# Patient Record
Sex: Female | Born: 1935 | Race: White | Hispanic: No
Health system: Southern US, Community
[De-identification: ages and names within clinical notes are randomized; demographics above are authoritative.]

## PROBLEM LIST (undated history)

## (undated) DIAGNOSIS — G458 Other transient cerebral ischemic attacks and related syndromes: Secondary | ICD-10-CM

## (undated) DIAGNOSIS — M1990 Unspecified osteoarthritis, unspecified site: Secondary | ICD-10-CM

## (undated) DIAGNOSIS — K5790 Diverticulosis of intestine, part unspecified, without perforation or abscess without bleeding: Secondary | ICD-10-CM

## (undated) DIAGNOSIS — I639 Cerebral infarction, unspecified: Secondary | ICD-10-CM

## (undated) DIAGNOSIS — M810 Age-related osteoporosis without current pathological fracture: Secondary | ICD-10-CM

## (undated) DIAGNOSIS — E669 Obesity, unspecified: Secondary | ICD-10-CM

## (undated) DIAGNOSIS — R600 Localized edema: Secondary | ICD-10-CM

## (undated) DIAGNOSIS — M48061 Spinal stenosis, lumbar region without neurogenic claudication: Secondary | ICD-10-CM

## (undated) DIAGNOSIS — C801 Malignant (primary) neoplasm, unspecified: Secondary | ICD-10-CM

## (undated) HISTORY — PX: COLONOSCOPY W/ POLYPECTOMY: SHX1380

## (undated) HISTORY — DX: Unspecified osteoarthritis, unspecified site: M19.90

## (undated) HISTORY — DX: Diverticulosis of intestine, part unspecified, without perforation or abscess without bleeding: K57.90

## (undated) HISTORY — DX: Malignant (primary) neoplasm, unspecified: C80.1

## (undated) HISTORY — DX: Other transient cerebral ischemic attacks and related syndromes: G45.8

## (undated) HISTORY — DX: Obesity, unspecified: E66.9

## (undated) HISTORY — DX: Age-related osteoporosis without current pathological fracture: M81.0

## (undated) HISTORY — DX: Localized edema: R60.0

## (undated) HISTORY — DX: Cerebral infarction, unspecified: I63.9

## (undated) HISTORY — DX: Spinal stenosis, lumbar region without neurogenic claudication: M48.061

---

## 1973-05-26 HISTORY — PX: BREAST BIOPSY: SHX20

## 2001-05-26 DIAGNOSIS — G458 Other transient cerebral ischemic attacks and related syndromes: Secondary | ICD-10-CM

## 2001-05-26 HISTORY — DX: Other transient cerebral ischemic attacks and related syndromes: G45.8

## 2005-09-23 ENCOUNTER — Ambulatory Visit: Payer: Self-pay | Admitting: Family Medicine

## 2007-12-07 ENCOUNTER — Ambulatory Visit: Payer: Self-pay | Admitting: Unknown Physician Specialty

## 2009-05-26 HISTORY — PX: JOINT REPLACEMENT: SHX530

## 2009-09-04 ENCOUNTER — Ambulatory Visit: Payer: Self-pay | Admitting: Family Medicine

## 2010-01-03 ENCOUNTER — Ambulatory Visit: Payer: Self-pay | Admitting: General Practice

## 2010-01-16 ENCOUNTER — Encounter: Admission: RE | Admit: 2010-01-16 | Discharge: 2010-01-16 | Payer: Self-pay | Admitting: Orthopedic Surgery

## 2010-02-11 ENCOUNTER — Ambulatory Visit: Payer: Self-pay | Admitting: General Practice

## 2010-02-22 ENCOUNTER — Ambulatory Visit: Payer: Self-pay | Admitting: Cardiovascular Disease

## 2010-02-25 ENCOUNTER — Inpatient Hospital Stay: Payer: Self-pay | Admitting: General Practice

## 2010-03-01 ENCOUNTER — Encounter: Payer: Self-pay | Admitting: Internal Medicine

## 2010-04-16 ENCOUNTER — Encounter: Admission: RE | Admit: 2010-04-16 | Discharge: 2010-04-16 | Payer: Self-pay | Admitting: Orthopedic Surgery

## 2010-06-26 ENCOUNTER — Other Ambulatory Visit: Payer: Self-pay | Admitting: Orthopedic Surgery

## 2010-06-26 DIAGNOSIS — M4800 Spinal stenosis, site unspecified: Secondary | ICD-10-CM

## 2010-07-09 ENCOUNTER — Ambulatory Visit
Admission: RE | Admit: 2010-07-09 | Discharge: 2010-07-09 | Disposition: A | Discharge status: Home / Self Care | Payer: Medicare Other | Source: Ambulatory Visit | Attending: Orthopedic Surgery | Admitting: Orthopedic Surgery

## 2010-07-09 ENCOUNTER — Other Ambulatory Visit: Payer: Self-pay

## 2010-07-09 DIAGNOSIS — M4800 Spinal stenosis, site unspecified: Secondary | ICD-10-CM

## 2010-10-01 ENCOUNTER — Ambulatory Visit: Payer: Self-pay | Admitting: Family Medicine

## 2011-02-19 ENCOUNTER — Other Ambulatory Visit: Payer: Self-pay | Admitting: Orthopedic Surgery

## 2011-02-19 DIAGNOSIS — M4800 Spinal stenosis, site unspecified: Secondary | ICD-10-CM

## 2011-02-25 ENCOUNTER — Ambulatory Visit
Admission: RE | Admit: 2011-02-25 | Discharge: 2011-02-25 | Disposition: A | Discharge status: Home / Self Care | Payer: Medicare Other | Source: Ambulatory Visit | Attending: Orthopedic Surgery | Admitting: Orthopedic Surgery

## 2011-02-25 DIAGNOSIS — M4800 Spinal stenosis, site unspecified: Secondary | ICD-10-CM

## 2011-02-25 MED ORDER — METHYLPREDNISOLONE ACETATE 40 MG/ML INJ SUSP (RADIOLOG
120.0000 mg | Freq: Once | INTRAMUSCULAR | Status: AC
  Administered 2011-02-25: 120 mg via EPIDURAL

## 2011-02-25 MED ORDER — IOHEXOL 180 MG/ML  SOLN
1.0000 mL | Freq: Once | INTRAMUSCULAR | Status: AC | PRN
  Administered 2011-02-25: 1 mL via EPIDURAL

## 2011-09-10 DIAGNOSIS — Z0000 Encounter for general adult medical examination without abnormal findings: Secondary | ICD-10-CM | POA: Diagnosis not present

## 2011-09-10 DIAGNOSIS — I10 Essential (primary) hypertension: Secondary | ICD-10-CM | POA: Diagnosis not present

## 2011-09-15 DIAGNOSIS — H2510 Age-related nuclear cataract, unspecified eye: Secondary | ICD-10-CM | POA: Diagnosis not present

## 2011-09-16 DIAGNOSIS — Z0000 Encounter for general adult medical examination without abnormal findings: Secondary | ICD-10-CM | POA: Diagnosis not present

## 2011-09-16 DIAGNOSIS — G458 Other transient cerebral ischemic attacks and related syndromes: Secondary | ICD-10-CM | POA: Diagnosis not present

## 2011-09-16 DIAGNOSIS — M48061 Spinal stenosis, lumbar region without neurogenic claudication: Secondary | ICD-10-CM | POA: Diagnosis not present

## 2011-10-02 ENCOUNTER — Ambulatory Visit: Payer: Self-pay | Admitting: Family Medicine

## 2011-10-02 DIAGNOSIS — Z1231 Encounter for screening mammogram for malignant neoplasm of breast: Secondary | ICD-10-CM | POA: Diagnosis not present

## 2011-10-14 ENCOUNTER — Ambulatory Visit: Payer: Self-pay | Admitting: Family Medicine

## 2011-10-14 DIAGNOSIS — N63 Unspecified lump in unspecified breast: Secondary | ICD-10-CM | POA: Diagnosis not present

## 2011-10-14 DIAGNOSIS — N6489 Other specified disorders of breast: Secondary | ICD-10-CM | POA: Diagnosis not present

## 2011-10-14 DIAGNOSIS — R928 Other abnormal and inconclusive findings on diagnostic imaging of breast: Secondary | ICD-10-CM | POA: Diagnosis not present

## 2011-10-30 DIAGNOSIS — N6019 Diffuse cystic mastopathy of unspecified breast: Secondary | ICD-10-CM | POA: Diagnosis not present

## 2011-10-30 DIAGNOSIS — N63 Unspecified lump in unspecified breast: Secondary | ICD-10-CM | POA: Diagnosis not present

## 2012-03-16 DIAGNOSIS — I10 Essential (primary) hypertension: Secondary | ICD-10-CM | POA: Diagnosis not present

## 2012-03-16 DIAGNOSIS — Z23 Encounter for immunization: Secondary | ICD-10-CM | POA: Diagnosis not present

## 2012-03-25 DIAGNOSIS — D1801 Hemangioma of skin and subcutaneous tissue: Secondary | ICD-10-CM | POA: Diagnosis not present

## 2012-03-25 DIAGNOSIS — L570 Actinic keratosis: Secondary | ICD-10-CM | POA: Diagnosis not present

## 2012-03-25 DIAGNOSIS — D234 Other benign neoplasm of skin of scalp and neck: Secondary | ICD-10-CM | POA: Diagnosis not present

## 2012-04-27 ENCOUNTER — Ambulatory Visit: Payer: Self-pay | Admitting: General Surgery

## 2012-04-27 DIAGNOSIS — N6019 Diffuse cystic mastopathy of unspecified breast: Secondary | ICD-10-CM | POA: Diagnosis not present

## 2012-04-27 DIAGNOSIS — N6489 Other specified disorders of breast: Secondary | ICD-10-CM | POA: Diagnosis not present

## 2012-05-05 ENCOUNTER — Ambulatory Visit: Payer: Self-pay | Admitting: Family Medicine

## 2012-05-05 DIAGNOSIS — M5137 Other intervertebral disc degeneration, lumbosacral region: Secondary | ICD-10-CM | POA: Diagnosis not present

## 2012-05-05 DIAGNOSIS — M47817 Spondylosis without myelopathy or radiculopathy, lumbosacral region: Secondary | ICD-10-CM | POA: Diagnosis not present

## 2012-05-05 DIAGNOSIS — M48061 Spinal stenosis, lumbar region without neurogenic claudication: Secondary | ICD-10-CM | POA: Diagnosis not present

## 2012-05-05 DIAGNOSIS — I10 Essential (primary) hypertension: Secondary | ICD-10-CM | POA: Diagnosis not present

## 2012-05-07 ENCOUNTER — Ambulatory Visit: Payer: Self-pay | Admitting: Family Medicine

## 2012-05-07 DIAGNOSIS — M5137 Other intervertebral disc degeneration, lumbosacral region: Secondary | ICD-10-CM | POA: Diagnosis not present

## 2012-05-07 DIAGNOSIS — M5126 Other intervertebral disc displacement, lumbar region: Secondary | ICD-10-CM | POA: Diagnosis not present

## 2012-05-11 ENCOUNTER — Other Ambulatory Visit: Payer: Self-pay | Admitting: Family Medicine

## 2012-05-11 DIAGNOSIS — M79604 Pain in right leg: Secondary | ICD-10-CM

## 2012-05-20 ENCOUNTER — Ambulatory Visit
Admission: RE | Admit: 2012-05-20 | Discharge: 2012-05-20 | Disposition: A | Discharge status: Home / Self Care | Payer: Medicare Other | Source: Ambulatory Visit | Attending: Family Medicine | Admitting: Family Medicine

## 2012-05-20 VITALS — BP 152/93 | HR 85

## 2012-05-20 DIAGNOSIS — M47817 Spondylosis without myelopathy or radiculopathy, lumbosacral region: Secondary | ICD-10-CM | POA: Diagnosis not present

## 2012-05-20 DIAGNOSIS — M79604 Pain in right leg: Secondary | ICD-10-CM

## 2012-05-20 DIAGNOSIS — M48061 Spinal stenosis, lumbar region without neurogenic claudication: Secondary | ICD-10-CM | POA: Diagnosis not present

## 2012-05-20 DIAGNOSIS — M5126 Other intervertebral disc displacement, lumbar region: Secondary | ICD-10-CM

## 2012-05-20 MED ORDER — IOHEXOL 180 MG/ML  SOLN
1.0000 mL | Freq: Once | INTRAMUSCULAR | Status: AC | PRN
  Administered 2012-05-20: 1 mL via EPIDURAL

## 2012-05-20 MED ORDER — METHYLPREDNISOLONE ACETATE 40 MG/ML INJ SUSP (RADIOLOG
120.0000 mg | Freq: Once | INTRAMUSCULAR | Status: AC
  Administered 2012-05-20: 120 mg via EPIDURAL

## 2012-06-09 DIAGNOSIS — M5137 Other intervertebral disc degeneration, lumbosacral region: Secondary | ICD-10-CM | POA: Diagnosis not present

## 2012-06-09 DIAGNOSIS — N6019 Diffuse cystic mastopathy of unspecified breast: Secondary | ICD-10-CM | POA: Diagnosis not present

## 2012-06-10 ENCOUNTER — Other Ambulatory Visit: Payer: Self-pay | Admitting: Family Medicine

## 2012-06-10 DIAGNOSIS — M549 Dorsalgia, unspecified: Secondary | ICD-10-CM

## 2012-06-23 ENCOUNTER — Inpatient Hospital Stay: Admission: RE | Admit: 2012-06-23 | Payer: Medicare Other | Source: Ambulatory Visit

## 2012-07-12 DIAGNOSIS — D485 Neoplasm of uncertain behavior of skin: Secondary | ICD-10-CM | POA: Diagnosis not present

## 2012-07-12 DIAGNOSIS — C4441 Basal cell carcinoma of skin of scalp and neck: Secondary | ICD-10-CM | POA: Diagnosis not present

## 2012-09-21 DIAGNOSIS — Z0000 Encounter for general adult medical examination without abnormal findings: Secondary | ICD-10-CM | POA: Diagnosis not present

## 2012-09-21 DIAGNOSIS — I10 Essential (primary) hypertension: Secondary | ICD-10-CM | POA: Diagnosis not present

## 2012-10-19 DIAGNOSIS — R234 Changes in skin texture: Secondary | ICD-10-CM | POA: Diagnosis not present

## 2012-11-30 DIAGNOSIS — R21 Rash and other nonspecific skin eruption: Secondary | ICD-10-CM | POA: Diagnosis not present

## 2012-11-30 DIAGNOSIS — R42 Dizziness and giddiness: Secondary | ICD-10-CM | POA: Diagnosis not present

## 2012-12-21 DIAGNOSIS — R262 Difficulty in walking, not elsewhere classified: Secondary | ICD-10-CM | POA: Diagnosis not present

## 2012-12-21 DIAGNOSIS — M48061 Spinal stenosis, lumbar region without neurogenic claudication: Secondary | ICD-10-CM | POA: Diagnosis not present

## 2012-12-24 DIAGNOSIS — M48061 Spinal stenosis, lumbar region without neurogenic claudication: Secondary | ICD-10-CM | POA: Diagnosis not present

## 2012-12-24 DIAGNOSIS — R262 Difficulty in walking, not elsewhere classified: Secondary | ICD-10-CM | POA: Diagnosis not present

## 2013-01-24 DIAGNOSIS — M48061 Spinal stenosis, lumbar region without neurogenic claudication: Secondary | ICD-10-CM | POA: Diagnosis not present

## 2013-01-24 DIAGNOSIS — R262 Difficulty in walking, not elsewhere classified: Secondary | ICD-10-CM | POA: Diagnosis not present

## 2013-01-25 DIAGNOSIS — M48061 Spinal stenosis, lumbar region without neurogenic claudication: Secondary | ICD-10-CM | POA: Diagnosis not present

## 2013-01-25 DIAGNOSIS — R262 Difficulty in walking, not elsewhere classified: Secondary | ICD-10-CM | POA: Diagnosis not present

## 2013-01-27 DIAGNOSIS — Z23 Encounter for immunization: Secondary | ICD-10-CM | POA: Diagnosis not present

## 2013-01-27 DIAGNOSIS — R234 Changes in skin texture: Secondary | ICD-10-CM | POA: Diagnosis not present

## 2013-01-27 DIAGNOSIS — I10 Essential (primary) hypertension: Secondary | ICD-10-CM | POA: Diagnosis not present

## 2013-01-31 DIAGNOSIS — R262 Difficulty in walking, not elsewhere classified: Secondary | ICD-10-CM | POA: Diagnosis not present

## 2013-01-31 DIAGNOSIS — M48061 Spinal stenosis, lumbar region without neurogenic claudication: Secondary | ICD-10-CM | POA: Diagnosis not present

## 2013-02-02 DIAGNOSIS — C4441 Basal cell carcinoma of skin of scalp and neck: Secondary | ICD-10-CM | POA: Diagnosis not present

## 2013-02-02 DIAGNOSIS — D485 Neoplasm of uncertain behavior of skin: Secondary | ICD-10-CM | POA: Diagnosis not present

## 2013-02-04 DIAGNOSIS — R262 Difficulty in walking, not elsewhere classified: Secondary | ICD-10-CM | POA: Diagnosis not present

## 2013-02-04 DIAGNOSIS — M48061 Spinal stenosis, lumbar region without neurogenic claudication: Secondary | ICD-10-CM | POA: Diagnosis not present

## 2013-02-08 DIAGNOSIS — R262 Difficulty in walking, not elsewhere classified: Secondary | ICD-10-CM | POA: Diagnosis not present

## 2013-02-08 DIAGNOSIS — M48061 Spinal stenosis, lumbar region without neurogenic claudication: Secondary | ICD-10-CM | POA: Diagnosis not present

## 2013-02-11 DIAGNOSIS — R262 Difficulty in walking, not elsewhere classified: Secondary | ICD-10-CM | POA: Diagnosis not present

## 2013-02-11 DIAGNOSIS — M48061 Spinal stenosis, lumbar region without neurogenic claudication: Secondary | ICD-10-CM | POA: Diagnosis not present

## 2013-02-15 DIAGNOSIS — R262 Difficulty in walking, not elsewhere classified: Secondary | ICD-10-CM | POA: Diagnosis not present

## 2013-02-15 DIAGNOSIS — M48061 Spinal stenosis, lumbar region without neurogenic claudication: Secondary | ICD-10-CM | POA: Diagnosis not present

## 2013-02-18 DIAGNOSIS — R262 Difficulty in walking, not elsewhere classified: Secondary | ICD-10-CM | POA: Diagnosis not present

## 2013-02-18 DIAGNOSIS — M48061 Spinal stenosis, lumbar region without neurogenic claudication: Secondary | ICD-10-CM | POA: Diagnosis not present

## 2013-02-22 DIAGNOSIS — M48061 Spinal stenosis, lumbar region without neurogenic claudication: Secondary | ICD-10-CM | POA: Diagnosis not present

## 2013-02-22 DIAGNOSIS — R262 Difficulty in walking, not elsewhere classified: Secondary | ICD-10-CM | POA: Diagnosis not present

## 2013-02-23 DIAGNOSIS — M48061 Spinal stenosis, lumbar region without neurogenic claudication: Secondary | ICD-10-CM | POA: Diagnosis not present

## 2013-02-23 DIAGNOSIS — C4441 Basal cell carcinoma of skin of scalp and neck: Secondary | ICD-10-CM | POA: Diagnosis not present

## 2013-02-23 DIAGNOSIS — R262 Difficulty in walking, not elsewhere classified: Secondary | ICD-10-CM | POA: Diagnosis not present

## 2013-03-26 DIAGNOSIS — R262 Difficulty in walking, not elsewhere classified: Secondary | ICD-10-CM | POA: Diagnosis not present

## 2013-03-26 DIAGNOSIS — M48061 Spinal stenosis, lumbar region without neurogenic claudication: Secondary | ICD-10-CM | POA: Diagnosis not present

## 2013-04-25 DIAGNOSIS — M48061 Spinal stenosis, lumbar region without neurogenic claudication: Secondary | ICD-10-CM | POA: Diagnosis not present

## 2013-04-25 DIAGNOSIS — R262 Difficulty in walking, not elsewhere classified: Secondary | ICD-10-CM | POA: Diagnosis not present

## 2013-05-02 DIAGNOSIS — I10 Essential (primary) hypertension: Secondary | ICD-10-CM | POA: Diagnosis not present

## 2013-05-02 DIAGNOSIS — R609 Edema, unspecified: Secondary | ICD-10-CM | POA: Diagnosis not present

## 2013-05-02 DIAGNOSIS — R234 Changes in skin texture: Secondary | ICD-10-CM | POA: Diagnosis not present

## 2013-05-26 DIAGNOSIS — M48061 Spinal stenosis, lumbar region without neurogenic claudication: Secondary | ICD-10-CM | POA: Diagnosis not present

## 2013-05-26 DIAGNOSIS — R262 Difficulty in walking, not elsewhere classified: Secondary | ICD-10-CM | POA: Diagnosis not present

## 2013-06-26 DIAGNOSIS — R262 Difficulty in walking, not elsewhere classified: Secondary | ICD-10-CM | POA: Diagnosis not present

## 2013-06-26 DIAGNOSIS — M48061 Spinal stenosis, lumbar region without neurogenic claudication: Secondary | ICD-10-CM | POA: Diagnosis not present

## 2013-07-18 DIAGNOSIS — H2510 Age-related nuclear cataract, unspecified eye: Secondary | ICD-10-CM | POA: Diagnosis not present

## 2013-07-24 DIAGNOSIS — M48061 Spinal stenosis, lumbar region without neurogenic claudication: Secondary | ICD-10-CM | POA: Diagnosis not present

## 2013-07-24 DIAGNOSIS — R262 Difficulty in walking, not elsewhere classified: Secondary | ICD-10-CM | POA: Diagnosis not present

## 2013-07-27 DIAGNOSIS — M48061 Spinal stenosis, lumbar region without neurogenic claudication: Secondary | ICD-10-CM | POA: Diagnosis not present

## 2013-07-27 DIAGNOSIS — R262 Difficulty in walking, not elsewhere classified: Secondary | ICD-10-CM | POA: Diagnosis not present

## 2013-07-29 DIAGNOSIS — M48061 Spinal stenosis, lumbar region without neurogenic claudication: Secondary | ICD-10-CM | POA: Diagnosis not present

## 2013-07-29 DIAGNOSIS — R262 Difficulty in walking, not elsewhere classified: Secondary | ICD-10-CM | POA: Diagnosis not present

## 2013-08-03 DIAGNOSIS — M48061 Spinal stenosis, lumbar region without neurogenic claudication: Secondary | ICD-10-CM | POA: Diagnosis not present

## 2013-08-03 DIAGNOSIS — R262 Difficulty in walking, not elsewhere classified: Secondary | ICD-10-CM | POA: Diagnosis not present

## 2013-08-05 DIAGNOSIS — M48061 Spinal stenosis, lumbar region without neurogenic claudication: Secondary | ICD-10-CM | POA: Diagnosis not present

## 2013-08-05 DIAGNOSIS — R262 Difficulty in walking, not elsewhere classified: Secondary | ICD-10-CM | POA: Diagnosis not present

## 2013-08-09 DIAGNOSIS — M48061 Spinal stenosis, lumbar region without neurogenic claudication: Secondary | ICD-10-CM | POA: Diagnosis not present

## 2013-08-09 DIAGNOSIS — R262 Difficulty in walking, not elsewhere classified: Secondary | ICD-10-CM | POA: Diagnosis not present

## 2013-08-12 DIAGNOSIS — M48061 Spinal stenosis, lumbar region without neurogenic claudication: Secondary | ICD-10-CM | POA: Diagnosis not present

## 2013-08-12 DIAGNOSIS — R262 Difficulty in walking, not elsewhere classified: Secondary | ICD-10-CM | POA: Diagnosis not present

## 2013-09-05 DIAGNOSIS — H25019 Cortical age-related cataract, unspecified eye: Secondary | ICD-10-CM | POA: Diagnosis not present

## 2013-09-05 DIAGNOSIS — H2510 Age-related nuclear cataract, unspecified eye: Secondary | ICD-10-CM | POA: Diagnosis not present

## 2013-09-05 DIAGNOSIS — H43819 Vitreous degeneration, unspecified eye: Secondary | ICD-10-CM | POA: Diagnosis not present

## 2013-09-05 DIAGNOSIS — H524 Presbyopia: Secondary | ICD-10-CM | POA: Diagnosis not present

## 2013-09-26 DIAGNOSIS — Z0000 Encounter for general adult medical examination without abnormal findings: Secondary | ICD-10-CM | POA: Diagnosis not present

## 2013-09-26 DIAGNOSIS — I10 Essential (primary) hypertension: Secondary | ICD-10-CM | POA: Diagnosis not present

## 2014-04-10 DIAGNOSIS — G458 Other transient cerebral ischemic attacks and related syndromes: Secondary | ICD-10-CM | POA: Diagnosis not present

## 2014-04-10 DIAGNOSIS — Z23 Encounter for immunization: Secondary | ICD-10-CM | POA: Diagnosis not present

## 2014-04-10 DIAGNOSIS — I10 Essential (primary) hypertension: Secondary | ICD-10-CM | POA: Diagnosis not present

## 2014-04-10 DIAGNOSIS — M810 Age-related osteoporosis without current pathological fracture: Secondary | ICD-10-CM | POA: Diagnosis not present

## 2014-04-10 DIAGNOSIS — M1990 Unspecified osteoarthritis, unspecified site: Secondary | ICD-10-CM | POA: Diagnosis not present

## 2014-05-26 HISTORY — PX: EYE SURGERY: SHX253

## 2014-05-29 DIAGNOSIS — M25511 Pain in right shoulder: Secondary | ICD-10-CM | POA: Diagnosis not present

## 2014-06-08 DIAGNOSIS — M7541 Impingement syndrome of right shoulder: Secondary | ICD-10-CM | POA: Diagnosis not present

## 2014-07-20 DIAGNOSIS — H5703 Miosis: Secondary | ICD-10-CM | POA: Diagnosis not present

## 2014-07-20 DIAGNOSIS — H2513 Age-related nuclear cataract, bilateral: Secondary | ICD-10-CM | POA: Diagnosis not present

## 2014-07-20 DIAGNOSIS — H25013 Cortical age-related cataract, bilateral: Secondary | ICD-10-CM | POA: Diagnosis not present

## 2014-07-20 DIAGNOSIS — H524 Presbyopia: Secondary | ICD-10-CM | POA: Diagnosis not present

## 2014-08-15 DIAGNOSIS — H2512 Age-related nuclear cataract, left eye: Secondary | ICD-10-CM | POA: Diagnosis not present

## 2014-08-21 DIAGNOSIS — H2512 Age-related nuclear cataract, left eye: Secondary | ICD-10-CM | POA: Diagnosis not present

## 2014-09-13 DIAGNOSIS — H25011 Cortical age-related cataract, right eye: Secondary | ICD-10-CM | POA: Diagnosis not present

## 2014-09-13 DIAGNOSIS — H2511 Age-related nuclear cataract, right eye: Secondary | ICD-10-CM | POA: Diagnosis not present

## 2014-09-15 DIAGNOSIS — R531 Weakness: Secondary | ICD-10-CM | POA: Diagnosis not present

## 2014-09-15 DIAGNOSIS — I890 Lymphedema, not elsewhere classified: Secondary | ICD-10-CM | POA: Diagnosis not present

## 2014-09-15 DIAGNOSIS — R262 Difficulty in walking, not elsewhere classified: Secondary | ICD-10-CM | POA: Diagnosis not present

## 2014-09-22 DIAGNOSIS — R262 Difficulty in walking, not elsewhere classified: Secondary | ICD-10-CM | POA: Diagnosis not present

## 2014-09-22 DIAGNOSIS — R531 Weakness: Secondary | ICD-10-CM | POA: Diagnosis not present

## 2014-09-22 DIAGNOSIS — I890 Lymphedema, not elsewhere classified: Secondary | ICD-10-CM | POA: Diagnosis not present

## 2014-09-25 DIAGNOSIS — R531 Weakness: Secondary | ICD-10-CM | POA: Diagnosis not present

## 2014-09-25 DIAGNOSIS — R262 Difficulty in walking, not elsewhere classified: Secondary | ICD-10-CM | POA: Diagnosis not present

## 2014-09-25 DIAGNOSIS — I890 Lymphedema, not elsewhere classified: Secondary | ICD-10-CM | POA: Diagnosis not present

## 2014-09-28 DIAGNOSIS — M1990 Unspecified osteoarthritis, unspecified site: Secondary | ICD-10-CM | POA: Diagnosis not present

## 2014-09-28 DIAGNOSIS — I890 Lymphedema, not elsewhere classified: Secondary | ICD-10-CM | POA: Diagnosis not present

## 2014-09-28 DIAGNOSIS — Z0000 Encounter for general adult medical examination without abnormal findings: Secondary | ICD-10-CM | POA: Diagnosis not present

## 2014-09-28 DIAGNOSIS — G458 Other transient cerebral ischemic attacks and related syndromes: Secondary | ICD-10-CM | POA: Diagnosis not present

## 2014-09-28 DIAGNOSIS — R262 Difficulty in walking, not elsewhere classified: Secondary | ICD-10-CM | POA: Diagnosis not present

## 2014-09-28 DIAGNOSIS — I10 Essential (primary) hypertension: Secondary | ICD-10-CM | POA: Diagnosis not present

## 2014-09-28 DIAGNOSIS — R531 Weakness: Secondary | ICD-10-CM | POA: Diagnosis not present

## 2014-09-28 DIAGNOSIS — E662 Morbid (severe) obesity with alveolar hypoventilation: Secondary | ICD-10-CM | POA: Diagnosis not present

## 2014-10-04 DIAGNOSIS — I890 Lymphedema, not elsewhere classified: Secondary | ICD-10-CM | POA: Diagnosis not present

## 2014-10-04 DIAGNOSIS — R262 Difficulty in walking, not elsewhere classified: Secondary | ICD-10-CM | POA: Diagnosis not present

## 2014-10-04 DIAGNOSIS — R531 Weakness: Secondary | ICD-10-CM | POA: Diagnosis not present

## 2014-10-09 DIAGNOSIS — R262 Difficulty in walking, not elsewhere classified: Secondary | ICD-10-CM | POA: Diagnosis not present

## 2014-10-09 DIAGNOSIS — M7552 Bursitis of left shoulder: Secondary | ICD-10-CM | POA: Diagnosis not present

## 2014-10-09 DIAGNOSIS — R531 Weakness: Secondary | ICD-10-CM | POA: Diagnosis not present

## 2014-10-09 DIAGNOSIS — I890 Lymphedema, not elsewhere classified: Secondary | ICD-10-CM | POA: Diagnosis not present

## 2014-10-12 DIAGNOSIS — R262 Difficulty in walking, not elsewhere classified: Secondary | ICD-10-CM | POA: Diagnosis not present

## 2014-10-12 DIAGNOSIS — I890 Lymphedema, not elsewhere classified: Secondary | ICD-10-CM | POA: Diagnosis not present

## 2014-10-12 DIAGNOSIS — R531 Weakness: Secondary | ICD-10-CM | POA: Diagnosis not present

## 2014-10-16 DIAGNOSIS — R531 Weakness: Secondary | ICD-10-CM | POA: Diagnosis not present

## 2014-10-16 DIAGNOSIS — I890 Lymphedema, not elsewhere classified: Secondary | ICD-10-CM | POA: Diagnosis not present

## 2014-10-16 DIAGNOSIS — R262 Difficulty in walking, not elsewhere classified: Secondary | ICD-10-CM | POA: Diagnosis not present

## 2014-10-17 DIAGNOSIS — I890 Lymphedema, not elsewhere classified: Secondary | ICD-10-CM | POA: Diagnosis not present

## 2014-10-17 DIAGNOSIS — R531 Weakness: Secondary | ICD-10-CM | POA: Diagnosis not present

## 2014-10-17 DIAGNOSIS — R262 Difficulty in walking, not elsewhere classified: Secondary | ICD-10-CM | POA: Diagnosis not present

## 2014-10-19 DIAGNOSIS — R262 Difficulty in walking, not elsewhere classified: Secondary | ICD-10-CM | POA: Diagnosis not present

## 2014-10-19 DIAGNOSIS — R531 Weakness: Secondary | ICD-10-CM | POA: Diagnosis not present

## 2014-10-19 DIAGNOSIS — I890 Lymphedema, not elsewhere classified: Secondary | ICD-10-CM | POA: Diagnosis not present

## 2014-10-24 DIAGNOSIS — R531 Weakness: Secondary | ICD-10-CM | POA: Diagnosis not present

## 2014-10-24 DIAGNOSIS — I890 Lymphedema, not elsewhere classified: Secondary | ICD-10-CM | POA: Diagnosis not present

## 2014-10-24 DIAGNOSIS — R262 Difficulty in walking, not elsewhere classified: Secondary | ICD-10-CM | POA: Diagnosis not present

## 2014-10-26 DIAGNOSIS — R531 Weakness: Secondary | ICD-10-CM | POA: Diagnosis not present

## 2014-10-26 DIAGNOSIS — R262 Difficulty in walking, not elsewhere classified: Secondary | ICD-10-CM | POA: Diagnosis not present

## 2014-10-26 DIAGNOSIS — I890 Lymphedema, not elsewhere classified: Secondary | ICD-10-CM | POA: Diagnosis not present

## 2014-10-31 DIAGNOSIS — H2511 Age-related nuclear cataract, right eye: Secondary | ICD-10-CM | POA: Diagnosis not present

## 2014-11-08 ENCOUNTER — Telehealth: Payer: Self-pay | Admitting: Family Medicine

## 2014-11-08 MED ORDER — RAMIPRIL 2.5 MG PO CAPS: 2.5000 mg | ORAL_CAPSULE | Freq: Every day | ORAL | Status: DC

## 2014-11-08 NOTE — Telephone Encounter (Signed)
Patient called and said she has called twice and no call back has been made. She said she will like to talk to you Izora Gala regarding an issue about BP problem. Please call pt back [phone], thanks.

## 2014-11-10 ENCOUNTER — Telehealth: Payer: Self-pay | Admitting: Family Medicine

## 2014-11-10 ENCOUNTER — Telehealth: Payer: Self-pay

## 2014-11-10 DIAGNOSIS — W57XXXA Bitten or stung by nonvenomous insect and other nonvenomous arthropods, initial encounter: Secondary | ICD-10-CM | POA: Diagnosis not present

## 2014-11-10 DIAGNOSIS — R531 Weakness: Secondary | ICD-10-CM | POA: Diagnosis not present

## 2014-11-10 DIAGNOSIS — S30861A Insect bite (nonvenomous) of abdominal wall, initial encounter: Secondary | ICD-10-CM | POA: Diagnosis not present

## 2014-11-10 MED ORDER — RAMIPRIL 2.5 MG PO CAPS: 2.5000 mg | ORAL_CAPSULE | Freq: Every day | ORAL | Status: DC

## 2014-11-10 NOTE — Telephone Encounter (Signed)
Call was returned to patient, no answer, left message that I checked her chart, her Altace (Ramipril) was filled by MAC on 11/08/14 and sent to [facility].  Advised to contact that pharmacy to see when to expect delivery.

## 2014-11-10 NOTE — Telephone Encounter (Signed)
Rx sent to [facility]

## 2014-11-10 NOTE — Telephone Encounter (Signed)
Pt called and states she needs a refill on BP meds. Pharm is [facility], ask pharmacy to deliver to her home.

## 2014-11-10 NOTE — Telephone Encounter (Signed)
Patient went to another office today where they considered her BP to be too low and told the patient to NOT start taking the Ramapril again.  She is concerned that her BP goes up and down leaving her feeling very tired.  [place]  Please advise if she needs to be worked in or just a phone call will do.

## 2014-11-10 NOTE — Telephone Encounter (Signed)
Patients daughter understands Rx was sent to mail order, patient is out today and they are requesting a fill in Rx for her Ramipril be sent to [facility] today.

## 2014-11-13 NOTE — Telephone Encounter (Signed)
MAC spoke with daughter [name] personally, no need for call.

## 2014-11-13 NOTE — Telephone Encounter (Signed)
Call pt 

## 2014-11-14 DIAGNOSIS — R531 Weakness: Secondary | ICD-10-CM | POA: Diagnosis not present

## 2014-11-14 DIAGNOSIS — I890 Lymphedema, not elsewhere classified: Secondary | ICD-10-CM | POA: Diagnosis not present

## 2014-11-14 DIAGNOSIS — R262 Difficulty in walking, not elsewhere classified: Secondary | ICD-10-CM | POA: Diagnosis not present

## 2014-11-22 ENCOUNTER — Telehealth: Payer: Self-pay

## 2014-11-22 DIAGNOSIS — Z7189 Other specified counseling: Secondary | ICD-10-CM

## 2014-11-22 NOTE — Telephone Encounter (Signed)
Would like to discuss in home Occupational Therapy

## 2014-11-22 NOTE — Telephone Encounter (Signed)
Try again

## 2014-11-23 NOTE — Telephone Encounter (Signed)
Will get occupational therapy to review about maintaining home care

## 2014-11-24 ENCOUNTER — Telehealth: Payer: Self-pay | Admitting: Family Medicine

## 2014-11-24 NOTE — Telephone Encounter (Signed)
Paperwork filled out and signed by MAC for [employer]

## 2014-11-24 NOTE — Telephone Encounter (Signed)
MS HOLT CALLED TO INFORM us OF 2 PLACES THAT OFFERED IN HOME OCCUPATIONAL THERAPY FOR Jennifer Murray. THE 2 PLACES ARE [facility]([phone]) AND THE OTHER PLACE IS [facility]([phone])

## 2014-12-11 ENCOUNTER — Telehealth: Payer: Self-pay

## 2014-12-11 ENCOUNTER — Telehealth: Payer: Self-pay | Admitting: Family Medicine

## 2014-12-11 DIAGNOSIS — M4806 Spinal stenosis, lumbar region: Secondary | ICD-10-CM | POA: Diagnosis not present

## 2014-12-11 DIAGNOSIS — M159 Polyosteoarthritis, unspecified: Secondary | ICD-10-CM | POA: Diagnosis not present

## 2014-12-11 DIAGNOSIS — M6281 Muscle weakness (generalized): Secondary | ICD-10-CM | POA: Diagnosis not present

## 2014-12-11 DIAGNOSIS — Z8673 Personal history of transient ischemic attack (TIA), and cerebral infarction without residual deficits: Secondary | ICD-10-CM | POA: Diagnosis not present

## 2014-12-11 DIAGNOSIS — M810 Age-related osteoporosis without current pathological fracture: Secondary | ICD-10-CM | POA: Diagnosis not present

## 2014-12-11 DIAGNOSIS — I10 Essential (primary) hypertension: Secondary | ICD-10-CM | POA: Diagnosis not present

## 2014-12-11 NOTE — Telephone Encounter (Signed)
Phone call with patient has some questions about taken Ramipril 2.5 mg discussed to take it every day.

## 2014-12-13 ENCOUNTER — Telehealth: Payer: Self-pay

## 2014-12-13 NOTE — Telephone Encounter (Signed)
Called to give verbal order to PT-[facility] @ [phone]. [place] for PT 2x wk also ok for OT- skilled nursing and medical social work.

## 2014-12-14 DIAGNOSIS — M4806 Spinal stenosis, lumbar region: Secondary | ICD-10-CM | POA: Diagnosis not present

## 2014-12-14 DIAGNOSIS — M810 Age-related osteoporosis without current pathological fracture: Secondary | ICD-10-CM | POA: Diagnosis not present

## 2014-12-14 DIAGNOSIS — M6281 Muscle weakness (generalized): Secondary | ICD-10-CM | POA: Diagnosis not present

## 2014-12-14 DIAGNOSIS — M159 Polyosteoarthritis, unspecified: Secondary | ICD-10-CM | POA: Diagnosis not present

## 2014-12-14 DIAGNOSIS — Z8673 Personal history of transient ischemic attack (TIA), and cerebral infarction without residual deficits: Secondary | ICD-10-CM | POA: Diagnosis not present

## 2014-12-14 DIAGNOSIS — I10 Essential (primary) hypertension: Secondary | ICD-10-CM | POA: Diagnosis not present

## 2014-12-14 NOTE — Telephone Encounter (Signed)
Mac spoke with patient

## 2014-12-16 DIAGNOSIS — M159 Polyosteoarthritis, unspecified: Secondary | ICD-10-CM | POA: Diagnosis not present

## 2014-12-16 DIAGNOSIS — I10 Essential (primary) hypertension: Secondary | ICD-10-CM | POA: Diagnosis not present

## 2014-12-16 DIAGNOSIS — M810 Age-related osteoporosis without current pathological fracture: Secondary | ICD-10-CM | POA: Diagnosis not present

## 2014-12-16 DIAGNOSIS — Z8673 Personal history of transient ischemic attack (TIA), and cerebral infarction without residual deficits: Secondary | ICD-10-CM | POA: Diagnosis not present

## 2014-12-16 DIAGNOSIS — M4806 Spinal stenosis, lumbar region: Secondary | ICD-10-CM | POA: Diagnosis not present

## 2014-12-16 DIAGNOSIS — M6281 Muscle weakness (generalized): Secondary | ICD-10-CM | POA: Diagnosis not present

## 2014-12-19 ENCOUNTER — Telehealth: Payer: Self-pay

## 2014-12-19 DIAGNOSIS — M810 Age-related osteoporosis without current pathological fracture: Secondary | ICD-10-CM | POA: Diagnosis not present

## 2014-12-19 DIAGNOSIS — M6281 Muscle weakness (generalized): Secondary | ICD-10-CM | POA: Diagnosis not present

## 2014-12-19 DIAGNOSIS — M159 Polyosteoarthritis, unspecified: Secondary | ICD-10-CM | POA: Diagnosis not present

## 2014-12-19 DIAGNOSIS — Z8673 Personal history of transient ischemic attack (TIA), and cerebral infarction without residual deficits: Secondary | ICD-10-CM | POA: Diagnosis not present

## 2014-12-19 DIAGNOSIS — M4806 Spinal stenosis, lumbar region: Secondary | ICD-10-CM | POA: Diagnosis not present

## 2014-12-19 DIAGNOSIS — I10 Essential (primary) hypertension: Secondary | ICD-10-CM | POA: Diagnosis not present

## 2014-12-19 NOTE — Telephone Encounter (Signed)
Verbal called in to [facility]

## 2014-12-19 NOTE — Telephone Encounter (Signed)
Millie, Medical Social Worker with [facility] would like a verbal ok for one follow up visit for community resources For patient,  Call back [phone]

## 2014-12-19 NOTE — Telephone Encounter (Signed)
OK to do that. Thanks!

## 2014-12-20 ENCOUNTER — Telehealth: Payer: Self-pay

## 2014-12-20 DIAGNOSIS — I10 Essential (primary) hypertension: Secondary | ICD-10-CM | POA: Diagnosis not present

## 2014-12-20 DIAGNOSIS — M6281 Muscle weakness (generalized): Secondary | ICD-10-CM | POA: Diagnosis not present

## 2014-12-20 DIAGNOSIS — M810 Age-related osteoporosis without current pathological fracture: Secondary | ICD-10-CM | POA: Diagnosis not present

## 2014-12-20 DIAGNOSIS — M159 Polyosteoarthritis, unspecified: Secondary | ICD-10-CM | POA: Diagnosis not present

## 2014-12-20 DIAGNOSIS — M4806 Spinal stenosis, lumbar region: Secondary | ICD-10-CM | POA: Diagnosis not present

## 2014-12-20 DIAGNOSIS — Z8673 Personal history of transient ischemic attack (TIA), and cerebral infarction without residual deficits: Secondary | ICD-10-CM | POA: Diagnosis not present

## 2014-12-20 NOTE — Telephone Encounter (Signed)
Verbal ok for OT 2w x3 given by MJ

## 2014-12-21 DIAGNOSIS — Z8673 Personal history of transient ischemic attack (TIA), and cerebral infarction without residual deficits: Secondary | ICD-10-CM | POA: Diagnosis not present

## 2014-12-21 DIAGNOSIS — M4806 Spinal stenosis, lumbar region: Secondary | ICD-10-CM | POA: Diagnosis not present

## 2014-12-21 DIAGNOSIS — M810 Age-related osteoporosis without current pathological fracture: Secondary | ICD-10-CM | POA: Diagnosis not present

## 2014-12-21 DIAGNOSIS — M159 Polyosteoarthritis, unspecified: Secondary | ICD-10-CM | POA: Diagnosis not present

## 2014-12-21 DIAGNOSIS — M6281 Muscle weakness (generalized): Secondary | ICD-10-CM | POA: Diagnosis not present

## 2014-12-21 DIAGNOSIS — I10 Essential (primary) hypertension: Secondary | ICD-10-CM | POA: Diagnosis not present

## 2014-12-22 DIAGNOSIS — M159 Polyosteoarthritis, unspecified: Secondary | ICD-10-CM | POA: Diagnosis not present

## 2014-12-22 DIAGNOSIS — M810 Age-related osteoporosis without current pathological fracture: Secondary | ICD-10-CM | POA: Diagnosis not present

## 2014-12-22 DIAGNOSIS — M6281 Muscle weakness (generalized): Secondary | ICD-10-CM | POA: Diagnosis not present

## 2014-12-22 DIAGNOSIS — I10 Essential (primary) hypertension: Secondary | ICD-10-CM | POA: Diagnosis not present

## 2014-12-22 DIAGNOSIS — M4806 Spinal stenosis, lumbar region: Secondary | ICD-10-CM | POA: Diagnosis not present

## 2014-12-22 DIAGNOSIS — Z8673 Personal history of transient ischemic attack (TIA), and cerebral infarction without residual deficits: Secondary | ICD-10-CM | POA: Diagnosis not present

## 2014-12-25 DIAGNOSIS — R0602 Shortness of breath: Secondary | ICD-10-CM | POA: Diagnosis not present

## 2014-12-25 DIAGNOSIS — I10 Essential (primary) hypertension: Secondary | ICD-10-CM | POA: Diagnosis not present

## 2014-12-25 DIAGNOSIS — I6523 Occlusion and stenosis of bilateral carotid arteries: Secondary | ICD-10-CM | POA: Insufficient documentation

## 2014-12-25 DIAGNOSIS — R600 Localized edema: Secondary | ICD-10-CM | POA: Diagnosis not present

## 2014-12-26 DIAGNOSIS — M4806 Spinal stenosis, lumbar region: Secondary | ICD-10-CM | POA: Diagnosis not present

## 2014-12-26 DIAGNOSIS — M810 Age-related osteoporosis without current pathological fracture: Secondary | ICD-10-CM | POA: Diagnosis not present

## 2014-12-26 DIAGNOSIS — M159 Polyosteoarthritis, unspecified: Secondary | ICD-10-CM | POA: Diagnosis not present

## 2014-12-26 DIAGNOSIS — I10 Essential (primary) hypertension: Secondary | ICD-10-CM | POA: Diagnosis not present

## 2014-12-26 DIAGNOSIS — Z8673 Personal history of transient ischemic attack (TIA), and cerebral infarction without residual deficits: Secondary | ICD-10-CM | POA: Diagnosis not present

## 2014-12-26 DIAGNOSIS — M6281 Muscle weakness (generalized): Secondary | ICD-10-CM | POA: Diagnosis not present

## 2014-12-27 DIAGNOSIS — M19012 Primary osteoarthritis, left shoulder: Secondary | ICD-10-CM | POA: Diagnosis not present

## 2014-12-27 DIAGNOSIS — M25512 Pain in left shoulder: Secondary | ICD-10-CM | POA: Diagnosis not present

## 2014-12-28 DIAGNOSIS — Z8673 Personal history of transient ischemic attack (TIA), and cerebral infarction without residual deficits: Secondary | ICD-10-CM | POA: Diagnosis not present

## 2014-12-28 DIAGNOSIS — M4806 Spinal stenosis, lumbar region: Secondary | ICD-10-CM | POA: Diagnosis not present

## 2014-12-28 DIAGNOSIS — M810 Age-related osteoporosis without current pathological fracture: Secondary | ICD-10-CM | POA: Diagnosis not present

## 2014-12-28 DIAGNOSIS — M159 Polyosteoarthritis, unspecified: Secondary | ICD-10-CM | POA: Diagnosis not present

## 2014-12-28 DIAGNOSIS — M6281 Muscle weakness (generalized): Secondary | ICD-10-CM | POA: Diagnosis not present

## 2014-12-28 DIAGNOSIS — I10 Essential (primary) hypertension: Secondary | ICD-10-CM | POA: Diagnosis not present

## 2015-01-02 DIAGNOSIS — M6281 Muscle weakness (generalized): Secondary | ICD-10-CM | POA: Diagnosis not present

## 2015-01-02 DIAGNOSIS — M810 Age-related osteoporosis without current pathological fracture: Secondary | ICD-10-CM | POA: Diagnosis not present

## 2015-01-02 DIAGNOSIS — I10 Essential (primary) hypertension: Secondary | ICD-10-CM | POA: Diagnosis not present

## 2015-01-02 DIAGNOSIS — M4806 Spinal stenosis, lumbar region: Secondary | ICD-10-CM | POA: Diagnosis not present

## 2015-01-02 DIAGNOSIS — M159 Polyosteoarthritis, unspecified: Secondary | ICD-10-CM | POA: Diagnosis not present

## 2015-01-02 DIAGNOSIS — Z8673 Personal history of transient ischemic attack (TIA), and cerebral infarction without residual deficits: Secondary | ICD-10-CM | POA: Diagnosis not present

## 2015-01-09 NOTE — Telephone Encounter (Signed)
Verbal ok for continuation x3 visits called in

## 2015-01-09 NOTE — Telephone Encounter (Signed)
   Expand All Collapse All   Needs new verbal order for 3 visits. Please call [place]       This is okay

## 2015-01-09 NOTE — Telephone Encounter (Signed)
Needs new verbal order for 3 visits.  Please call [place]

## 2015-01-10 DIAGNOSIS — I10 Essential (primary) hypertension: Secondary | ICD-10-CM | POA: Diagnosis not present

## 2015-01-10 DIAGNOSIS — I872 Venous insufficiency (chronic) (peripheral): Secondary | ICD-10-CM | POA: Diagnosis not present

## 2015-01-10 DIAGNOSIS — R600 Localized edema: Secondary | ICD-10-CM | POA: Diagnosis not present

## 2015-01-10 DIAGNOSIS — I6523 Occlusion and stenosis of bilateral carotid arteries: Secondary | ICD-10-CM | POA: Diagnosis not present

## 2015-01-11 DIAGNOSIS — M810 Age-related osteoporosis without current pathological fracture: Secondary | ICD-10-CM | POA: Diagnosis not present

## 2015-01-11 DIAGNOSIS — M4806 Spinal stenosis, lumbar region: Secondary | ICD-10-CM | POA: Diagnosis not present

## 2015-01-11 DIAGNOSIS — M159 Polyosteoarthritis, unspecified: Secondary | ICD-10-CM | POA: Diagnosis not present

## 2015-01-11 DIAGNOSIS — M6281 Muscle weakness (generalized): Secondary | ICD-10-CM | POA: Diagnosis not present

## 2015-01-11 DIAGNOSIS — Z8673 Personal history of transient ischemic attack (TIA), and cerebral infarction without residual deficits: Secondary | ICD-10-CM | POA: Diagnosis not present

## 2015-01-11 DIAGNOSIS — I10 Essential (primary) hypertension: Secondary | ICD-10-CM | POA: Diagnosis not present

## 2015-01-12 DIAGNOSIS — M6281 Muscle weakness (generalized): Secondary | ICD-10-CM | POA: Diagnosis not present

## 2015-01-12 DIAGNOSIS — Z8673 Personal history of transient ischemic attack (TIA), and cerebral infarction without residual deficits: Secondary | ICD-10-CM | POA: Diagnosis not present

## 2015-01-12 DIAGNOSIS — M159 Polyosteoarthritis, unspecified: Secondary | ICD-10-CM | POA: Diagnosis not present

## 2015-01-12 DIAGNOSIS — M4806 Spinal stenosis, lumbar region: Secondary | ICD-10-CM | POA: Diagnosis not present

## 2015-01-12 DIAGNOSIS — I10 Essential (primary) hypertension: Secondary | ICD-10-CM | POA: Diagnosis not present

## 2015-01-12 DIAGNOSIS — M810 Age-related osteoporosis without current pathological fracture: Secondary | ICD-10-CM | POA: Diagnosis not present

## 2015-01-15 DIAGNOSIS — Z8673 Personal history of transient ischemic attack (TIA), and cerebral infarction without residual deficits: Secondary | ICD-10-CM | POA: Diagnosis not present

## 2015-01-15 DIAGNOSIS — M810 Age-related osteoporosis without current pathological fracture: Secondary | ICD-10-CM | POA: Diagnosis not present

## 2015-01-15 DIAGNOSIS — M4806 Spinal stenosis, lumbar region: Secondary | ICD-10-CM | POA: Diagnosis not present

## 2015-01-15 DIAGNOSIS — M6281 Muscle weakness (generalized): Secondary | ICD-10-CM | POA: Diagnosis not present

## 2015-01-15 DIAGNOSIS — M159 Polyosteoarthritis, unspecified: Secondary | ICD-10-CM | POA: Diagnosis not present

## 2015-01-15 DIAGNOSIS — I10 Essential (primary) hypertension: Secondary | ICD-10-CM | POA: Diagnosis not present

## 2015-01-16 DIAGNOSIS — Z8673 Personal history of transient ischemic attack (TIA), and cerebral infarction without residual deficits: Secondary | ICD-10-CM | POA: Diagnosis not present

## 2015-01-16 DIAGNOSIS — M4806 Spinal stenosis, lumbar region: Secondary | ICD-10-CM | POA: Diagnosis not present

## 2015-01-16 DIAGNOSIS — M159 Polyosteoarthritis, unspecified: Secondary | ICD-10-CM | POA: Diagnosis not present

## 2015-01-16 DIAGNOSIS — M810 Age-related osteoporosis without current pathological fracture: Secondary | ICD-10-CM | POA: Diagnosis not present

## 2015-01-16 DIAGNOSIS — M6281 Muscle weakness (generalized): Secondary | ICD-10-CM | POA: Diagnosis not present

## 2015-01-16 DIAGNOSIS — I10 Essential (primary) hypertension: Secondary | ICD-10-CM | POA: Diagnosis not present

## 2015-01-17 DIAGNOSIS — M6281 Muscle weakness (generalized): Secondary | ICD-10-CM | POA: Diagnosis not present

## 2015-01-17 DIAGNOSIS — I10 Essential (primary) hypertension: Secondary | ICD-10-CM | POA: Diagnosis not present

## 2015-01-17 DIAGNOSIS — M4806 Spinal stenosis, lumbar region: Secondary | ICD-10-CM | POA: Diagnosis not present

## 2015-01-17 DIAGNOSIS — M159 Polyosteoarthritis, unspecified: Secondary | ICD-10-CM | POA: Diagnosis not present

## 2015-01-17 DIAGNOSIS — Z8673 Personal history of transient ischemic attack (TIA), and cerebral infarction without residual deficits: Secondary | ICD-10-CM | POA: Diagnosis not present

## 2015-01-17 DIAGNOSIS — M810 Age-related osteoporosis without current pathological fracture: Secondary | ICD-10-CM | POA: Diagnosis not present

## 2015-01-18 DIAGNOSIS — M4806 Spinal stenosis, lumbar region: Secondary | ICD-10-CM | POA: Diagnosis not present

## 2015-01-18 DIAGNOSIS — Z8673 Personal history of transient ischemic attack (TIA), and cerebral infarction without residual deficits: Secondary | ICD-10-CM | POA: Diagnosis not present

## 2015-01-18 DIAGNOSIS — I10 Essential (primary) hypertension: Secondary | ICD-10-CM | POA: Diagnosis not present

## 2015-01-18 DIAGNOSIS — M6281 Muscle weakness (generalized): Secondary | ICD-10-CM | POA: Diagnosis not present

## 2015-01-18 DIAGNOSIS — M159 Polyosteoarthritis, unspecified: Secondary | ICD-10-CM | POA: Diagnosis not present

## 2015-01-18 DIAGNOSIS — M810 Age-related osteoporosis without current pathological fracture: Secondary | ICD-10-CM | POA: Diagnosis not present

## 2015-01-19 DIAGNOSIS — M6281 Muscle weakness (generalized): Secondary | ICD-10-CM | POA: Diagnosis not present

## 2015-01-19 DIAGNOSIS — Z8673 Personal history of transient ischemic attack (TIA), and cerebral infarction without residual deficits: Secondary | ICD-10-CM | POA: Diagnosis not present

## 2015-01-19 DIAGNOSIS — M4806 Spinal stenosis, lumbar region: Secondary | ICD-10-CM | POA: Diagnosis not present

## 2015-01-19 DIAGNOSIS — M159 Polyosteoarthritis, unspecified: Secondary | ICD-10-CM | POA: Diagnosis not present

## 2015-01-19 DIAGNOSIS — I10 Essential (primary) hypertension: Secondary | ICD-10-CM | POA: Diagnosis not present

## 2015-01-19 DIAGNOSIS — M810 Age-related osteoporosis without current pathological fracture: Secondary | ICD-10-CM | POA: Diagnosis not present

## 2015-01-22 DIAGNOSIS — M4806 Spinal stenosis, lumbar region: Secondary | ICD-10-CM | POA: Diagnosis not present

## 2015-01-22 DIAGNOSIS — M810 Age-related osteoporosis without current pathological fracture: Secondary | ICD-10-CM | POA: Diagnosis not present

## 2015-01-22 DIAGNOSIS — Z8673 Personal history of transient ischemic attack (TIA), and cerebral infarction without residual deficits: Secondary | ICD-10-CM | POA: Diagnosis not present

## 2015-01-22 DIAGNOSIS — M159 Polyosteoarthritis, unspecified: Secondary | ICD-10-CM | POA: Diagnosis not present

## 2015-01-22 DIAGNOSIS — I10 Essential (primary) hypertension: Secondary | ICD-10-CM | POA: Diagnosis not present

## 2015-01-22 DIAGNOSIS — M6281 Muscle weakness (generalized): Secondary | ICD-10-CM | POA: Diagnosis not present

## 2015-01-23 DIAGNOSIS — M4806 Spinal stenosis, lumbar region: Secondary | ICD-10-CM | POA: Diagnosis not present

## 2015-01-23 DIAGNOSIS — M159 Polyosteoarthritis, unspecified: Secondary | ICD-10-CM | POA: Diagnosis not present

## 2015-01-23 DIAGNOSIS — M810 Age-related osteoporosis without current pathological fracture: Secondary | ICD-10-CM | POA: Diagnosis not present

## 2015-01-23 DIAGNOSIS — I10 Essential (primary) hypertension: Secondary | ICD-10-CM | POA: Diagnosis not present

## 2015-01-23 DIAGNOSIS — Z8673 Personal history of transient ischemic attack (TIA), and cerebral infarction without residual deficits: Secondary | ICD-10-CM | POA: Diagnosis not present

## 2015-01-23 DIAGNOSIS — M6281 Muscle weakness (generalized): Secondary | ICD-10-CM | POA: Diagnosis not present

## 2015-01-24 DIAGNOSIS — Z8673 Personal history of transient ischemic attack (TIA), and cerebral infarction without residual deficits: Secondary | ICD-10-CM | POA: Diagnosis not present

## 2015-01-24 DIAGNOSIS — I10 Essential (primary) hypertension: Secondary | ICD-10-CM | POA: Diagnosis not present

## 2015-01-24 DIAGNOSIS — M159 Polyosteoarthritis, unspecified: Secondary | ICD-10-CM | POA: Diagnosis not present

## 2015-01-24 DIAGNOSIS — I119 Hypertensive heart disease without heart failure: Secondary | ICD-10-CM | POA: Insufficient documentation

## 2015-01-24 DIAGNOSIS — M810 Age-related osteoporosis without current pathological fracture: Secondary | ICD-10-CM | POA: Diagnosis not present

## 2015-01-24 DIAGNOSIS — M79604 Pain in right leg: Secondary | ICD-10-CM | POA: Diagnosis not present

## 2015-01-24 DIAGNOSIS — R600 Localized edema: Secondary | ICD-10-CM | POA: Diagnosis not present

## 2015-01-24 DIAGNOSIS — M6281 Muscle weakness (generalized): Secondary | ICD-10-CM | POA: Diagnosis not present

## 2015-01-24 DIAGNOSIS — M4806 Spinal stenosis, lumbar region: Secondary | ICD-10-CM | POA: Diagnosis not present

## 2015-01-25 DIAGNOSIS — M6281 Muscle weakness (generalized): Secondary | ICD-10-CM | POA: Diagnosis not present

## 2015-01-25 DIAGNOSIS — I10 Essential (primary) hypertension: Secondary | ICD-10-CM | POA: Diagnosis not present

## 2015-01-25 DIAGNOSIS — Z8673 Personal history of transient ischemic attack (TIA), and cerebral infarction without residual deficits: Secondary | ICD-10-CM | POA: Diagnosis not present

## 2015-01-25 DIAGNOSIS — M4806 Spinal stenosis, lumbar region: Secondary | ICD-10-CM | POA: Diagnosis not present

## 2015-01-25 DIAGNOSIS — M159 Polyosteoarthritis, unspecified: Secondary | ICD-10-CM | POA: Diagnosis not present

## 2015-01-25 DIAGNOSIS — M810 Age-related osteoporosis without current pathological fracture: Secondary | ICD-10-CM | POA: Diagnosis not present

## 2015-01-26 DIAGNOSIS — M810 Age-related osteoporosis without current pathological fracture: Secondary | ICD-10-CM | POA: Diagnosis not present

## 2015-01-26 DIAGNOSIS — Z8673 Personal history of transient ischemic attack (TIA), and cerebral infarction without residual deficits: Secondary | ICD-10-CM | POA: Diagnosis not present

## 2015-01-26 DIAGNOSIS — I10 Essential (primary) hypertension: Secondary | ICD-10-CM | POA: Diagnosis not present

## 2015-01-26 DIAGNOSIS — M4806 Spinal stenosis, lumbar region: Secondary | ICD-10-CM | POA: Diagnosis not present

## 2015-01-26 DIAGNOSIS — M159 Polyosteoarthritis, unspecified: Secondary | ICD-10-CM | POA: Diagnosis not present

## 2015-01-26 DIAGNOSIS — M6281 Muscle weakness (generalized): Secondary | ICD-10-CM | POA: Diagnosis not present

## 2015-01-30 DIAGNOSIS — I10 Essential (primary) hypertension: Secondary | ICD-10-CM | POA: Diagnosis not present

## 2015-01-30 DIAGNOSIS — M4806 Spinal stenosis, lumbar region: Secondary | ICD-10-CM | POA: Diagnosis not present

## 2015-01-30 DIAGNOSIS — M810 Age-related osteoporosis without current pathological fracture: Secondary | ICD-10-CM | POA: Diagnosis not present

## 2015-01-30 DIAGNOSIS — M6281 Muscle weakness (generalized): Secondary | ICD-10-CM | POA: Diagnosis not present

## 2015-01-30 DIAGNOSIS — Z8673 Personal history of transient ischemic attack (TIA), and cerebral infarction without residual deficits: Secondary | ICD-10-CM | POA: Diagnosis not present

## 2015-01-30 DIAGNOSIS — M159 Polyosteoarthritis, unspecified: Secondary | ICD-10-CM | POA: Diagnosis not present

## 2015-02-01 DIAGNOSIS — Z8673 Personal history of transient ischemic attack (TIA), and cerebral infarction without residual deficits: Secondary | ICD-10-CM | POA: Diagnosis not present

## 2015-02-01 DIAGNOSIS — M810 Age-related osteoporosis without current pathological fracture: Secondary | ICD-10-CM | POA: Diagnosis not present

## 2015-02-01 DIAGNOSIS — M6281 Muscle weakness (generalized): Secondary | ICD-10-CM | POA: Diagnosis not present

## 2015-02-01 DIAGNOSIS — I10 Essential (primary) hypertension: Secondary | ICD-10-CM | POA: Diagnosis not present

## 2015-02-01 DIAGNOSIS — M159 Polyosteoarthritis, unspecified: Secondary | ICD-10-CM | POA: Diagnosis not present

## 2015-02-01 DIAGNOSIS — M4806 Spinal stenosis, lumbar region: Secondary | ICD-10-CM | POA: Diagnosis not present

## 2015-02-01 NOTE — Telephone Encounter (Signed)
done

## 2015-02-02 DIAGNOSIS — M6281 Muscle weakness (generalized): Secondary | ICD-10-CM | POA: Diagnosis not present

## 2015-02-02 DIAGNOSIS — M4806 Spinal stenosis, lumbar region: Secondary | ICD-10-CM | POA: Diagnosis not present

## 2015-02-02 DIAGNOSIS — M810 Age-related osteoporosis without current pathological fracture: Secondary | ICD-10-CM | POA: Diagnosis not present

## 2015-02-02 DIAGNOSIS — I10 Essential (primary) hypertension: Secondary | ICD-10-CM | POA: Diagnosis not present

## 2015-02-02 DIAGNOSIS — Z8673 Personal history of transient ischemic attack (TIA), and cerebral infarction without residual deficits: Secondary | ICD-10-CM | POA: Diagnosis not present

## 2015-02-02 DIAGNOSIS — M159 Polyosteoarthritis, unspecified: Secondary | ICD-10-CM | POA: Diagnosis not present

## 2015-02-06 DIAGNOSIS — M810 Age-related osteoporosis without current pathological fracture: Secondary | ICD-10-CM | POA: Diagnosis not present

## 2015-02-06 DIAGNOSIS — Z8673 Personal history of transient ischemic attack (TIA), and cerebral infarction without residual deficits: Secondary | ICD-10-CM | POA: Diagnosis not present

## 2015-02-06 DIAGNOSIS — I10 Essential (primary) hypertension: Secondary | ICD-10-CM | POA: Diagnosis not present

## 2015-02-06 DIAGNOSIS — M6281 Muscle weakness (generalized): Secondary | ICD-10-CM | POA: Diagnosis not present

## 2015-02-06 DIAGNOSIS — M159 Polyosteoarthritis, unspecified: Secondary | ICD-10-CM | POA: Diagnosis not present

## 2015-02-06 DIAGNOSIS — M4806 Spinal stenosis, lumbar region: Secondary | ICD-10-CM | POA: Diagnosis not present

## 2015-02-09 DIAGNOSIS — I10 Essential (primary) hypertension: Secondary | ICD-10-CM | POA: Diagnosis not present

## 2015-02-09 DIAGNOSIS — Z8673 Personal history of transient ischemic attack (TIA), and cerebral infarction without residual deficits: Secondary | ICD-10-CM | POA: Diagnosis not present

## 2015-02-09 DIAGNOSIS — M4806 Spinal stenosis, lumbar region: Secondary | ICD-10-CM | POA: Diagnosis not present

## 2015-02-09 DIAGNOSIS — R609 Edema, unspecified: Secondary | ICD-10-CM | POA: Diagnosis not present

## 2015-02-09 DIAGNOSIS — M159 Polyosteoarthritis, unspecified: Secondary | ICD-10-CM | POA: Diagnosis not present

## 2015-02-09 DIAGNOSIS — M810 Age-related osteoporosis without current pathological fracture: Secondary | ICD-10-CM | POA: Diagnosis not present

## 2015-02-12 ENCOUNTER — Other Ambulatory Visit: Payer: Self-pay | Admitting: Family Medicine

## 2015-02-13 DIAGNOSIS — R609 Edema, unspecified: Secondary | ICD-10-CM | POA: Diagnosis not present

## 2015-02-13 DIAGNOSIS — M4806 Spinal stenosis, lumbar region: Secondary | ICD-10-CM | POA: Diagnosis not present

## 2015-02-13 DIAGNOSIS — M159 Polyosteoarthritis, unspecified: Secondary | ICD-10-CM | POA: Diagnosis not present

## 2015-02-13 DIAGNOSIS — Z8673 Personal history of transient ischemic attack (TIA), and cerebral infarction without residual deficits: Secondary | ICD-10-CM | POA: Diagnosis not present

## 2015-02-13 DIAGNOSIS — I10 Essential (primary) hypertension: Secondary | ICD-10-CM | POA: Diagnosis not present

## 2015-02-13 DIAGNOSIS — M810 Age-related osteoporosis without current pathological fracture: Secondary | ICD-10-CM | POA: Diagnosis not present

## 2015-02-16 DIAGNOSIS — R609 Edema, unspecified: Secondary | ICD-10-CM | POA: Diagnosis not present

## 2015-02-16 DIAGNOSIS — M159 Polyosteoarthritis, unspecified: Secondary | ICD-10-CM | POA: Diagnosis not present

## 2015-02-16 DIAGNOSIS — M4806 Spinal stenosis, lumbar region: Secondary | ICD-10-CM | POA: Diagnosis not present

## 2015-02-16 DIAGNOSIS — Z8673 Personal history of transient ischemic attack (TIA), and cerebral infarction without residual deficits: Secondary | ICD-10-CM | POA: Diagnosis not present

## 2015-02-16 DIAGNOSIS — M810 Age-related osteoporosis without current pathological fracture: Secondary | ICD-10-CM | POA: Diagnosis not present

## 2015-02-16 DIAGNOSIS — I10 Essential (primary) hypertension: Secondary | ICD-10-CM | POA: Diagnosis not present

## 2015-02-20 DIAGNOSIS — I10 Essential (primary) hypertension: Secondary | ICD-10-CM | POA: Diagnosis not present

## 2015-02-20 DIAGNOSIS — M4806 Spinal stenosis, lumbar region: Secondary | ICD-10-CM | POA: Diagnosis not present

## 2015-02-20 DIAGNOSIS — R609 Edema, unspecified: Secondary | ICD-10-CM | POA: Diagnosis not present

## 2015-02-20 DIAGNOSIS — M159 Polyosteoarthritis, unspecified: Secondary | ICD-10-CM | POA: Diagnosis not present

## 2015-02-20 DIAGNOSIS — M810 Age-related osteoporosis without current pathological fracture: Secondary | ICD-10-CM | POA: Diagnosis not present

## 2015-02-20 DIAGNOSIS — Z8673 Personal history of transient ischemic attack (TIA), and cerebral infarction without residual deficits: Secondary | ICD-10-CM | POA: Diagnosis not present

## 2015-02-22 DIAGNOSIS — M159 Polyosteoarthritis, unspecified: Secondary | ICD-10-CM | POA: Diagnosis not present

## 2015-02-22 DIAGNOSIS — M4806 Spinal stenosis, lumbar region: Secondary | ICD-10-CM | POA: Diagnosis not present

## 2015-02-22 DIAGNOSIS — I10 Essential (primary) hypertension: Secondary | ICD-10-CM | POA: Diagnosis not present

## 2015-02-22 DIAGNOSIS — M810 Age-related osteoporosis without current pathological fracture: Secondary | ICD-10-CM | POA: Diagnosis not present

## 2015-02-22 DIAGNOSIS — Z8673 Personal history of transient ischemic attack (TIA), and cerebral infarction without residual deficits: Secondary | ICD-10-CM | POA: Diagnosis not present

## 2015-02-22 DIAGNOSIS — R609 Edema, unspecified: Secondary | ICD-10-CM | POA: Diagnosis not present

## 2015-02-27 DIAGNOSIS — I10 Essential (primary) hypertension: Secondary | ICD-10-CM | POA: Diagnosis not present

## 2015-02-27 DIAGNOSIS — R609 Edema, unspecified: Secondary | ICD-10-CM | POA: Diagnosis not present

## 2015-02-27 DIAGNOSIS — M159 Polyosteoarthritis, unspecified: Secondary | ICD-10-CM | POA: Diagnosis not present

## 2015-02-27 DIAGNOSIS — Z8673 Personal history of transient ischemic attack (TIA), and cerebral infarction without residual deficits: Secondary | ICD-10-CM | POA: Diagnosis not present

## 2015-02-27 DIAGNOSIS — M4806 Spinal stenosis, lumbar region: Secondary | ICD-10-CM | POA: Diagnosis not present

## 2015-02-27 DIAGNOSIS — M810 Age-related osteoporosis without current pathological fracture: Secondary | ICD-10-CM | POA: Diagnosis not present

## 2015-03-01 DIAGNOSIS — Z8673 Personal history of transient ischemic attack (TIA), and cerebral infarction without residual deficits: Secondary | ICD-10-CM | POA: Diagnosis not present

## 2015-03-01 DIAGNOSIS — I10 Essential (primary) hypertension: Secondary | ICD-10-CM | POA: Diagnosis not present

## 2015-03-01 DIAGNOSIS — M159 Polyosteoarthritis, unspecified: Secondary | ICD-10-CM | POA: Diagnosis not present

## 2015-03-01 DIAGNOSIS — M810 Age-related osteoporosis without current pathological fracture: Secondary | ICD-10-CM | POA: Diagnosis not present

## 2015-03-01 DIAGNOSIS — M4806 Spinal stenosis, lumbar region: Secondary | ICD-10-CM | POA: Diagnosis not present

## 2015-03-01 DIAGNOSIS — R609 Edema, unspecified: Secondary | ICD-10-CM | POA: Diagnosis not present

## 2015-03-07 DIAGNOSIS — Z8673 Personal history of transient ischemic attack (TIA), and cerebral infarction without residual deficits: Secondary | ICD-10-CM | POA: Diagnosis not present

## 2015-03-07 DIAGNOSIS — R609 Edema, unspecified: Secondary | ICD-10-CM | POA: Diagnosis not present

## 2015-03-07 DIAGNOSIS — I10 Essential (primary) hypertension: Secondary | ICD-10-CM | POA: Diagnosis not present

## 2015-03-07 DIAGNOSIS — M810 Age-related osteoporosis without current pathological fracture: Secondary | ICD-10-CM | POA: Diagnosis not present

## 2015-03-07 DIAGNOSIS — M4806 Spinal stenosis, lumbar region: Secondary | ICD-10-CM | POA: Diagnosis not present

## 2015-03-07 DIAGNOSIS — M159 Polyosteoarthritis, unspecified: Secondary | ICD-10-CM | POA: Diagnosis not present

## 2015-03-10 DIAGNOSIS — M159 Polyosteoarthritis, unspecified: Secondary | ICD-10-CM | POA: Diagnosis not present

## 2015-03-10 DIAGNOSIS — I10 Essential (primary) hypertension: Secondary | ICD-10-CM | POA: Diagnosis not present

## 2015-03-10 DIAGNOSIS — R609 Edema, unspecified: Secondary | ICD-10-CM | POA: Diagnosis not present

## 2015-03-10 DIAGNOSIS — M4806 Spinal stenosis, lumbar region: Secondary | ICD-10-CM | POA: Diagnosis not present

## 2015-03-10 DIAGNOSIS — M810 Age-related osteoporosis without current pathological fracture: Secondary | ICD-10-CM | POA: Diagnosis not present

## 2015-03-10 DIAGNOSIS — Z8673 Personal history of transient ischemic attack (TIA), and cerebral infarction without residual deficits: Secondary | ICD-10-CM | POA: Diagnosis not present

## 2015-03-13 DIAGNOSIS — I10 Essential (primary) hypertension: Secondary | ICD-10-CM | POA: Diagnosis not present

## 2015-03-13 DIAGNOSIS — M4806 Spinal stenosis, lumbar region: Secondary | ICD-10-CM | POA: Diagnosis not present

## 2015-03-13 DIAGNOSIS — M810 Age-related osteoporosis without current pathological fracture: Secondary | ICD-10-CM | POA: Diagnosis not present

## 2015-03-13 DIAGNOSIS — R609 Edema, unspecified: Secondary | ICD-10-CM | POA: Diagnosis not present

## 2015-03-13 DIAGNOSIS — M159 Polyosteoarthritis, unspecified: Secondary | ICD-10-CM | POA: Diagnosis not present

## 2015-03-13 DIAGNOSIS — Z8673 Personal history of transient ischemic attack (TIA), and cerebral infarction without residual deficits: Secondary | ICD-10-CM | POA: Diagnosis not present

## 2015-03-16 ENCOUNTER — Telehealth: Payer: Self-pay

## 2015-03-16 NOTE — Telephone Encounter (Signed)
Pts daughter would like to have home health come in and have them continue to do the leg wraps as they were before until they come in for her appt scheduled on November 7th or until the order the velcro wraps are ordered and available.

## 2015-03-16 NOTE — Telephone Encounter (Signed)
Dr. Nehemiah Massed mentioned that patient may benefit from Velcro compression wraps  There will have to be an order made for these but patient has Medicare and will need face to face visit for coverage

## 2015-03-19 DIAGNOSIS — M7989 Other specified soft tissue disorders: Secondary | ICD-10-CM | POA: Diagnosis not present

## 2015-03-19 DIAGNOSIS — M79609 Pain in unspecified limb: Secondary | ICD-10-CM | POA: Diagnosis not present

## 2015-03-19 DIAGNOSIS — I872 Venous insufficiency (chronic) (peripheral): Secondary | ICD-10-CM | POA: Diagnosis not present

## 2015-03-19 DIAGNOSIS — I890 Lymphedema, not elsewhere classified: Secondary | ICD-10-CM | POA: Diagnosis not present

## 2015-03-19 NOTE — Telephone Encounter (Signed)
Call for an appointment please    Wynn Maudlin, CMA at 03/16/2015 10:53 AM     Status: Signed       Expand All Collapse All   Dr. Nehemiah Massed mentioned that patient may benefit from Velcro compression wraps  There will have to be an order made for these but patient has Medicare and will need face to face visit for coverage

## 2015-03-20 NOTE — Telephone Encounter (Signed)
Appt scheduled for 04/02/15

## 2015-03-21 DIAGNOSIS — I10 Essential (primary) hypertension: Secondary | ICD-10-CM | POA: Diagnosis not present

## 2015-03-21 DIAGNOSIS — M159 Polyosteoarthritis, unspecified: Secondary | ICD-10-CM | POA: Diagnosis not present

## 2015-03-21 DIAGNOSIS — M4806 Spinal stenosis, lumbar region: Secondary | ICD-10-CM | POA: Diagnosis not present

## 2015-03-21 DIAGNOSIS — R609 Edema, unspecified: Secondary | ICD-10-CM | POA: Diagnosis not present

## 2015-03-21 DIAGNOSIS — M810 Age-related osteoporosis without current pathological fracture: Secondary | ICD-10-CM | POA: Diagnosis not present

## 2015-03-21 DIAGNOSIS — Z8673 Personal history of transient ischemic attack (TIA), and cerebral infarction without residual deficits: Secondary | ICD-10-CM | POA: Diagnosis not present

## 2015-03-23 DIAGNOSIS — M159 Polyosteoarthritis, unspecified: Secondary | ICD-10-CM | POA: Diagnosis not present

## 2015-03-23 DIAGNOSIS — Z8673 Personal history of transient ischemic attack (TIA), and cerebral infarction without residual deficits: Secondary | ICD-10-CM | POA: Diagnosis not present

## 2015-03-23 DIAGNOSIS — M4806 Spinal stenosis, lumbar region: Secondary | ICD-10-CM | POA: Diagnosis not present

## 2015-03-23 DIAGNOSIS — M810 Age-related osteoporosis without current pathological fracture: Secondary | ICD-10-CM | POA: Diagnosis not present

## 2015-03-23 DIAGNOSIS — I10 Essential (primary) hypertension: Secondary | ICD-10-CM | POA: Diagnosis not present

## 2015-03-23 DIAGNOSIS — R609 Edema, unspecified: Secondary | ICD-10-CM | POA: Diagnosis not present

## 2015-03-28 DIAGNOSIS — M810 Age-related osteoporosis without current pathological fracture: Secondary | ICD-10-CM | POA: Diagnosis not present

## 2015-03-28 DIAGNOSIS — I10 Essential (primary) hypertension: Secondary | ICD-10-CM | POA: Diagnosis not present

## 2015-03-28 DIAGNOSIS — M4806 Spinal stenosis, lumbar region: Secondary | ICD-10-CM | POA: Diagnosis not present

## 2015-03-28 DIAGNOSIS — M159 Polyosteoarthritis, unspecified: Secondary | ICD-10-CM | POA: Diagnosis not present

## 2015-03-28 DIAGNOSIS — Z8673 Personal history of transient ischemic attack (TIA), and cerebral infarction without residual deficits: Secondary | ICD-10-CM | POA: Diagnosis not present

## 2015-03-28 DIAGNOSIS — R609 Edema, unspecified: Secondary | ICD-10-CM | POA: Diagnosis not present

## 2015-04-02 ENCOUNTER — Encounter: Payer: Self-pay | Admitting: Family Medicine

## 2015-04-02 ENCOUNTER — Ambulatory Visit (INDEPENDENT_AMBULATORY_CARE_PROVIDER_SITE_OTHER): Payer: Medicare Other | Admitting: Family Medicine

## 2015-04-02 VITALS — BP 107/74 | HR 77 | Temp 98.3°F | Ht 65.5 in | Wt 230.0 lb

## 2015-04-02 DIAGNOSIS — Z23 Encounter for immunization: Secondary | ICD-10-CM | POA: Diagnosis not present

## 2015-04-02 DIAGNOSIS — R600 Localized edema: Secondary | ICD-10-CM

## 2015-04-02 DIAGNOSIS — M4806 Spinal stenosis, lumbar region: Secondary | ICD-10-CM

## 2015-04-02 DIAGNOSIS — M48061 Spinal stenosis, lumbar region without neurogenic claudication: Secondary | ICD-10-CM

## 2015-04-02 DIAGNOSIS — I10 Essential (primary) hypertension: Secondary | ICD-10-CM | POA: Diagnosis not present

## 2015-04-02 MED ORDER — CLOPIDOGREL BISULFATE 75 MG PO TABS: ORAL_TABLET | ORAL | Status: DC

## 2015-04-02 MED ORDER — RAMIPRIL 2.5 MG PO CAPS: 2.5000 mg | ORAL_CAPSULE | Freq: Every day | ORAL | Status: DC

## 2015-04-02 NOTE — Assessment & Plan Note (Signed)
Reviewed supportive care and compression elevation

## 2015-04-02 NOTE — Progress Notes (Signed)
BP 107/74 mmHg  Pulse 77  Temp(Src) 98.3 F (36.8 C)  Ht 5' 5.5" (1.664 m)  Wt 230 lb (104.327 kg)  BMI 37.68 kg/m2  SpO2 99%   Subjective:    Patient ID: Jennifer Murray, female    DOB: 1927-08-25, 81 y.o.   MRN: [id number]  HPI: Jennifer Murray is a 81 y.o. female  Chief Complaint  Patient presents with  . Hypertension   Patient accompanied by her daughter-in-law who provides a lot of history. Blood pressures been doing well no complaints from medication taking medicines faithfully with no dizziness lightheaded or side effects Patient does have dependent edema has been working with vascular and is gotten compression and compression machine which helps Patient is been using this machine some has some difficulty especially with elevation. Has problems with walking partly because her legs are so heavy and swollen but also from spinal stenosis and degenerative arthritis of hips and knees.  Relevant past medical, surgical, family and social history reviewed and updated as indicated. Interim medical history since our last visit reviewed. Allergies and medications reviewed and updated.  Review of Systems  Constitutional: Positive for fatigue.  Respiratory: Negative for cough and shortness of breath.   Cardiovascular: Positive for leg swelling. Negative for chest pain and palpitations.    Per HPI unless specifically indicated above     Objective:    BP 107/74 mmHg  Pulse 77  Temp(Src) 98.3 F (36.8 C)  Ht 5' 5.5" (1.664 m)  Wt 230 lb (104.327 kg)  BMI 37.68 kg/m2  SpO2 99%  Wt Readings from Last 3 Encounters:  04/02/15 230 lb (104.327 kg)  09/28/14 233 lb (105.688 kg)    Physical Exam  Constitutional: She is oriented to person, place, and time. She appears well-developed and well-nourished. No distress.  HENT:  Head: Normocephalic and atraumatic.  Right Ear: Hearing normal.  Left Ear: Hearing normal.  Nose: Nose normal.  Eyes: Conjunctivae and lids are normal. Right  eye exhibits no discharge. Left eye exhibits no discharge. No scleral icterus.  Cardiovascular: Normal rate, regular rhythm and normal heart sounds.   Pulmonary/Chest: Effort normal and breath sounds normal. No respiratory distress.  Musculoskeletal: Normal range of motion.  Neurological: She is alert and oriented to person, place, and time.  Skin: Skin is intact. No rash noted.  Edema with support hose on  Psychiatric: She has a normal mood and affect. Her speech is normal and behavior is normal. Judgment and thought content normal. Cognition and memory are normal.    No results found for this or any previous visit.    Assessment & Plan:   Problem List Items Addressed This Visit      Cardiovascular and Mediastinum   Essential hypertension   Relevant Medications   furosemide (LASIX) 20 MG tablet   ramipril (ALTACE) 2.5 MG capsule     Other   Lumbar spinal stenosis    Discussed care treatment cautioned about walking and exercise      Leg edema    Reviewed supportive care and compression elevation       Other Visit Diagnoses    Essential hypertension, benign    -  Primary    Relevant Medications    furosemide (LASIX) 20 MG tablet    ramipril (ALTACE) 2.5 MG capsule    Other Relevant Orders    Basic metabolic panel    Immunization due        Relevant Orders    Flu  Vaccine QUAD 36+ mos PF IM (Fluarix & Fluzone Quad PF) (Completed)        Follow up plan: Return in about 6 months (around 09/30/2015), or if symptoms worsen or fail to improve, for Physical Exam.

## 2015-04-02 NOTE — Assessment & Plan Note (Signed)
Discussed care treatment cautioned about walking and exercise

## 2015-04-03 ENCOUNTER — Encounter: Payer: Self-pay | Admitting: Family Medicine

## 2015-04-03 LAB — BASIC METABOLIC PANEL
BUN/Creatinine Ratio: 32 — ABNORMAL HIGH (ref 11–26)
BUN: 17 mg/dL (ref 8–27)
CALCIUM: 10.5 mg/dL — AB (ref 8.7–10.3)
CHLORIDE: 100 mmol/L (ref 97–106)
CO2: 27 mmol/L (ref 18–29)
Creatinine, Ser: 0.53 mg/dL — ABNORMAL LOW (ref 0.57–1.00)
GFR calc Af Amer: 100 mL/min/{1.73_m2} (ref >59)
GFR calc non Af Amer: 87 mL/min/{1.73_m2} (ref >59)
GLUCOSE: 94 mg/dL (ref 65–99)
Potassium: 4.4 mmol/L (ref 3.5–5.2)
Sodium: 139 mmol/L (ref 136–144)

## 2015-04-04 DIAGNOSIS — M19011 Primary osteoarthritis, right shoulder: Secondary | ICD-10-CM | POA: Insufficient documentation

## 2015-04-04 DIAGNOSIS — M19012 Primary osteoarthritis, left shoulder: Secondary | ICD-10-CM | POA: Diagnosis not present

## 2015-04-06 ENCOUNTER — Telehealth: Payer: Self-pay

## 2015-04-06 NOTE — Telephone Encounter (Signed)
Patient wants to speak lab results.  She received letter from [facility]

## 2015-04-06 NOTE — Telephone Encounter (Signed)
Spoke with patient, she understands that all labs are ok and she needs to make no adjustments

## 2015-04-21 ENCOUNTER — Encounter: Payer: Self-pay | Admitting: Emergency Medicine

## 2015-04-21 ENCOUNTER — Emergency Department
Admission: EM | Admit: 2015-04-21 | Discharge: 2015-04-21 | Disposition: A | Discharge status: Home / Self Care | Payer: Medicare Other | Attending: Emergency Medicine | Admitting: Emergency Medicine

## 2015-04-21 ENCOUNTER — Emergency Department: Payer: Medicare Other

## 2015-04-21 DIAGNOSIS — M25572 Pain in left ankle and joints of left foot: Secondary | ICD-10-CM | POA: Diagnosis not present

## 2015-04-21 DIAGNOSIS — I10 Essential (primary) hypertension: Secondary | ICD-10-CM | POA: Diagnosis not present

## 2015-04-21 DIAGNOSIS — S0083XA Contusion of other part of head, initial encounter: Secondary | ICD-10-CM

## 2015-04-21 DIAGNOSIS — Y998 Other external cause status: Secondary | ICD-10-CM | POA: Diagnosis not present

## 2015-04-21 DIAGNOSIS — Y9389 Activity, other specified: Secondary | ICD-10-CM | POA: Insufficient documentation

## 2015-04-21 DIAGNOSIS — S93402A Sprain of unspecified ligament of left ankle, initial encounter: Secondary | ICD-10-CM | POA: Insufficient documentation

## 2015-04-21 DIAGNOSIS — S6991XA Unspecified injury of right wrist, hand and finger(s), initial encounter: Secondary | ICD-10-CM | POA: Diagnosis not present

## 2015-04-21 DIAGNOSIS — Z79899 Other long term (current) drug therapy: Secondary | ICD-10-CM | POA: Diagnosis not present

## 2015-04-21 DIAGNOSIS — Y9289 Other specified places as the place of occurrence of the external cause: Secondary | ICD-10-CM | POA: Diagnosis not present

## 2015-04-21 DIAGNOSIS — S99912A Unspecified injury of left ankle, initial encounter: Secondary | ICD-10-CM | POA: Diagnosis present

## 2015-04-21 DIAGNOSIS — S0990XA Unspecified injury of head, initial encounter: Secondary | ICD-10-CM | POA: Insufficient documentation

## 2015-04-21 DIAGNOSIS — W01198A Fall on same level from slipping, tripping and stumbling with subsequent striking against other object, initial encounter: Secondary | ICD-10-CM | POA: Insufficient documentation

## 2015-04-21 DIAGNOSIS — S51811A Laceration without foreign body of right forearm, initial encounter: Secondary | ICD-10-CM

## 2015-04-21 DIAGNOSIS — M79631 Pain in right forearm: Secondary | ICD-10-CM | POA: Diagnosis not present

## 2015-04-21 LAB — CBC
HCT: 43.1 % (ref 35.0–47.0)
HEMOGLOBIN: 14.4 g/dL (ref 12.0–16.0)
MCH: 30.1 pg (ref 26.0–34.0)
MCHC: 33.3 g/dL (ref 32.0–36.0)
MCV: 90.5 fL (ref 80.0–100.0)
PLATELETS: 165 10*3/uL (ref 150–440)
RBC: 4.77 MIL/uL (ref 3.80–5.20)
RDW: 13.5 % (ref 11.5–14.5)
WBC: 9 10*3/uL (ref 3.6–11.0)

## 2015-04-21 LAB — URINALYSIS COMPLETE WITH MICROSCOPIC (ARMC ONLY)
BILIRUBIN URINE: NEGATIVE
Glucose, UA: NEGATIVE mg/dL
Hgb urine dipstick: NEGATIVE
KETONES UR: NEGATIVE mg/dL
NITRITE: NEGATIVE
PH: 5 (ref 5.0–8.0)
Protein, ur: NEGATIVE mg/dL
SPECIFIC GRAVITY, URINE: 1.017 (ref 1.005–1.030)

## 2015-04-21 LAB — BASIC METABOLIC PANEL
ANION GAP: 5 (ref 5–15)
BUN: 20 mg/dL (ref 6–20)
CALCIUM: 9.8 mg/dL (ref 8.9–10.3)
CO2: 27 mmol/L (ref 22–32)
CREATININE: 0.72 mg/dL (ref 0.44–1.00)
Chloride: 108 mmol/L (ref 101–111)
Glucose, Bld: 123 mg/dL — ABNORMAL HIGH (ref 65–99)
Potassium: 4 mmol/L (ref 3.5–5.1)
SODIUM: 140 mmol/L (ref 135–145)

## 2015-04-21 MED ORDER — BACITRACIN ZINC 500 UNIT/GM EX OINT
TOPICAL_OINTMENT | Freq: Once | CUTANEOUS | Status: AC
  Administered 2015-04-21: 1 via TOPICAL
  Filled 2015-04-21: qty 0.9

## 2015-04-21 NOTE — ED Notes (Signed)
Patient states that she was bending over and fell. Unsure of the mechanism of the fall. Patient hit her head and has a contusion with abrasion to forehead and nose. Denies LOC. Patient states that she is on plavix. Patient with 2 skin tears to right forearm, bleeding controlled. Patient with complaint of left ankle pain.

## 2015-04-21 NOTE — ED Provider Notes (Signed)
[facility] Emergency Department Provider Note  ____________________________________________  Time seen: 8:45 PM  I have reviewed the triage vital signs and the nursing notes.   HISTORY  Chief Complaint Fall; Head Injury; and Ankle Pain    HPI Jennifer Murray is a 81 y.o. female who is leaning over to retrieve something from under her bed when she lost her footing and fell forward. She hit her forehead on the bed and landed on the floor. She is term medical alert to summon her family who then brought her to the emergency department. She has pain in the left ankle and right forearm. She also has swelling on her right forehead. No numbness tingling weakness headache vision change. No chest pain shortness of breath no preceding symptoms.  Patient is up-to-date on tetanus shot in all vaccinations. Follows up with primary care twice a year   Past Medical History  Diagnosis Date  . Acute cerebrovascular insufficiency transient focal neurologic deficit   . Osteoporosis   . Arthritis   . Lumbar spinal stenosis   . Obesity   . Leg edema   . Stroke ([place])   . Cancer (H[facility])     basil cell  . Diverticulosis      Patient Active Problem List   Diagnosis Date Noted  . Essential hypertension 04/02/2015  . Lumbar spinal stenosis 04/02/2015  . Leg edema 04/02/2015     Past Surgical History  Procedure Laterality Date  . Joint replacement Left     knee  . Breast biopsy    . Eye surgery Left     cataract  . Colonoscopy w/ polypectomy       Current Outpatient Rx  Name  Route  Sig  Dispense  Refill  . clopidogrel (PLAVIX) 75 MG tablet      TAKE 1 BY MOUTH DAILY   90 tablet   2   . furosemide (LASIX) 20 MG tablet   Oral   Take 20 mg by mouth daily as needed.      10   . Multiple Vitamins-Minerals (CENTRUM SILVER ADULT 50+ PO)   Oral   Take by mouth daily.         . ramipril (ALTACE) 2.5 MG capsule   Oral   Take 1 capsule (2.5 mg total) by mouth  daily.   90 capsule   2      Allergies Ciprocin-fluocin-procin   Family History  Problem Relation Age of Onset  . Cancer Mother     colon  . Heart attack Father   . Heart attack Brother   . Diabetes Paternal Grandmother   . Cancer Paternal Grandmother     breast  . Heart attack Paternal Grandfather     Social History Social History  Substance Use Topics  . Smoking status: Never Smoker   . Smokeless tobacco: Never Used  . Alcohol Use: No    Review of Systems  Constitutional:   No fever or chills. No weight changes Eyes:   No blurry vision or double vision.  ENT:   No sore throat. Cardiovascular:   No chest pain. Respiratory:   No dyspnea or cough. Gastrointestinal:   Negative for abdominal pain, vomiting and diarrhea.  No BRBPR or melena. Genitourinary:   Negative for dysuria, urinary retention, bloody urine, or difficulty urinating. Musculoskeletal:   Right forearm, left ankle, right forehead pain. Skin:   Negative for rash. Neurological:   Negative for headaches, focal weakness or numbness. Psychiatric:  No  anxiety or depression.   Endocrine:  No hot/cold intolerance, changes in energy, or sleep difficulty.  10-point ROS otherwise negative.  ____________________________________________   PHYSICAL EXAM:  VITAL SIGNS: ED Triage Vitals  Enc Vitals Group     BP 04/21/15 1950 134/88 mmHg     Pulse Rate 04/21/15 1950 84     Resp 04/21/15 1950 18     Temp 04/21/15 1950 98.4 F (36.9 C)     Temp Source 04/21/15 1950 Oral     SpO2 04/21/15 1950 100 %     Weight 04/21/15 1950 238 lb (107.956 kg)     Height 04/21/15 1950 5\' 5"  (1.651 m)     Head Cir --      Peak Flow --      Pain Score 04/21/15 2000 2     Pain Loc --      Pain Edu? --      Excl. in [place]? --      Constitutional:   Alert and oriented. Well appearing and in no distress. Eyes:   No scleral icterus. No conjunctival pallor. PERRL. EOMI ENT   Head:   Normocephalic with contusion and  hematoma on the right forehead. No bony tenderness.   Nose:   No congestion/rhinnorhea. No septal hematoma   Mouth/Throat:   MMM, no pharyngeal erythema. No peritonsillar mass. No uvula shift.   Neck:   No stridor. No SubQ emphysema. No meningismus. Hematological/Lymphatic/Immunilogical:   No cervical lymphadenopathy. Cardiovascular:   RRR. Normal and symmetric distal pulses are present in all extremities. No murmurs, rubs, or gallops. Respiratory:   Normal respiratory effort without tachypnea nor retractions. Breath sounds are clear and equal bilaterally. No wheezes/rales/rhonchi. Gastrointestinal:   Soft and nontender. No distention. There is no CVA tenderness.  No rebound, rigidity, or guarding. Genitourinary:   deferred Musculoskeletal:   Mild tenderness at distal right forearm, no bony deformity or step-off or crepitus. Mild tenderness in the distal left tibia proximal to the medial malleolus without instability or deformity. Full range of motion in the ankle. Full range of motion in the right wrist. Other external is unremarkable.  Neurologic:   Normal speech and language.  CN 2-10 normal. Motor grossly intact. No pronator drift.  Normal gait. No gross focal neurologic deficits are appreciated.  Skin:    Skin is warm, dry with 2 small skin tear is approximately 2 cm each overlying the right forearm. Hemostatic, no significant open wound.. No rash noted.  No petechiae, purpura, or bullae. Psychiatric:   Mood and affect are normal. Speech and behavior are normal. Patient exhibits appropriate insight and judgment.  ____________________________________________    LABS (pertinent positives/negatives) (all labs ordered are listed, but only abnormal results are displayed) Labs Reviewed  BASIC METABOLIC PANEL - Abnormal; Notable for the following:    Glucose, Bld 123 (*)    All other components within normal limits  URINALYSIS COMPLETEWITH MICROSCOPIC ([facility] ONLY) - Abnormal;  Notable for the following:    Color, Urine YELLOW (*)    APPearance HAZY (*)    Leukocytes, UA TRACE (*)    Bacteria, UA RARE (*)    Squamous Epithelial / LPF 0-5 (*)    All other components within normal limits  CBC   ____________________________________________   EKG  Interpreted by me Normal sinus rhythm rate of 81, left axis, normal intervals. Incomplete right bundle-branch block. Normal ST segments and T waves  ____________________________________________    RADIOLOGY  CT head unremarkable X-ray right forearm unremarkable  X-ray left ankle unremarkable  ____________________________________________   PROCEDURES   ____________________________________________   INITIAL IMPRESSION / ASSESSMENT AND PLAN / ED COURSE  Pertinent labs & imaging results that were available during my care of the patient were reviewed by me and considered in my medical decision making (see chart for details).  Patient presents with multiple muscular skeletal complaints after mechanical fall. She said she does have skin tears on the right forearm and a mild left ankle sprain. No fracture dislocation or other injuries. No intracranial injuries although she does have a scalp contusion on the right forehead. Counseled then to use Neosporin on the abrasion overlying the right forehead hematoma, dried gauze dressing for the skin tears on the right forearm, Ace wrap the left ankle. Follow up with primary care and cardiology.     ____________________________________________   FINAL CLINICAL IMPRESSION(S) / ED DIAGNOSES  Final diagnoses:  Left ankle sprain, initial encounter  Skin tear of right forearm without complication, initial encounter  Forehead contusion, initial encounter      Carrie Mew, MD 04/21/15 2112

## 2015-04-21 NOTE — Discharge Instructions (Signed)
Ankle Sprain °An ankle sprain is an injury to the strong, fibrous tissues (ligaments) that hold the bones of your ankle joint together.  °CAUSES °An ankle sprain is usually caused by a fall or by twisting your ankle. Ankle sprains most commonly occur when you step on the outer edge of your foot, and your ankle turns inward. People who participate in sports are more prone to these types of injuries.  °SYMPTOMS  °· Pain in your ankle. The pain may be present at rest or only when you are trying to stand or walk. °· Swelling. °· Bruising. Bruising may develop immediately or within 1 to 2 days after your injury. °· Difficulty standing or walking, particularly when turning corners or changing directions. °DIAGNOSIS  °Your caregiver will ask you details about your injury and perform a physical exam of your ankle to determine if you have an ankle sprain. During the physical exam, your caregiver will press on and apply pressure to specific areas of your foot and ankle. Your caregiver will try to move your ankle in certain ways. An X-ray exam may be done to be sure a bone was not broken or a ligament did not separate from one of the bones in your ankle (avulsion fracture).  °TREATMENT  °Certain types of braces can help stabilize your ankle. Your caregiver can make a recommendation for this. Your caregiver may recommend the use of medicine for pain. If your sprain is severe, your caregiver may refer you to a surgeon who helps to restore function to parts of your skeletal system (orthopedist) or a physical therapist. °HOME CARE INSTRUCTIONS  °· Apply ice to your injury for 1-2 days or as directed by your caregiver. Applying ice helps to reduce inflammation and pain. °¨ Put ice in a plastic bag. °¨ Place a towel between your skin and the bag. °¨ Leave the ice on for 15-20 minutes at a time, every 2 hours while you are awake. °· Only take over-the-counter or prescription medicines for pain, discomfort, or fever as directed by  your caregiver. °· Elevate your injured ankle above the level of your heart as much as possible for 2-3 days. °· If your caregiver recommends crutches, use them as instructed. Gradually put weight on the affected ankle. Continue to use crutches or a cane until you can walk without feeling pain in your ankle. °· If you have a plaster splint, wear the splint as directed by your caregiver. Do not rest it on anything harder than a pillow for the first 24 hours. Do not put weight on it. Do not get it wet. You may take it off to take a shower or bath. °· You may have been given an elastic bandage to wear around your ankle to provide support. If the elastic bandage is too tight (you have numbness or tingling in your foot or your foot becomes cold and blue), adjust the bandage to make it comfortable. °· If you have an air splint, you may blow more air into it or let air out to make it more comfortable. You may take your splint off at night and before taking a shower or bath. Wiggle your toes in the splint several times per day to decrease swelling. °SEEK MEDICAL CARE IF:  °· You have rapidly increasing bruising or swelling. °· Your toes feel extremely cold or you lose feeling in your foot. °· Your pain is not relieved with medicine. °SEEK IMMEDIATE MEDICAL CARE IF: °· Your toes are numb or blue. °·   You have severe pain that is increasing. MAKE SURE YOU:   Understand these instructions.  Will watch your condition.  Will get help right away if you are not doing well or get worse.   This information is not intended to replace advice given to you by your health care provider. Make sure you discuss any questions you have with your health care provider.   Document Released: 05/12/2005 Document Revised: 06/02/2014 Document Reviewed: 05/24/2011 Elsevier Interactive Patient Education 2016 [employer] A contusion is a deep bruise. Contusions are the result of a blunt injury to tissues and muscle fibers  under the skin. The injury causes bleeding under the skin. The skin overlying the contusion may turn blue, purple, or yellow. Minor injuries will give you a painless contusion, but more severe contusions may stay painful and swollen for a few weeks.  CAUSES  This condition is usually caused by a blow, trauma, or direct force to an area of the body. SYMPTOMS  Symptoms of this condition include:  Swelling of the injured area.  Pain and tenderness in the injured area.  Discoloration. The area may have redness and then turn blue, purple, or yellow. DIAGNOSIS  This condition is diagnosed based on a physical exam and medical history. An X-ray, CT scan, or MRI may be needed to determine if there are any associated injuries, such as broken bones (fractures). TREATMENT  Specific treatment for this condition depends on what area of the body was injured. In general, the best treatment for a contusion is resting, icing, applying pressure to (compression), and elevating the injured area. This is often called the RICE strategy. Over-the-counter anti-inflammatory medicines may also be recommended for pain control.  HOME CARE INSTRUCTIONS   Rest the injured area.  If directed, apply ice to the injured area:  Put ice in a plastic bag.  Place a towel between your skin and the bag.  Leave the ice on for 20 minutes, 2-3 times per day.  If directed, apply light compression to the injured area using an elastic bandage. Make sure the bandage is not wrapped too tightly. Remove and reapply the bandage as directed by your health care provider.  If possible, raise (elevate) the injured area above the level of your heart while you are sitting or lying down.  Take over-the-counter and prescription medicines only as told by your health care provider. SEEK MEDICAL CARE IF:  Your symptoms do not improve after several days of treatment.  Your symptoms get worse.  You have difficulty moving the injured  area. SEEK IMMEDIATE MEDICAL CARE IF:   You have severe pain.  You have numbness in a hand or foot.  Your hand or foot turns pale or cold.   This information is not intended to replace advice given to you by your health care provider. Make sure you discuss any questions you have with your health care provider.   Document Released: 02/19/2005 Document Revised: 01/31/2015 Document Reviewed: 09/27/2014 Elsevier Interactive Patient Education 2016 [employer].  Facial or Scalp Contusion A facial or scalp contusion is a deep bruise on the face or head. Injuries to the face and head generally cause a lot of swelling, especially around the eyes. Contusions are the result of an injury that caused bleeding under the skin. The contusion may turn blue, purple, or yellow. Minor injuries will give you a painless contusion, but more severe contusions may stay painful and swollen for a few weeks.  CAUSES  A facial  or scalp contusion is caused by a blunt injury or trauma to the face or head area.  SIGNS AND SYMPTOMS   Swelling of the injured area.   Discoloration of the injured area.   Tenderness, soreness, or pain in the injured area.  DIAGNOSIS  The diagnosis can be made by taking a medical history and doing a physical exam. An X-ray exam, CT scan, or MRI may be needed to determine if there are any associated injuries, such as broken bones (fractures). TREATMENT  Often, the best treatment for a facial or scalp contusion is applying cold compresses to the injured area. Over-the-counter medicines may also be recommended for pain control.  HOME CARE INSTRUCTIONS   Only take over-the-counter or prescription medicines as directed by your health care provider.   Apply ice to the injured area.   Put ice in a plastic bag.   Place a towel between your skin and the bag.   Leave the ice on for 20 minutes, 2-3 times a day.  SEEK MEDICAL CARE IF:  You have bite problems.   You have pain  with chewing.   You are concerned about facial defects. SEEK IMMEDIATE MEDICAL CARE IF:  You have severe pain or a headache that is not relieved by medicine.   You have unusual sleepiness, confusion, or personality changes.   You throw up (vomit).   You have a persistent nosebleed.   You have double vision or blurred vision.   You have fluid drainage from your nose or ear.   You have difficulty walking or using your arms or legs.  MAKE SURE YOU:   Understand these instructions.  Will watch your condition.  Will get help right away if you are not doing well or get worse.   This information is not intended to replace advice given to you by your health care provider. Make sure you discuss any questions you have with your health care provider.   Document Released: 06/19/2004 Document Revised: 06/02/2014 Document Reviewed: 12/23/2012 Elsevier Interactive Patient Education [employer].

## 2015-04-23 ENCOUNTER — Telehealth: Payer: Self-pay | Admitting: Family Medicine

## 2015-04-23 NOTE — Telephone Encounter (Signed)
Weds 04/23/15 @ 11am

## 2015-04-23 NOTE — Telephone Encounter (Signed)
Call to come in Wed am

## 2015-04-23 NOTE — Telephone Encounter (Signed)
Pt would like to speak to MAC regarding fall fu

## 2015-04-23 NOTE — Telephone Encounter (Signed)
L/M for pt to call and schedule appt for Weds.

## 2015-04-25 ENCOUNTER — Ambulatory Visit (INDEPENDENT_AMBULATORY_CARE_PROVIDER_SITE_OTHER): Payer: Medicare Other | Admitting: Family Medicine

## 2015-04-25 ENCOUNTER — Encounter: Payer: Self-pay | Admitting: Family Medicine

## 2015-04-25 VITALS — BP 104/71 | HR 86 | Temp 97.6°F | Ht 65.0 in | Wt 231.0 lb

## 2015-04-25 DIAGNOSIS — T148 Other injury of unspecified body region: Secondary | ICD-10-CM | POA: Diagnosis not present

## 2015-04-25 DIAGNOSIS — T07XXXA Unspecified multiple injuries, initial encounter: Secondary | ICD-10-CM | POA: Insufficient documentation

## 2015-04-25 NOTE — Assessment & Plan Note (Signed)
Discussed care and treatment Discussed prevention of falls Discuss other safety issues about falling Patient will continue wearing her medical alert bracelet which alerted the family to her fall

## 2015-04-25 NOTE — Progress Notes (Signed)
BP 104/71 mmHg  Pulse 86  Temp(Src) 97.6 F (36.4 C)  Ht 5\' 5"  (1.651 m)  Wt 231 lb (104.781 kg)  BMI 38.44 kg/m2  SpO2 99%   Subjective:    Patient ID: Jennifer Murray, female    DOB: 1927-09-25, 81 y.o.   MRN: [id number]  HPI: Jennifer Murray is a 81 y.o. female  Chief Complaint  Patient presents with  . follow up for fall   see ER notes which were reviewed and discussed with patient who is accompanied by her daughter-in-law. Who is one of her caregivers. Patient all in all doing well bruising and skin tears on arms and face healing well along with abrasions Sprained ankle doing okay Patient with no CNS or neurological residual symptoms or concerns Reviewed again patient with no palpitations skipped heartbeats for signs or symptoms of syncope or concussion Relevant past medical, surgical, family and social history reviewed and updated as indicated. Interim medical history since our last visit reviewed. Allergies and medications reviewed and updated.  Review of Systems  Constitutional: Negative.   Respiratory: Negative.   Cardiovascular: Negative.     Per HPI unless specifically indicated above     Objective:    BP 104/71 mmHg  Pulse 86  Temp(Src) 97.6 F (36.4 C)  Ht 5\' 5"  (1.651 m)  Wt 231 lb (104.781 kg)  BMI 38.44 kg/m2  SpO2 99%  Wt Readings from Last 3 Encounters:  04/25/15 231 lb (104.781 kg)  04/21/15 238 lb (107.956 kg)  04/02/15 230 lb (104.327 kg)    Physical Exam  Constitutional: She is oriented to person, place, and time. She appears well-developed and well-nourished. No distress.  HENT:  Head: Normocephalic and atraumatic.  Right Ear: Hearing normal.  Left Ear: Hearing normal.  Nose: Nose normal.  Eyes: Conjunctivae and lids are normal. Right eye exhibits no discharge. Left eye exhibits no discharge. No scleral icterus.  Pulmonary/Chest: Effort normal. No respiratory distress.  Musculoskeletal: Normal range of motion.  Neurological: She is  alert and oriented to person, place, and time.  Skin: Skin is intact. No rash noted.  Skin tears abrasions and bruises healing nicely  Psychiatric: She has a normal mood and affect. Her speech is normal and behavior is normal. Judgment and thought content normal. Cognition and memory are normal.    Results for orders placed or performed during the hospital encounter of [id number]  Basic metabolic panel  Result Value Ref Range   Sodium 140 135 - 145 mmol/L   Potassium 4.0 3.5 - 5.1 mmol/L   Chloride 108 101 - 111 mmol/L   CO2 27 22 - 32 mmol/L   Glucose, Bld 123 (H) 65 - 99 mg/dL   BUN 20 6 - 20 mg/dL   Creatinine, Ser 0.72 0.44 - 1.00 mg/dL   Calcium 9.8 8.9 - 10.3 mg/dL   GFR calc non Af Amer >60 >60 mL/min   GFR calc Af Amer >60 >60 mL/min   Anion gap 5 5 - 15  CBC  Result Value Ref Range   WBC 9.0 3.6 - 11.0 K/uL   RBC 4.77 3.80 - 5.20 MIL/uL   Hemoglobin 14.4 12.0 - 16.0 g/dL   HCT 43.1 35.0 - 47.0 %   MCV 90.5 80.0 - 100.0 fL   MCH 30.1 26.0 - 34.0 pg   MCHC 33.3 32.0 - 36.0 g/dL   RDW 13.5 11.5 - 14.5 %   Platelets 165 150 - 440 K/uL  Urinalysis complete, with  microscopic ([facility] only)  Result Value Ref Range   Color, Urine YELLOW (A) YELLOW   APPearance HAZY (A) CLEAR   Glucose, UA NEGATIVE NEGATIVE mg/dL   Bilirubin Urine NEGATIVE NEGATIVE   Ketones, ur NEGATIVE NEGATIVE mg/dL   Specific Gravity, Urine 1.017 1.005 - 1.030   Hgb urine dipstick NEGATIVE NEGATIVE   pH 5.0 5.0 - 8.0   Protein, ur NEGATIVE NEGATIVE mg/dL   Nitrite NEGATIVE NEGATIVE   Leukocytes, UA TRACE (A) NEGATIVE   RBC / HPF 0-5 0 - 5 RBC/hpf   WBC, UA 0-5 0 - 5 WBC/hpf   Bacteria, UA RARE (A) NONE SEEN   Squamous Epithelial / LPF 0-5 (A) NONE SEEN   Mucous PRESENT       Assessment & Plan:   Problem List Items Addressed This Visit      Other   Multiple contusions - Primary    Discussed care and treatment Discussed prevention of falls Discuss other safety issues about falling Patient will  continue wearing her medical alert bracelet which alerted the family to her fall          Follow up plan: Return if symptoms worsen or fail to improve, for As scheduled.

## 2015-05-24 DIAGNOSIS — R2681 Unsteadiness on feet: Secondary | ICD-10-CM | POA: Diagnosis not present

## 2015-05-24 DIAGNOSIS — R600 Localized edema: Secondary | ICD-10-CM | POA: Diagnosis not present

## 2015-05-24 DIAGNOSIS — I10 Essential (primary) hypertension: Secondary | ICD-10-CM | POA: Diagnosis not present

## 2015-05-29 DIAGNOSIS — Z961 Presence of intraocular lens: Secondary | ICD-10-CM | POA: Diagnosis not present

## 2015-06-01 DIAGNOSIS — Z9181 History of falling: Secondary | ICD-10-CM | POA: Diagnosis not present

## 2015-06-01 DIAGNOSIS — I10 Essential (primary) hypertension: Secondary | ICD-10-CM | POA: Diagnosis not present

## 2015-06-01 DIAGNOSIS — M159 Polyosteoarthritis, unspecified: Secondary | ICD-10-CM | POA: Diagnosis not present

## 2015-06-01 DIAGNOSIS — M4806 Spinal stenosis, lumbar region: Secondary | ICD-10-CM | POA: Diagnosis not present

## 2015-06-01 DIAGNOSIS — M6281 Muscle weakness (generalized): Secondary | ICD-10-CM | POA: Diagnosis not present

## 2015-06-01 DIAGNOSIS — Z8673 Personal history of transient ischemic attack (TIA), and cerebral infarction without residual deficits: Secondary | ICD-10-CM | POA: Diagnosis not present

## 2015-06-01 DIAGNOSIS — M810 Age-related osteoporosis without current pathological fracture: Secondary | ICD-10-CM | POA: Diagnosis not present

## 2015-06-01 DIAGNOSIS — R609 Edema, unspecified: Secondary | ICD-10-CM | POA: Diagnosis not present

## 2015-06-06 DIAGNOSIS — M159 Polyosteoarthritis, unspecified: Secondary | ICD-10-CM | POA: Diagnosis not present

## 2015-06-06 DIAGNOSIS — I10 Essential (primary) hypertension: Secondary | ICD-10-CM | POA: Diagnosis not present

## 2015-06-06 DIAGNOSIS — M4806 Spinal stenosis, lumbar region: Secondary | ICD-10-CM | POA: Diagnosis not present

## 2015-06-06 DIAGNOSIS — R609 Edema, unspecified: Secondary | ICD-10-CM | POA: Diagnosis not present

## 2015-06-06 DIAGNOSIS — M6281 Muscle weakness (generalized): Secondary | ICD-10-CM | POA: Diagnosis not present

## 2015-06-06 DIAGNOSIS — M810 Age-related osteoporosis without current pathological fracture: Secondary | ICD-10-CM | POA: Diagnosis not present

## 2015-06-08 DIAGNOSIS — M6281 Muscle weakness (generalized): Secondary | ICD-10-CM | POA: Diagnosis not present

## 2015-06-08 DIAGNOSIS — I10 Essential (primary) hypertension: Secondary | ICD-10-CM | POA: Diagnosis not present

## 2015-06-08 DIAGNOSIS — M4806 Spinal stenosis, lumbar region: Secondary | ICD-10-CM | POA: Diagnosis not present

## 2015-06-08 DIAGNOSIS — M810 Age-related osteoporosis without current pathological fracture: Secondary | ICD-10-CM | POA: Diagnosis not present

## 2015-06-08 DIAGNOSIS — M159 Polyosteoarthritis, unspecified: Secondary | ICD-10-CM | POA: Diagnosis not present

## 2015-06-08 DIAGNOSIS — R609 Edema, unspecified: Secondary | ICD-10-CM | POA: Diagnosis not present

## 2015-06-11 DIAGNOSIS — I890 Lymphedema, not elsewhere classified: Secondary | ICD-10-CM | POA: Diagnosis not present

## 2015-06-11 DIAGNOSIS — M79609 Pain in unspecified limb: Secondary | ICD-10-CM | POA: Diagnosis not present

## 2015-06-11 DIAGNOSIS — M7989 Other specified soft tissue disorders: Secondary | ICD-10-CM | POA: Diagnosis not present

## 2015-06-11 DIAGNOSIS — I872 Venous insufficiency (chronic) (peripheral): Secondary | ICD-10-CM | POA: Diagnosis not present

## 2015-06-12 DIAGNOSIS — I10 Essential (primary) hypertension: Secondary | ICD-10-CM | POA: Diagnosis not present

## 2015-06-12 DIAGNOSIS — M4806 Spinal stenosis, lumbar region: Secondary | ICD-10-CM | POA: Diagnosis not present

## 2015-06-12 DIAGNOSIS — M6281 Muscle weakness (generalized): Secondary | ICD-10-CM | POA: Diagnosis not present

## 2015-06-12 DIAGNOSIS — M159 Polyosteoarthritis, unspecified: Secondary | ICD-10-CM | POA: Diagnosis not present

## 2015-06-12 DIAGNOSIS — M810 Age-related osteoporosis without current pathological fracture: Secondary | ICD-10-CM | POA: Diagnosis not present

## 2015-06-12 DIAGNOSIS — R609 Edema, unspecified: Secondary | ICD-10-CM | POA: Diagnosis not present

## 2015-06-15 DIAGNOSIS — R609 Edema, unspecified: Secondary | ICD-10-CM | POA: Diagnosis not present

## 2015-06-15 DIAGNOSIS — M159 Polyosteoarthritis, unspecified: Secondary | ICD-10-CM | POA: Diagnosis not present

## 2015-06-15 DIAGNOSIS — M4806 Spinal stenosis, lumbar region: Secondary | ICD-10-CM | POA: Diagnosis not present

## 2015-06-15 DIAGNOSIS — M810 Age-related osteoporosis without current pathological fracture: Secondary | ICD-10-CM | POA: Diagnosis not present

## 2015-06-15 DIAGNOSIS — M6281 Muscle weakness (generalized): Secondary | ICD-10-CM | POA: Diagnosis not present

## 2015-06-15 DIAGNOSIS — I10 Essential (primary) hypertension: Secondary | ICD-10-CM | POA: Diagnosis not present

## 2015-06-18 DIAGNOSIS — M159 Polyosteoarthritis, unspecified: Secondary | ICD-10-CM | POA: Diagnosis not present

## 2015-06-18 DIAGNOSIS — M810 Age-related osteoporosis without current pathological fracture: Secondary | ICD-10-CM | POA: Diagnosis not present

## 2015-06-18 DIAGNOSIS — M4806 Spinal stenosis, lumbar region: Secondary | ICD-10-CM | POA: Diagnosis not present

## 2015-06-18 DIAGNOSIS — R609 Edema, unspecified: Secondary | ICD-10-CM | POA: Diagnosis not present

## 2015-06-18 DIAGNOSIS — I10 Essential (primary) hypertension: Secondary | ICD-10-CM | POA: Diagnosis not present

## 2015-06-18 DIAGNOSIS — M6281 Muscle weakness (generalized): Secondary | ICD-10-CM | POA: Diagnosis not present

## 2015-06-19 DIAGNOSIS — I10 Essential (primary) hypertension: Secondary | ICD-10-CM | POA: Diagnosis not present

## 2015-06-19 DIAGNOSIS — M6281 Muscle weakness (generalized): Secondary | ICD-10-CM | POA: Diagnosis not present

## 2015-06-19 DIAGNOSIS — M810 Age-related osteoporosis without current pathological fracture: Secondary | ICD-10-CM | POA: Diagnosis not present

## 2015-06-19 DIAGNOSIS — M159 Polyosteoarthritis, unspecified: Secondary | ICD-10-CM | POA: Diagnosis not present

## 2015-06-19 DIAGNOSIS — M4806 Spinal stenosis, lumbar region: Secondary | ICD-10-CM | POA: Diagnosis not present

## 2015-06-19 DIAGNOSIS — R609 Edema, unspecified: Secondary | ICD-10-CM | POA: Diagnosis not present

## 2015-06-20 DIAGNOSIS — M6281 Muscle weakness (generalized): Secondary | ICD-10-CM | POA: Diagnosis not present

## 2015-06-20 DIAGNOSIS — I10 Essential (primary) hypertension: Secondary | ICD-10-CM | POA: Diagnosis not present

## 2015-06-20 DIAGNOSIS — R609 Edema, unspecified: Secondary | ICD-10-CM | POA: Diagnosis not present

## 2015-06-20 DIAGNOSIS — M4806 Spinal stenosis, lumbar region: Secondary | ICD-10-CM | POA: Diagnosis not present

## 2015-06-20 DIAGNOSIS — M159 Polyosteoarthritis, unspecified: Secondary | ICD-10-CM | POA: Diagnosis not present

## 2015-06-20 DIAGNOSIS — M810 Age-related osteoporosis without current pathological fracture: Secondary | ICD-10-CM | POA: Diagnosis not present

## 2015-06-22 DIAGNOSIS — M159 Polyosteoarthritis, unspecified: Secondary | ICD-10-CM | POA: Diagnosis not present

## 2015-06-22 DIAGNOSIS — M6281 Muscle weakness (generalized): Secondary | ICD-10-CM | POA: Diagnosis not present

## 2015-06-22 DIAGNOSIS — R609 Edema, unspecified: Secondary | ICD-10-CM | POA: Diagnosis not present

## 2015-06-22 DIAGNOSIS — M4806 Spinal stenosis, lumbar region: Secondary | ICD-10-CM | POA: Diagnosis not present

## 2015-06-22 DIAGNOSIS — I10 Essential (primary) hypertension: Secondary | ICD-10-CM | POA: Diagnosis not present

## 2015-06-22 DIAGNOSIS — M810 Age-related osteoporosis without current pathological fracture: Secondary | ICD-10-CM | POA: Diagnosis not present

## 2015-06-27 DIAGNOSIS — M810 Age-related osteoporosis without current pathological fracture: Secondary | ICD-10-CM | POA: Diagnosis not present

## 2015-06-27 DIAGNOSIS — M4806 Spinal stenosis, lumbar region: Secondary | ICD-10-CM | POA: Diagnosis not present

## 2015-06-27 DIAGNOSIS — I10 Essential (primary) hypertension: Secondary | ICD-10-CM | POA: Diagnosis not present

## 2015-06-27 DIAGNOSIS — M159 Polyosteoarthritis, unspecified: Secondary | ICD-10-CM | POA: Diagnosis not present

## 2015-06-27 DIAGNOSIS — R609 Edema, unspecified: Secondary | ICD-10-CM | POA: Diagnosis not present

## 2015-06-27 DIAGNOSIS — M6281 Muscle weakness (generalized): Secondary | ICD-10-CM | POA: Diagnosis not present

## 2015-06-28 DIAGNOSIS — M810 Age-related osteoporosis without current pathological fracture: Secondary | ICD-10-CM | POA: Diagnosis not present

## 2015-06-28 DIAGNOSIS — M6281 Muscle weakness (generalized): Secondary | ICD-10-CM | POA: Diagnosis not present

## 2015-06-28 DIAGNOSIS — M159 Polyosteoarthritis, unspecified: Secondary | ICD-10-CM | POA: Diagnosis not present

## 2015-06-28 DIAGNOSIS — I10 Essential (primary) hypertension: Secondary | ICD-10-CM | POA: Diagnosis not present

## 2015-06-28 DIAGNOSIS — R609 Edema, unspecified: Secondary | ICD-10-CM | POA: Diagnosis not present

## 2015-06-28 DIAGNOSIS — M4806 Spinal stenosis, lumbar region: Secondary | ICD-10-CM | POA: Diagnosis not present

## 2015-06-29 DIAGNOSIS — M4806 Spinal stenosis, lumbar region: Secondary | ICD-10-CM | POA: Diagnosis not present

## 2015-06-29 DIAGNOSIS — I10 Essential (primary) hypertension: Secondary | ICD-10-CM | POA: Diagnosis not present

## 2015-06-29 DIAGNOSIS — R609 Edema, unspecified: Secondary | ICD-10-CM | POA: Diagnosis not present

## 2015-06-29 DIAGNOSIS — M6281 Muscle weakness (generalized): Secondary | ICD-10-CM | POA: Diagnosis not present

## 2015-06-29 DIAGNOSIS — M159 Polyosteoarthritis, unspecified: Secondary | ICD-10-CM | POA: Diagnosis not present

## 2015-06-29 DIAGNOSIS — M810 Age-related osteoporosis without current pathological fracture: Secondary | ICD-10-CM | POA: Diagnosis not present

## 2015-07-03 DIAGNOSIS — M4806 Spinal stenosis, lumbar region: Secondary | ICD-10-CM | POA: Diagnosis not present

## 2015-07-03 DIAGNOSIS — R609 Edema, unspecified: Secondary | ICD-10-CM | POA: Diagnosis not present

## 2015-07-03 DIAGNOSIS — M810 Age-related osteoporosis without current pathological fracture: Secondary | ICD-10-CM | POA: Diagnosis not present

## 2015-07-03 DIAGNOSIS — M6281 Muscle weakness (generalized): Secondary | ICD-10-CM | POA: Diagnosis not present

## 2015-07-03 DIAGNOSIS — I10 Essential (primary) hypertension: Secondary | ICD-10-CM | POA: Diagnosis not present

## 2015-07-03 DIAGNOSIS — M159 Polyosteoarthritis, unspecified: Secondary | ICD-10-CM | POA: Diagnosis not present

## 2015-07-06 DIAGNOSIS — M159 Polyosteoarthritis, unspecified: Secondary | ICD-10-CM | POA: Diagnosis not present

## 2015-07-06 DIAGNOSIS — M19011 Primary osteoarthritis, right shoulder: Secondary | ICD-10-CM | POA: Diagnosis not present

## 2015-07-06 DIAGNOSIS — R609 Edema, unspecified: Secondary | ICD-10-CM | POA: Diagnosis not present

## 2015-07-06 DIAGNOSIS — M19012 Primary osteoarthritis, left shoulder: Secondary | ICD-10-CM | POA: Diagnosis not present

## 2015-07-06 DIAGNOSIS — M4806 Spinal stenosis, lumbar region: Secondary | ICD-10-CM | POA: Diagnosis not present

## 2015-07-06 DIAGNOSIS — M810 Age-related osteoporosis without current pathological fracture: Secondary | ICD-10-CM | POA: Diagnosis not present

## 2015-07-06 DIAGNOSIS — I10 Essential (primary) hypertension: Secondary | ICD-10-CM | POA: Diagnosis not present

## 2015-07-06 DIAGNOSIS — M6281 Muscle weakness (generalized): Secondary | ICD-10-CM | POA: Diagnosis not present

## 2015-07-10 DIAGNOSIS — M4806 Spinal stenosis, lumbar region: Secondary | ICD-10-CM | POA: Diagnosis not present

## 2015-07-10 DIAGNOSIS — I10 Essential (primary) hypertension: Secondary | ICD-10-CM | POA: Diagnosis not present

## 2015-07-10 DIAGNOSIS — R609 Edema, unspecified: Secondary | ICD-10-CM | POA: Diagnosis not present

## 2015-07-10 DIAGNOSIS — M6281 Muscle weakness (generalized): Secondary | ICD-10-CM | POA: Diagnosis not present

## 2015-07-10 DIAGNOSIS — M810 Age-related osteoporosis without current pathological fracture: Secondary | ICD-10-CM | POA: Diagnosis not present

## 2015-07-10 DIAGNOSIS — M159 Polyosteoarthritis, unspecified: Secondary | ICD-10-CM | POA: Diagnosis not present

## 2015-07-17 DIAGNOSIS — M6281 Muscle weakness (generalized): Secondary | ICD-10-CM | POA: Diagnosis not present

## 2015-07-17 DIAGNOSIS — I10 Essential (primary) hypertension: Secondary | ICD-10-CM | POA: Diagnosis not present

## 2015-07-17 DIAGNOSIS — R609 Edema, unspecified: Secondary | ICD-10-CM | POA: Diagnosis not present

## 2015-07-17 DIAGNOSIS — M810 Age-related osteoporosis without current pathological fracture: Secondary | ICD-10-CM | POA: Diagnosis not present

## 2015-07-17 DIAGNOSIS — M159 Polyosteoarthritis, unspecified: Secondary | ICD-10-CM | POA: Diagnosis not present

## 2015-07-17 DIAGNOSIS — M4806 Spinal stenosis, lumbar region: Secondary | ICD-10-CM | POA: Diagnosis not present

## 2015-07-18 DIAGNOSIS — M810 Age-related osteoporosis without current pathological fracture: Secondary | ICD-10-CM | POA: Diagnosis not present

## 2015-07-18 DIAGNOSIS — I10 Essential (primary) hypertension: Secondary | ICD-10-CM | POA: Diagnosis not present

## 2015-07-18 DIAGNOSIS — M4806 Spinal stenosis, lumbar region: Secondary | ICD-10-CM | POA: Diagnosis not present

## 2015-07-18 DIAGNOSIS — M6281 Muscle weakness (generalized): Secondary | ICD-10-CM | POA: Diagnosis not present

## 2015-07-18 DIAGNOSIS — M159 Polyosteoarthritis, unspecified: Secondary | ICD-10-CM | POA: Diagnosis not present

## 2015-07-18 DIAGNOSIS — R609 Edema, unspecified: Secondary | ICD-10-CM | POA: Diagnosis not present

## 2015-07-20 DIAGNOSIS — M810 Age-related osteoporosis without current pathological fracture: Secondary | ICD-10-CM | POA: Diagnosis not present

## 2015-07-20 DIAGNOSIS — I10 Essential (primary) hypertension: Secondary | ICD-10-CM | POA: Diagnosis not present

## 2015-07-20 DIAGNOSIS — M4806 Spinal stenosis, lumbar region: Secondary | ICD-10-CM | POA: Diagnosis not present

## 2015-07-20 DIAGNOSIS — M159 Polyosteoarthritis, unspecified: Secondary | ICD-10-CM | POA: Diagnosis not present

## 2015-07-20 DIAGNOSIS — R609 Edema, unspecified: Secondary | ICD-10-CM | POA: Diagnosis not present

## 2015-07-20 DIAGNOSIS — M6281 Muscle weakness (generalized): Secondary | ICD-10-CM | POA: Diagnosis not present

## 2015-07-25 DIAGNOSIS — I10 Essential (primary) hypertension: Secondary | ICD-10-CM | POA: Diagnosis not present

## 2015-07-25 DIAGNOSIS — M6281 Muscle weakness (generalized): Secondary | ICD-10-CM | POA: Diagnosis not present

## 2015-07-25 DIAGNOSIS — M159 Polyosteoarthritis, unspecified: Secondary | ICD-10-CM | POA: Diagnosis not present

## 2015-07-25 DIAGNOSIS — M4806 Spinal stenosis, lumbar region: Secondary | ICD-10-CM | POA: Diagnosis not present

## 2015-07-25 DIAGNOSIS — R609 Edema, unspecified: Secondary | ICD-10-CM | POA: Diagnosis not present

## 2015-07-25 DIAGNOSIS — M810 Age-related osteoporosis without current pathological fracture: Secondary | ICD-10-CM | POA: Diagnosis not present

## 2015-07-27 DIAGNOSIS — M4806 Spinal stenosis, lumbar region: Secondary | ICD-10-CM | POA: Diagnosis not present

## 2015-07-27 DIAGNOSIS — M6281 Muscle weakness (generalized): Secondary | ICD-10-CM | POA: Diagnosis not present

## 2015-07-27 DIAGNOSIS — R609 Edema, unspecified: Secondary | ICD-10-CM | POA: Diagnosis not present

## 2015-07-27 DIAGNOSIS — I10 Essential (primary) hypertension: Secondary | ICD-10-CM | POA: Diagnosis not present

## 2015-07-27 DIAGNOSIS — M159 Polyosteoarthritis, unspecified: Secondary | ICD-10-CM | POA: Diagnosis not present

## 2015-07-27 DIAGNOSIS — M810 Age-related osteoporosis without current pathological fracture: Secondary | ICD-10-CM | POA: Diagnosis not present

## 2015-08-02 ENCOUNTER — Encounter: Payer: Self-pay | Admitting: *Deleted

## 2015-09-28 DIAGNOSIS — M1711 Unilateral primary osteoarthritis, right knee: Secondary | ICD-10-CM | POA: Diagnosis not present

## 2015-09-28 DIAGNOSIS — M19012 Primary osteoarthritis, left shoulder: Secondary | ICD-10-CM | POA: Diagnosis not present

## 2015-10-01 ENCOUNTER — Encounter: Payer: Self-pay | Admitting: Family Medicine

## 2015-10-01 ENCOUNTER — Ambulatory Visit (INDEPENDENT_AMBULATORY_CARE_PROVIDER_SITE_OTHER): Payer: Medicare Other | Admitting: Family Medicine

## 2015-10-01 VITALS — BP 119/86 | HR 91 | Temp 98.0°F | Ht 66.0 in | Wt 240.0 lb

## 2015-10-01 DIAGNOSIS — R600 Localized edema: Secondary | ICD-10-CM | POA: Diagnosis not present

## 2015-10-01 DIAGNOSIS — G458 Other transient cerebral ischemic attacks and related syndromes: Secondary | ICD-10-CM | POA: Diagnosis not present

## 2015-10-01 DIAGNOSIS — R5383 Other fatigue: Secondary | ICD-10-CM | POA: Diagnosis not present

## 2015-10-01 DIAGNOSIS — M4806 Spinal stenosis, lumbar region: Secondary | ICD-10-CM | POA: Diagnosis not present

## 2015-10-01 DIAGNOSIS — Z0000 Encounter for general adult medical examination without abnormal findings: Secondary | ICD-10-CM

## 2015-10-01 DIAGNOSIS — I10 Essential (primary) hypertension: Secondary | ICD-10-CM

## 2015-10-01 DIAGNOSIS — E662 Morbid (severe) obesity with alveolar hypoventilation: Secondary | ICD-10-CM | POA: Diagnosis not present

## 2015-10-01 DIAGNOSIS — M48061 Spinal stenosis, lumbar region without neurogenic claudication: Secondary | ICD-10-CM

## 2015-10-01 LAB — URINALYSIS, ROUTINE W REFLEX MICROSCOPIC
BILIRUBIN UA: NEGATIVE
Glucose, UA: NEGATIVE
Ketones, UA: NEGATIVE
Leukocytes, UA: NEGATIVE
NITRITE UA: NEGATIVE
PH UA: 5.5 (ref 5.0–7.5)
PROTEIN UA: NEGATIVE
RBC UA: NEGATIVE
Specific Gravity, UA: 1.015 (ref 1.005–1.030)
UUROB: 0.2 mg/dL (ref 0.2–1.0)

## 2015-10-01 MED ORDER — FUROSEMIDE 20 MG PO TABS: 20.0000 mg | ORAL_TABLET | Freq: Every day | ORAL | Status: DC | PRN

## 2015-10-01 MED ORDER — CLOPIDOGREL BISULFATE 75 MG PO TABS: ORAL_TABLET | ORAL | Status: DC

## 2015-10-01 MED ORDER — RAMIPRIL 2.5 MG PO CAPS: 2.5000 mg | ORAL_CAPSULE | Freq: Every day | ORAL | Status: DC

## 2015-10-01 NOTE — Progress Notes (Signed)
BP 119/86 mmHg  Pulse 91  Temp(Src) 98 F (36.7 C)  Ht 5' 6"  (1.676 m)  Wt 240 lb (108.863 kg)  BMI 38.76 kg/m2  SpO2 96%   Subjective:    Patient ID: Jennifer Murray, female    DOB: [birth year], 82 y.o.   MRN: [id number]  HPI: Jennifer Murray is a 82 y.o. female  Chief Complaint  Patient presents with  . Annual Exam  Patient accompanied by daughter who assists with history Patient spinal stenosis is largely pain-free having difficulty with walking uses 4 point walker Patient with marked lymphedema has lymphedema pump supposed to wear an hour twice a day which she doesn't do also Velcro wrap socks which he doesn't wear most of the time because of difficulty getting them on. Reviewed blood pressure doing well no complaints Patient's fluid status doing well with no PND orthopnea Takes Plavix without excessive bleeding or bruising  Relevant past medical, surgical, family and social history reviewed and updated as indicated. Interim medical history since our last visit reviewed. Allergies and medications reviewed and updated.  Other than above Review of Systems  Constitutional: Negative.   HENT: Negative.   Eyes: Negative.   Respiratory: Negative.   Cardiovascular: Negative.   Gastrointestinal: Negative.   Endocrine: Negative.   Genitourinary: Negative.   Musculoskeletal: Negative.   Skin: Negative.   Allergic/Immunologic: Negative.   Neurological: Negative.   Hematological: Negative.   Psychiatric/Behavioral: Negative.    Medicare metrics met Per HPI unless specifically indicated above     Objective:    BP 119/86 mmHg  Pulse 91  Temp(Src) 98 F (36.7 C)  Ht 5' 6"  (1.676 m)  Wt 240 lb (108.863 kg)  BMI 38.76 kg/m2  SpO2 96%  Wt Readings from Last 3 Encounters:  10/01/15 240 lb (108.863 kg)  04/25/15 231 lb (104.781 kg)  04/21/15 238 lb (107.956 kg)    Physical Exam  Constitutional: She is oriented to person, place, and time. She appears well-developed and  well-nourished.  HENT:  Head: Normocephalic and atraumatic.  Right Ear: External ear normal.  Left Ear: External ear normal.  Nose: Nose normal.  Mouth/Throat: Oropharynx is clear and moist.  Eyes: Conjunctivae and EOM are normal. Pupils are equal, round, and reactive to light.  Neck: Normal range of motion. Neck supple. Carotid bruit is not present.  Cardiovascular: Normal rate, regular rhythm and normal heart sounds.   No murmur heard. Pulmonary/Chest: Effort normal and breath sounds normal. She exhibits no mass. Right breast exhibits no mass, no skin change and no tenderness. Left breast exhibits no mass, no skin change and no tenderness. Breasts are symmetrical.  Abdominal: Soft. Bowel sounds are normal. There is no hepatosplenomegaly.  Musculoskeletal: Normal range of motion.  Neurological: She is alert and oriented to person, place, and time.  Skin: No rash noted.  Psychiatric: She has a normal mood and affect. Her behavior is normal. Judgment and thought content normal.    Results for orders placed or performed during the hospital encounter of 54/49/20  Basic metabolic panel  Result Value Ref Range   Sodium 140 135 - 145 mmol/L   Potassium 4.0 3.5 - 5.1 mmol/L   Chloride 108 101 - 111 mmol/L   CO2 27 22 - 32 mmol/L   Glucose, Bld 123 (H) 65 - 99 mg/dL   BUN 20 6 - 20 mg/dL   Creatinine, Ser 0.72 0.44 - 1.00 mg/dL   Calcium 9.8 8.9 - 10.3 mg/dL  GFR calc non Af Amer >60 >60 mL/min   GFR calc Af Amer >60 >60 mL/min   Anion gap 5 5 - 15  CBC  Result Value Ref Range   WBC 9.0 3.6 - 11.0 K/uL   RBC 4.77 3.80 - 5.20 MIL/uL   Hemoglobin 14.4 12.0 - 16.0 g/dL   HCT 43.1 35.0 - 47.0 %   MCV 90.5 80.0 - 100.0 fL   MCH 30.1 26.0 - 34.0 pg   MCHC 33.3 32.0 - 36.0 g/dL   RDW 13.5 11.5 - 14.5 %   Platelets 165 150 - 440 K/uL  Urinalysis complete, with microscopic ([facility] only)  Result Value Ref Range   Color, Urine YELLOW (A) YELLOW   APPearance HAZY (A) CLEAR   Glucose, UA  NEGATIVE NEGATIVE mg/dL   Bilirubin Urine NEGATIVE NEGATIVE   Ketones, ur NEGATIVE NEGATIVE mg/dL   Specific Gravity, Urine 1.017 1.005 - 1.030   Hgb urine dipstick NEGATIVE NEGATIVE   pH 5.0 5.0 - 8.0   Protein, ur NEGATIVE NEGATIVE mg/dL   Nitrite NEGATIVE NEGATIVE   Leukocytes, UA TRACE (A) NEGATIVE   RBC / HPF 0-5 0 - 5 RBC/hpf   WBC, UA 0-5 0 - 5 WBC/hpf   Bacteria, UA RARE (A) NONE SEEN   Squamous Epithelial / LPF 0-5 (A) NONE SEEN   Mucous PRESENT       Assessment & Plan:   Problem List Items Addressed This Visit      Cardiovascular and Mediastinum   Essential hypertension    The current medical regimen is effective;  continue present plan and medications.       Relevant Medications   ramipril (ALTACE) 2.5 MG capsule   furosemide (LASIX) 20 MG tablet     Other   Lumbar spinal stenosis    No pain for now      Leg edema    Using compression and lymph pump       Other Visit Diagnoses    Routine general medical examination at a health care facility    -  Primary    Relevant Orders    CBC with Differential/Platelet    Comprehensive metabolic panel    Lipid Panel w/o Chol/HDL Ratio    TSH    Urinalysis, Routine w reflex microscopic (not at [facility])    Other fatigue        Relevant Orders    CBC with Differential/Platelet    TSH        Follow up plan: Return in about 6 months (around 04/02/2016) for BMP.

## 2015-10-01 NOTE — Assessment & Plan Note (Signed)
Using compression and lymph pump

## 2015-10-01 NOTE — Assessment & Plan Note (Signed)
No pain for now

## 2015-10-01 NOTE — Assessment & Plan Note (Signed)
The current medical regimen is effective;  continue present plan and medications.  

## 2015-10-02 ENCOUNTER — Encounter: Payer: Self-pay | Admitting: Family Medicine

## 2015-10-02 LAB — CBC WITH DIFFERENTIAL/PLATELET
BASOS ABS: 0 10*3/uL (ref 0.0–0.2)
BASOS: 0 %
EOS (ABSOLUTE): 0.2 10*3/uL (ref 0.0–0.4)
Eos: 2 %
Hematocrit: 44.6 % (ref 34.0–46.6)
Hemoglobin: 14.9 g/dL (ref 11.1–15.9)
IMMATURE GRANS (ABS): 0 10*3/uL (ref 0.0–0.1)
Immature Granulocytes: 0 %
LYMPHS ABS: 2.8 10*3/uL (ref 0.7–3.1)
Lymphs: 29 %
MCH: 30.4 pg (ref 26.6–33.0)
MCHC: 33.4 g/dL (ref 31.5–35.7)
MCV: 91 fL (ref 79–97)
MONOS ABS: 1.1 10*3/uL — AB (ref 0.1–0.9)
Monocytes: 11 %
NEUTROS ABS: 5.6 10*3/uL (ref 1.4–7.0)
Neutrophils: 58 %
PLATELETS: 222 10*3/uL (ref 150–379)
RBC: 4.9 x10E6/uL (ref 3.77–5.28)
RDW: 13.6 % (ref 12.3–15.4)
WBC: 9.6 10*3/uL (ref 3.4–10.8)

## 2015-10-02 LAB — COMPREHENSIVE METABOLIC PANEL
A/G RATIO: 1.5 (ref 1.2–2.2)
ALK PHOS: 108 IU/L (ref 39–117)
ALT: 18 IU/L (ref 0–32)
AST: 23 IU/L (ref 0–40)
Albumin: 4.3 g/dL (ref 3.5–4.7)
BILIRUBIN TOTAL: 0.5 mg/dL (ref 0.0–1.2)
BUN / CREAT RATIO: 30 — AB (ref 12–28)
BUN: 21 mg/dL (ref 8–27)
CO2: 25 mmol/L (ref 18–29)
Calcium: 11 mg/dL — ABNORMAL HIGH (ref 8.7–10.3)
Chloride: 99 mmol/L (ref 96–106)
Creatinine, Ser: 0.69 mg/dL (ref 0.57–1.00)
GFR calc Af Amer: 91 mL/min/{1.73_m2} (ref >59)
GFR calc non Af Amer: 79 mL/min/{1.73_m2} (ref >59)
GLUCOSE: 96 mg/dL (ref 65–99)
Globulin, Total: 2.8 g/dL (ref 1.5–4.5)
Potassium: 4.3 mmol/L (ref 3.5–5.2)
SODIUM: 140 mmol/L (ref 134–144)
TOTAL PROTEIN: 7.1 g/dL (ref 6.0–8.5)

## 2015-10-02 LAB — LIPID PANEL W/O CHOL/HDL RATIO
Cholesterol, Total: 190 mg/dL (ref 100–199)
HDL: 50 mg/dL (ref >39)
LDL CALC: 112 mg/dL — AB (ref 0–99)
Triglycerides: 139 mg/dL (ref 0–149)
VLDL Cholesterol Cal: 28 mg/dL (ref 5–40)

## 2015-10-02 LAB — TSH: TSH: 1.98 u[IU]/mL (ref 0.450–4.500)

## 2015-10-08 ENCOUNTER — Encounter: Payer: Medicare Other | Admitting: Family Medicine

## 2015-10-10 DIAGNOSIS — M1711 Unilateral primary osteoarthritis, right knee: Secondary | ICD-10-CM | POA: Diagnosis not present

## 2015-10-15 DIAGNOSIS — M4800 Spinal stenosis, site unspecified: Secondary | ICD-10-CM | POA: Diagnosis not present

## 2015-10-15 DIAGNOSIS — M1711 Unilateral primary osteoarthritis, right knee: Secondary | ICD-10-CM | POA: Diagnosis not present

## 2015-10-15 DIAGNOSIS — M19011 Primary osteoarthritis, right shoulder: Secondary | ICD-10-CM | POA: Diagnosis not present

## 2015-10-15 DIAGNOSIS — I10 Essential (primary) hypertension: Secondary | ICD-10-CM | POA: Diagnosis not present

## 2015-10-15 DIAGNOSIS — M19012 Primary osteoarthritis, left shoulder: Secondary | ICD-10-CM | POA: Diagnosis not present

## 2015-10-15 DIAGNOSIS — I872 Venous insufficiency (chronic) (peripheral): Secondary | ICD-10-CM | POA: Diagnosis not present

## 2015-10-17 DIAGNOSIS — M1711 Unilateral primary osteoarthritis, right knee: Secondary | ICD-10-CM | POA: Diagnosis not present

## 2015-10-18 DIAGNOSIS — M1711 Unilateral primary osteoarthritis, right knee: Secondary | ICD-10-CM | POA: Diagnosis not present

## 2015-10-19 DIAGNOSIS — M19012 Primary osteoarthritis, left shoulder: Secondary | ICD-10-CM | POA: Diagnosis not present

## 2015-10-19 DIAGNOSIS — M19011 Primary osteoarthritis, right shoulder: Secondary | ICD-10-CM | POA: Diagnosis not present

## 2015-10-19 DIAGNOSIS — I10 Essential (primary) hypertension: Secondary | ICD-10-CM | POA: Diagnosis not present

## 2015-10-19 DIAGNOSIS — I872 Venous insufficiency (chronic) (peripheral): Secondary | ICD-10-CM | POA: Diagnosis not present

## 2015-10-19 DIAGNOSIS — M1711 Unilateral primary osteoarthritis, right knee: Secondary | ICD-10-CM | POA: Diagnosis not present

## 2015-10-19 DIAGNOSIS — M4800 Spinal stenosis, site unspecified: Secondary | ICD-10-CM | POA: Diagnosis not present

## 2015-10-23 DIAGNOSIS — M19011 Primary osteoarthritis, right shoulder: Secondary | ICD-10-CM | POA: Diagnosis not present

## 2015-10-23 DIAGNOSIS — M19012 Primary osteoarthritis, left shoulder: Secondary | ICD-10-CM | POA: Diagnosis not present

## 2015-10-23 DIAGNOSIS — I10 Essential (primary) hypertension: Secondary | ICD-10-CM | POA: Diagnosis not present

## 2015-10-23 DIAGNOSIS — I872 Venous insufficiency (chronic) (peripheral): Secondary | ICD-10-CM | POA: Diagnosis not present

## 2015-10-23 DIAGNOSIS — M4800 Spinal stenosis, site unspecified: Secondary | ICD-10-CM | POA: Diagnosis not present

## 2015-10-23 DIAGNOSIS — M1711 Unilateral primary osteoarthritis, right knee: Secondary | ICD-10-CM | POA: Diagnosis not present

## 2015-10-25 DIAGNOSIS — I872 Venous insufficiency (chronic) (peripheral): Secondary | ICD-10-CM | POA: Diagnosis not present

## 2015-10-25 DIAGNOSIS — M19011 Primary osteoarthritis, right shoulder: Secondary | ICD-10-CM | POA: Diagnosis not present

## 2015-10-25 DIAGNOSIS — M4800 Spinal stenosis, site unspecified: Secondary | ICD-10-CM | POA: Diagnosis not present

## 2015-10-25 DIAGNOSIS — I10 Essential (primary) hypertension: Secondary | ICD-10-CM | POA: Diagnosis not present

## 2015-10-25 DIAGNOSIS — M19012 Primary osteoarthritis, left shoulder: Secondary | ICD-10-CM | POA: Diagnosis not present

## 2015-10-25 DIAGNOSIS — M1711 Unilateral primary osteoarthritis, right knee: Secondary | ICD-10-CM | POA: Diagnosis not present

## 2015-10-26 DIAGNOSIS — M1711 Unilateral primary osteoarthritis, right knee: Secondary | ICD-10-CM | POA: Diagnosis not present

## 2015-10-26 DIAGNOSIS — M25561 Pain in right knee: Secondary | ICD-10-CM | POA: Diagnosis not present

## 2015-10-29 DIAGNOSIS — M19011 Primary osteoarthritis, right shoulder: Secondary | ICD-10-CM | POA: Diagnosis not present

## 2015-10-29 DIAGNOSIS — M4800 Spinal stenosis, site unspecified: Secondary | ICD-10-CM | POA: Diagnosis not present

## 2015-10-29 DIAGNOSIS — I872 Venous insufficiency (chronic) (peripheral): Secondary | ICD-10-CM | POA: Diagnosis not present

## 2015-10-29 DIAGNOSIS — M19012 Primary osteoarthritis, left shoulder: Secondary | ICD-10-CM | POA: Diagnosis not present

## 2015-10-29 DIAGNOSIS — I10 Essential (primary) hypertension: Secondary | ICD-10-CM | POA: Diagnosis not present

## 2015-10-29 DIAGNOSIS — M1711 Unilateral primary osteoarthritis, right knee: Secondary | ICD-10-CM | POA: Diagnosis not present

## 2015-10-31 DIAGNOSIS — I10 Essential (primary) hypertension: Secondary | ICD-10-CM | POA: Diagnosis not present

## 2015-10-31 DIAGNOSIS — M19012 Primary osteoarthritis, left shoulder: Secondary | ICD-10-CM | POA: Diagnosis not present

## 2015-10-31 DIAGNOSIS — I872 Venous insufficiency (chronic) (peripheral): Secondary | ICD-10-CM | POA: Diagnosis not present

## 2015-10-31 DIAGNOSIS — M19011 Primary osteoarthritis, right shoulder: Secondary | ICD-10-CM | POA: Diagnosis not present

## 2015-10-31 DIAGNOSIS — M1711 Unilateral primary osteoarthritis, right knee: Secondary | ICD-10-CM | POA: Diagnosis not present

## 2015-10-31 DIAGNOSIS — M4800 Spinal stenosis, site unspecified: Secondary | ICD-10-CM | POA: Diagnosis not present

## 2015-11-08 DIAGNOSIS — I10 Essential (primary) hypertension: Secondary | ICD-10-CM | POA: Diagnosis not present

## 2015-11-08 DIAGNOSIS — I872 Venous insufficiency (chronic) (peripheral): Secondary | ICD-10-CM | POA: Diagnosis not present

## 2015-11-08 DIAGNOSIS — M4800 Spinal stenosis, site unspecified: Secondary | ICD-10-CM | POA: Diagnosis not present

## 2015-11-08 DIAGNOSIS — M1711 Unilateral primary osteoarthritis, right knee: Secondary | ICD-10-CM | POA: Diagnosis not present

## 2015-11-08 DIAGNOSIS — M19012 Primary osteoarthritis, left shoulder: Secondary | ICD-10-CM | POA: Diagnosis not present

## 2015-11-08 DIAGNOSIS — M19011 Primary osteoarthritis, right shoulder: Secondary | ICD-10-CM | POA: Diagnosis not present

## 2015-11-14 DIAGNOSIS — M19011 Primary osteoarthritis, right shoulder: Secondary | ICD-10-CM | POA: Diagnosis not present

## 2015-11-14 DIAGNOSIS — M1711 Unilateral primary osteoarthritis, right knee: Secondary | ICD-10-CM | POA: Diagnosis not present

## 2015-11-14 DIAGNOSIS — M19012 Primary osteoarthritis, left shoulder: Secondary | ICD-10-CM | POA: Diagnosis not present

## 2015-11-14 DIAGNOSIS — M4800 Spinal stenosis, site unspecified: Secondary | ICD-10-CM | POA: Diagnosis not present

## 2015-11-14 DIAGNOSIS — I10 Essential (primary) hypertension: Secondary | ICD-10-CM | POA: Diagnosis not present

## 2015-11-14 DIAGNOSIS — I872 Venous insufficiency (chronic) (peripheral): Secondary | ICD-10-CM | POA: Diagnosis not present

## 2015-11-16 DIAGNOSIS — M4800 Spinal stenosis, site unspecified: Secondary | ICD-10-CM | POA: Diagnosis not present

## 2015-11-16 DIAGNOSIS — I10 Essential (primary) hypertension: Secondary | ICD-10-CM | POA: Diagnosis not present

## 2015-11-16 DIAGNOSIS — M1711 Unilateral primary osteoarthritis, right knee: Secondary | ICD-10-CM | POA: Diagnosis not present

## 2015-11-16 DIAGNOSIS — M19012 Primary osteoarthritis, left shoulder: Secondary | ICD-10-CM | POA: Diagnosis not present

## 2015-11-16 DIAGNOSIS — I872 Venous insufficiency (chronic) (peripheral): Secondary | ICD-10-CM | POA: Diagnosis not present

## 2015-11-16 DIAGNOSIS — M19011 Primary osteoarthritis, right shoulder: Secondary | ICD-10-CM | POA: Diagnosis not present

## 2015-11-20 DIAGNOSIS — M19012 Primary osteoarthritis, left shoulder: Secondary | ICD-10-CM | POA: Diagnosis not present

## 2015-11-20 DIAGNOSIS — M1711 Unilateral primary osteoarthritis, right knee: Secondary | ICD-10-CM | POA: Diagnosis not present

## 2015-11-20 DIAGNOSIS — I10 Essential (primary) hypertension: Secondary | ICD-10-CM | POA: Diagnosis not present

## 2015-11-20 DIAGNOSIS — M4800 Spinal stenosis, site unspecified: Secondary | ICD-10-CM | POA: Diagnosis not present

## 2015-11-20 DIAGNOSIS — M19011 Primary osteoarthritis, right shoulder: Secondary | ICD-10-CM | POA: Diagnosis not present

## 2015-11-20 DIAGNOSIS — I872 Venous insufficiency (chronic) (peripheral): Secondary | ICD-10-CM | POA: Diagnosis not present

## 2015-11-22 DIAGNOSIS — M19012 Primary osteoarthritis, left shoulder: Secondary | ICD-10-CM | POA: Diagnosis not present

## 2015-11-22 DIAGNOSIS — M1711 Unilateral primary osteoarthritis, right knee: Secondary | ICD-10-CM | POA: Diagnosis not present

## 2015-11-22 DIAGNOSIS — I872 Venous insufficiency (chronic) (peripheral): Secondary | ICD-10-CM | POA: Diagnosis not present

## 2015-11-22 DIAGNOSIS — M19011 Primary osteoarthritis, right shoulder: Secondary | ICD-10-CM | POA: Diagnosis not present

## 2015-11-22 DIAGNOSIS — M4800 Spinal stenosis, site unspecified: Secondary | ICD-10-CM | POA: Diagnosis not present

## 2015-11-22 DIAGNOSIS — I10 Essential (primary) hypertension: Secondary | ICD-10-CM | POA: Diagnosis not present

## 2015-11-28 DIAGNOSIS — M4800 Spinal stenosis, site unspecified: Secondary | ICD-10-CM | POA: Diagnosis not present

## 2015-11-28 DIAGNOSIS — M19012 Primary osteoarthritis, left shoulder: Secondary | ICD-10-CM | POA: Diagnosis not present

## 2015-11-28 DIAGNOSIS — M1711 Unilateral primary osteoarthritis, right knee: Secondary | ICD-10-CM | POA: Diagnosis not present

## 2015-11-28 DIAGNOSIS — I10 Essential (primary) hypertension: Secondary | ICD-10-CM | POA: Diagnosis not present

## 2015-11-28 DIAGNOSIS — M19011 Primary osteoarthritis, right shoulder: Secondary | ICD-10-CM | POA: Diagnosis not present

## 2015-11-28 DIAGNOSIS — I872 Venous insufficiency (chronic) (peripheral): Secondary | ICD-10-CM | POA: Diagnosis not present

## 2015-11-30 DIAGNOSIS — M1711 Unilateral primary osteoarthritis, right knee: Secondary | ICD-10-CM | POA: Diagnosis not present

## 2015-11-30 DIAGNOSIS — I872 Venous insufficiency (chronic) (peripheral): Secondary | ICD-10-CM | POA: Diagnosis not present

## 2015-11-30 DIAGNOSIS — M19012 Primary osteoarthritis, left shoulder: Secondary | ICD-10-CM | POA: Diagnosis not present

## 2015-11-30 DIAGNOSIS — M19011 Primary osteoarthritis, right shoulder: Secondary | ICD-10-CM | POA: Diagnosis not present

## 2015-11-30 DIAGNOSIS — I10 Essential (primary) hypertension: Secondary | ICD-10-CM | POA: Diagnosis not present

## 2015-11-30 DIAGNOSIS — M4800 Spinal stenosis, site unspecified: Secondary | ICD-10-CM | POA: Diagnosis not present

## 2015-12-03 DIAGNOSIS — M19012 Primary osteoarthritis, left shoulder: Secondary | ICD-10-CM | POA: Diagnosis not present

## 2015-12-03 DIAGNOSIS — I10 Essential (primary) hypertension: Secondary | ICD-10-CM | POA: Diagnosis not present

## 2015-12-03 DIAGNOSIS — M1711 Unilateral primary osteoarthritis, right knee: Secondary | ICD-10-CM | POA: Diagnosis not present

## 2015-12-03 DIAGNOSIS — M4800 Spinal stenosis, site unspecified: Secondary | ICD-10-CM | POA: Diagnosis not present

## 2015-12-03 DIAGNOSIS — M19011 Primary osteoarthritis, right shoulder: Secondary | ICD-10-CM | POA: Diagnosis not present

## 2015-12-03 DIAGNOSIS — I872 Venous insufficiency (chronic) (peripheral): Secondary | ICD-10-CM | POA: Diagnosis not present

## 2015-12-17 DIAGNOSIS — I890 Lymphedema, not elsewhere classified: Secondary | ICD-10-CM | POA: Diagnosis not present

## 2015-12-17 DIAGNOSIS — I872 Venous insufficiency (chronic) (peripheral): Secondary | ICD-10-CM | POA: Diagnosis not present

## 2015-12-17 DIAGNOSIS — M7989 Other specified soft tissue disorders: Secondary | ICD-10-CM | POA: Diagnosis not present

## 2015-12-17 DIAGNOSIS — M79609 Pain in unspecified limb: Secondary | ICD-10-CM | POA: Diagnosis not present

## 2015-12-21 DIAGNOSIS — M25511 Pain in right shoulder: Secondary | ICD-10-CM | POA: Diagnosis not present

## 2015-12-21 DIAGNOSIS — G8929 Other chronic pain: Secondary | ICD-10-CM | POA: Diagnosis not present

## 2015-12-21 DIAGNOSIS — M19012 Primary osteoarthritis, left shoulder: Secondary | ICD-10-CM | POA: Diagnosis not present

## 2015-12-21 DIAGNOSIS — M19011 Primary osteoarthritis, right shoulder: Secondary | ICD-10-CM | POA: Diagnosis not present

## 2015-12-21 DIAGNOSIS — M25512 Pain in left shoulder: Secondary | ICD-10-CM | POA: Diagnosis not present

## 2015-12-24 ENCOUNTER — Telehealth: Payer: Self-pay | Admitting: Family Medicine

## 2015-12-24 NOTE — Telephone Encounter (Signed)
Jenny Reichmann from [facility] called stated pt wants to start physical therapy, they wanted to know if Dr. Jeananne Rama will approve this. Please call Cindy @ [facility]. Thanks.

## 2015-12-24 NOTE — Telephone Encounter (Signed)
Spoke with Jenny Reichmann, we have not seen patient, this recommendation came from Dr. Delana Meyer, she is going to call that office for order

## 2016-02-01 ENCOUNTER — Telehealth: Payer: Self-pay | Admitting: Family Medicine

## 2016-02-01 NOTE — Telephone Encounter (Signed)
Pt needs refill on clopidogrel (PLAVIX) 75 MG tablet sent to [facility] for 10 days until her mail order comes in.

## 2016-02-03 MED ORDER — CLOPIDOGREL BISULFATE 75 MG PO TABS: 75.0000 mg | ORAL_TABLET | Freq: Every day | ORAL | 3 refills | Status: DC

## 2016-02-06 DIAGNOSIS — M25512 Pain in left shoulder: Secondary | ICD-10-CM | POA: Diagnosis not present

## 2016-02-06 DIAGNOSIS — M25561 Pain in right knee: Secondary | ICD-10-CM | POA: Diagnosis not present

## 2016-02-08 DIAGNOSIS — M12812 Other specific arthropathies, not elsewhere classified, left shoulder: Secondary | ICD-10-CM | POA: Diagnosis not present

## 2016-02-08 DIAGNOSIS — M1711 Unilateral primary osteoarthritis, right knee: Secondary | ICD-10-CM | POA: Diagnosis not present

## 2016-02-29 ENCOUNTER — Encounter: Payer: Self-pay | Admitting: Family Medicine

## 2016-02-29 ENCOUNTER — Other Ambulatory Visit: Payer: Self-pay

## 2016-02-29 ENCOUNTER — Ambulatory Visit (INDEPENDENT_AMBULATORY_CARE_PROVIDER_SITE_OTHER): Payer: Medicare Other | Admitting: Family Medicine

## 2016-02-29 VITALS — BP 85/63 | HR 99 | Temp 97.9°F

## 2016-02-29 DIAGNOSIS — J069 Acute upper respiratory infection, unspecified: Secondary | ICD-10-CM

## 2016-02-29 DIAGNOSIS — I10 Essential (primary) hypertension: Secondary | ICD-10-CM

## 2016-02-29 MED ORDER — ALBUTEROL SULFATE HFA 108 (90 BASE) MCG/ACT IN AERS: 2.0000 | INHALATION_SPRAY | Freq: Four times a day (QID) | RESPIRATORY_TRACT | 0 refills | Status: DC | PRN

## 2016-02-29 MED ORDER — BENZONATATE 100 MG PO CAPS: 100.0000 mg | ORAL_CAPSULE | Freq: Two times a day (BID) | ORAL | 0 refills | Status: DC | PRN

## 2016-02-29 MED ORDER — AZITHROMYCIN 250 MG PO TABS: ORAL_TABLET | ORAL | 0 refills | Status: DC

## 2016-02-29 NOTE — Assessment & Plan Note (Signed)
D/c ramipril unless reading over 140/90. Follow up in 2 weeks for recheck.

## 2016-02-29 NOTE — Patient Instructions (Signed)
Follow up in 2 weeks to check BP

## 2016-02-29 NOTE — Progress Notes (Signed)
BP (!) 85/63   Pulse 99   Temp 97.9 F (36.6 C)   SpO2 96%    Subjective:    Patient ID: Jennifer Murray, female    DOB: Oct 01, 1927, 82 y.o.   MRN: [id number]  HPI: Jennifer Murray is a 82 y.o. female  Chief Complaint  Patient presents with  . URI    over a week. Productive cough with yellow mucus, head and chest congestion, runny nose, maybe some fever/chills. No sore throat or ear ache. Tried Mucinex  . Hypotension    she is taking the Ramipril every other day, wonders if she needs to stop it.   Patient presents with over a week nasal congestion, productive cough, some SOB with exertion, and wheezing. Denies ST, ear pain, fever, chills. Taking mucinex DM with some relief. Grandson who lives with her also sick with same symptoms.   Also having issues with BP regulation. The past year or so, she has been having low BP readings and was told to take ramipril prn to keep it from dipping too low. Taking ramipril every other day the past few weeks, previously was taking every day still. Does have some dizziness with standing and such, but no falls or syncopal episodes noted. Does have a monitor at home that she keeps track of her readings with.    Relevant past medical, surgical, family and social history reviewed and updated as indicated. Interim medical history since our last visit reviewed. Allergies and medications reviewed and updated.  Review of Systems  Constitutional: Negative.   HENT: Positive for congestion and rhinorrhea.   Eyes: Negative.   Respiratory: Positive for cough, shortness of breath and wheezing.   Cardiovascular: Negative.   Gastrointestinal: Negative.   Genitourinary: Negative.   Musculoskeletal: Negative.   Skin: Negative.   Neurological: Positive for light-headedness.  Psychiatric/Behavioral: Negative.     Per HPI unless specifically indicated above     Objective:    BP (!) 85/63   Pulse 99   Temp 97.9 F (36.6 C)   SpO2 96%   Wt Readings from Last  3 Encounters:  10/01/15 240 lb (108.9 kg)  04/25/15 231 lb (104.8 kg)  04/21/15 238 lb (108 kg)    Physical Exam  Constitutional: She is oriented to person, place, and time. She appears well-developed and well-nourished. No distress.  HENT:  Head: Atraumatic.  Right Ear: External ear normal.  Left Ear: External ear normal.  Nose: Nose normal.  Mouth/Throat: Oropharynx is clear and moist. No oropharyngeal exudate.  Eyes: Conjunctivae are normal. Pupils are equal, round, and reactive to light. No scleral icterus.  Neck: Normal range of motion. Neck supple.  Cardiovascular: Normal rate, regular rhythm and normal heart sounds.   Pulmonary/Chest: Effort normal. No respiratory distress. She has wheezes (mild intermittent wheezes b/l). She has no rales.  Musculoskeletal: Normal range of motion.  Lymphadenopathy:    She has no cervical adenopathy.  Neurological: She is alert and oriented to person, place, and time.  Skin: Skin is warm and dry.  Psychiatric: She has a normal mood and affect. Her behavior is normal.  Nursing note and vitals reviewed.     Assessment & Plan:   Problem List Items Addressed This Visit      Cardiovascular and Mediastinum   Essential hypertension    D/c ramipril unless reading over 140/90. Follow up in 2 weeks for recheck.        Other Visit Diagnoses    Upper respiratory  tract infection, unspecified type    -  Primary   Azithromycin sent, as well as tessalon perles and albuterol inhaler prn. Can continue delsym and mucinex if desired. F/u if no better by Monday   Relevant Medications   azithromycin (ZITHROMAX) 250 MG tablet       Follow up plan: Return in about 2 weeks (around 03/14/2016) for BP check.

## 2016-03-03 ENCOUNTER — Telehealth (INDEPENDENT_AMBULATORY_CARE_PROVIDER_SITE_OTHER): Payer: Self-pay

## 2016-03-03 NOTE — Telephone Encounter (Signed)
Patient's daughter wants last visit notes so she can explain to patient that she needs to keep doing what the doctor wants her to do.

## 2016-03-14 ENCOUNTER — Encounter: Payer: Self-pay | Admitting: Family Medicine

## 2016-03-14 ENCOUNTER — Ambulatory Visit (INDEPENDENT_AMBULATORY_CARE_PROVIDER_SITE_OTHER): Payer: Medicare Other | Admitting: Family Medicine

## 2016-03-14 VITALS — BP 92/64 | HR 83 | Temp 98.4°F

## 2016-03-14 DIAGNOSIS — I10 Essential (primary) hypertension: Secondary | ICD-10-CM | POA: Diagnosis not present

## 2016-03-14 DIAGNOSIS — R600 Localized edema: Secondary | ICD-10-CM | POA: Diagnosis not present

## 2016-03-14 DIAGNOSIS — Z23 Encounter for immunization: Secondary | ICD-10-CM | POA: Diagnosis not present

## 2016-03-14 MED ORDER — CETIRIZINE HCL 10 MG PO TABS: 10.0000 mg | ORAL_TABLET | Freq: Every day | ORAL | 11 refills | Status: DC

## 2016-03-14 NOTE — Assessment & Plan Note (Signed)
Continue d/c of ramipril. Stay well hydrated.

## 2016-03-14 NOTE — Assessment & Plan Note (Signed)
Script written for a new chair

## 2016-03-14 NOTE — Progress Notes (Signed)
   BP 92/64 (BP Location: Left Arm)   Pulse 83   Temp 98.4 F (36.9 C)   SpO2 98%    Subjective:    Patient ID: Jennifer Murray, female    DOB: 04/20/1928, 82 y.o.   MRN: [id number]  HPI: Jennifer Murray is a 82 y.o. female  Chief Complaint  Patient presents with  . Hypotensive    follow up/recheck. She has not taken any BP meds in the last 2 weeks.  . Immunizations    she wants to make sure she's no longer sick so she can get her flu shot   Patient presents for BP recheck following d/c of ramipril due to hypotension. Doing well, has not noticed much change since stopping the medicine. Denies dizziness, lightheadedness, or syncope. Feels fatigued, but that is not unusual.   Feeling much better since her URI 2 weeks ago. Having clear nasal drainage but that has been going on for over a month. Not currently taking anything for it.   Needs script for trendelenburg recliner d/t her lymphedema.   Relevant past medical, surgical, family and social history reviewed and updated as indicated. Interim medical history since our last visit reviewed. Allergies and medications reviewed and updated.  Review of Systems  Constitutional: Positive for fatigue.  HENT: Positive for rhinorrhea.   Respiratory: Negative.   Cardiovascular: Positive for leg swelling.  Gastrointestinal: Negative.   Genitourinary: Negative.   Musculoskeletal: Negative.   Skin: Negative.   Neurological: Negative.   Psychiatric/Behavioral: Negative.     Per HPI unless specifically indicated above     Objective:    BP 92/64 (BP Location: Left Arm)   Pulse 83   Temp 98.4 F (36.9 C)   SpO2 98%   Wt Readings from Last 3 Encounters:  10/01/15 240 lb (108.9 kg)  04/25/15 231 lb (104.8 kg)  04/21/15 238 lb (108 kg)    Physical Exam  Constitutional: She is oriented to person, place, and time. She appears well-developed and well-nourished.  HENT:  Head: Atraumatic.  Right Ear: External ear normal.  Left Ear:  External ear normal.  Nose: Nose normal.  Mouth/Throat: Oropharynx is clear and moist.  Eyes: Conjunctivae are normal. No scleral icterus.  Neck: Normal range of motion. Neck supple.  Cardiovascular: Normal rate and normal heart sounds.   Pulmonary/Chest: Effort normal and breath sounds normal. No respiratory distress. She has no wheezes. She has no rales.  Musculoskeletal: Normal range of motion.  B/l LE edema, chronic  Lymphadenopathy:    She has no cervical adenopathy.  Neurological: She is alert and oriented to person, place, and time.  Skin: Skin is warm and dry.  Psychiatric: She has a normal mood and affect. Her behavior is normal.  Nursing note and vitals reviewed.     Assessment & Plan:   Problem List Items Addressed This Visit      Cardiovascular and Mediastinum   Essential hypertension - Primary    Continue d/c of ramipril. Stay well hydrated.         Other   Leg edema    Script written for a new chair       Other Visit Diagnoses   None.     Follow up plan: Return for as scheduled. Regular f/u scheduled for early Nov

## 2016-04-02 ENCOUNTER — Encounter: Payer: Self-pay | Admitting: Family Medicine

## 2016-04-02 ENCOUNTER — Ambulatory Visit (INDEPENDENT_AMBULATORY_CARE_PROVIDER_SITE_OTHER): Payer: Medicare Other | Admitting: Family Medicine

## 2016-04-02 VITALS — BP 105/73 | HR 86 | Temp 98.1°F

## 2016-04-02 DIAGNOSIS — M48061 Spinal stenosis, lumbar region without neurogenic claudication: Secondary | ICD-10-CM | POA: Diagnosis not present

## 2016-04-02 DIAGNOSIS — B372 Candidiasis of skin and nail: Secondary | ICD-10-CM

## 2016-04-02 DIAGNOSIS — I10 Essential (primary) hypertension: Secondary | ICD-10-CM | POA: Diagnosis not present

## 2016-04-02 DIAGNOSIS — Z7189 Other specified counseling: Secondary | ICD-10-CM | POA: Diagnosis not present

## 2016-04-02 MED ORDER — CLOTRIMAZOLE-BETAMETHASONE 1-0.05 % EX CREA: 1.0000 "application " | TOPICAL_CREAM | Freq: Two times a day (BID) | CUTANEOUS | 0 refills | Status: DC

## 2016-04-02 NOTE — Assessment & Plan Note (Signed)
Off medications and blood pressure doing well

## 2016-04-02 NOTE — Assessment & Plan Note (Signed)
Discuss use of medications and prevention and care

## 2016-04-02 NOTE — Assessment & Plan Note (Signed)
In wheelchair most of the time but stable.

## 2016-04-02 NOTE — Progress Notes (Signed)
BP 105/73   Pulse 86   Temp 98.1 F (36.7 C)   SpO2 97%    Subjective:    Patient ID: Jennifer Murray, female    DOB: 1927/09/01, 82 y.o.   MRN: [id number]  HPI: Jennifer Murray is a 82 y.o. female  Chief Complaint  Patient presents with  . Hypertension    patient completely stopped the Ramipril.   Patient's blood pressures been running low even off ramipril. Has been feeling fine otherwise. Accompanied by grandchildren who helps with history. Patient concerned about living will and advanced directives which were reviewed and discussed with patient gave forms to fill out and directions where to find more. Patient also with some intertrigo concerned uses a and D ointment which helps some.  Relevant past medical, surgical, family and social history reviewed and updated as indicated. Interim medical history since our last visit reviewed. Allergies and medications reviewed and updated.  Review of Systems  Constitutional: Negative.   Respiratory: Negative.   Cardiovascular: Negative.     Per HPI unless specifically indicated above     Objective:    BP 105/73   Pulse 86   Temp 98.1 F (36.7 C)   SpO2 97%   Wt Readings from Last 3 Encounters:  10/01/15 240 lb (108.9 kg)  04/25/15 231 lb (104.8 kg)  04/21/15 238 lb (108 kg)    Physical Exam  Constitutional: She is oriented to person, place, and time. She appears well-developed and well-nourished. No distress.  HENT:  Head: Normocephalic and atraumatic.  Right Ear: Hearing normal.  Left Ear: Hearing normal.  Nose: Nose normal.  Eyes: Conjunctivae and lids are normal. Right eye exhibits no discharge. Left eye exhibits no discharge. No scleral icterus.  Cardiovascular: Normal rate, regular rhythm and normal heart sounds.   Pulmonary/Chest: Effort normal and breath sounds normal. No respiratory distress.  Musculoskeletal: Normal range of motion.  Neurological: She is alert and oriented to person, place, and time.  Skin:  Skin is intact. No rash noted.  Psychiatric: She has a normal mood and affect. Her speech is normal and behavior is normal. Judgment and thought content normal. Cognition and memory are normal.    Results for orders placed or performed in visit on 10/01/15  CBC with Differential/Platelet  Result Value Ref Range   WBC 9.6 3.4 - 10.8 x10E3/uL   RBC 4.90 3.77 - 5.28 x10E6/uL   Hemoglobin 14.9 11.1 - 15.9 g/dL   Hematocrit 44.6 34.0 - 46.6 %   MCV 91 79 - 97 fL   MCH 30.4 26.6 - 33.0 pg   MCHC 33.4 31.5 - 35.7 g/dL   RDW 13.6 12.3 - 15.4 %   Platelets 222 150 - 379 x10E3/uL   Neutrophils 58 %   Lymphs 29 %   Monocytes 11 %   Eos 2 %   Basos 0 %   Neutrophils Absolute 5.6 1.4 - 7.0 x10E3/uL   Lymphocytes Absolute 2.8 0.7 - 3.1 x10E3/uL   Monocytes Absolute 1.1 (H) 0.1 - 0.9 x10E3/uL   EOS (ABSOLUTE) 0.2 0.0 - 0.4 x10E3/uL   Basophils Absolute 0.0 0.0 - 0.2 x10E3/uL   Immature Granulocytes 0 %   Immature Grans (Abs) 0.0 0.0 - 0.1 x10E3/uL  Comprehensive metabolic panel  Result Value Ref Range   Glucose 96 65 - 99 mg/dL   BUN 21 8 - 27 mg/dL   Creatinine, Ser 0.69 0.57 - 1.00 mg/dL   GFR calc non Af Amer 79 >59  mL/min/1.73   GFR calc Af Amer 91 >59 mL/min/1.73   BUN/Creatinine Ratio 30 (H) 12 - 28   Sodium 140 134 - 144 mmol/L   Potassium 4.3 3.5 - 5.2 mmol/L   Chloride 99 96 - 106 mmol/L   CO2 25 18 - 29 mmol/L   Calcium 11.0 (H) 8.7 - 10.3 mg/dL   Total Protein 7.1 6.0 - 8.5 g/dL   Albumin 4.3 3.5 - 4.7 g/dL   Globulin, Total 2.8 1.5 - 4.5 g/dL   Albumin/Globulin Ratio 1.5 1.2 - 2.2   Bilirubin Total 0.5 0.0 - 1.2 mg/dL   Alkaline Phosphatase 108 39 - 117 IU/L   AST 23 0 - 40 IU/L   ALT 18 0 - 32 IU/L  Lipid Panel w/o Chol/HDL Ratio  Result Value Ref Range   Cholesterol, Total 190 100 - 199 mg/dL   Triglycerides 139 0 - 149 mg/dL   HDL 50 >39 mg/dL   VLDL Cholesterol Cal 28 5 - 40 mg/dL   LDL Calculated 112 (H) 0 - 99 mg/dL  TSH  Result Value Ref Range   TSH 1.980  0.450 - 4.500 uIU/mL  Urinalysis, Routine w reflex microscopic (not at [facility])  Result Value Ref Range   Specific Gravity, UA 1.015 1.005 - 1.030   pH, UA 5.5 5.0 - 7.5   Color, UA Yellow Yellow   Appearance Ur Cloudy (A) Clear   Leukocytes, UA Negative Negative   Protein, UA Negative Negative/Trace   Glucose, UA Negative Negative   Ketones, UA Negative Negative   RBC, UA Negative Negative   Bilirubin, UA Negative Negative   Urobilinogen, Ur 0.2 0.2 - 1.0 mg/dL   Nitrite, UA Negative Negative      Assessment & Plan:   Problem List Items Addressed This Visit      Cardiovascular and Mediastinum   Essential hypertension - Primary    Off medications and blood pressure doing well      Relevant Orders   Basic metabolic panel     Musculoskeletal and Integument   Candidal intertrigo    Discuss use of medications and prevention and care      Relevant Medications   clotrimazole-betamethasone (LOTRISONE) cream     Other   Lumbar spinal stenosis    In wheelchair most of the time but stable.      Advanced directives, counseling/discussion    Extensively discuss advanced directives planning and care.          Follow up plan: No Follow-up on file.

## 2016-04-02 NOTE — Assessment & Plan Note (Signed)
Extensively discuss advanced directives planning and care.

## 2016-04-03 ENCOUNTER — Encounter: Payer: Self-pay | Admitting: Family Medicine

## 2016-04-03 LAB — BASIC METABOLIC PANEL
BUN / CREAT RATIO: 25 (ref 12–28)
BUN: 17 mg/dL (ref 8–27)
CHLORIDE: 103 mmol/L (ref 96–106)
CO2: 25 mmol/L (ref 18–29)
Calcium: 9.9 mg/dL (ref 8.7–10.3)
Creatinine, Ser: 0.68 mg/dL (ref 0.57–1.00)
GFR calc Af Amer: 92 mL/min/{1.73_m2} (ref >59)
GFR calc non Af Amer: 79 mL/min/{1.73_m2} (ref >59)
GLUCOSE: 147 mg/dL — AB (ref 65–99)
Potassium: 3.9 mmol/L (ref 3.5–5.2)
SODIUM: 143 mmol/L (ref 134–144)

## 2016-05-02 DIAGNOSIS — G8929 Other chronic pain: Secondary | ICD-10-CM | POA: Diagnosis not present

## 2016-05-02 DIAGNOSIS — M19011 Primary osteoarthritis, right shoulder: Secondary | ICD-10-CM | POA: Diagnosis not present

## 2016-05-02 DIAGNOSIS — M19012 Primary osteoarthritis, left shoulder: Secondary | ICD-10-CM | POA: Diagnosis not present

## 2016-05-02 DIAGNOSIS — M25512 Pain in left shoulder: Secondary | ICD-10-CM | POA: Diagnosis not present

## 2016-05-02 DIAGNOSIS — M25511 Pain in right shoulder: Secondary | ICD-10-CM | POA: Diagnosis not present

## 2016-05-15 ENCOUNTER — Telehealth: Payer: Self-pay | Admitting: Family Medicine

## 2016-05-15 NOTE — Telephone Encounter (Signed)
Please set up patient for home care evaluation.

## 2016-05-15 NOTE — Telephone Encounter (Signed)
Phone call Discussed with patient's daughter patient's declining health and need for increased care. Reviewed options patient's already explored both church and [facility]. We will set up for home health evaluation. Discussed options and care.

## 2016-05-15 NOTE — Telephone Encounter (Signed)
Routing to provider  

## 2016-05-15 NOTE — Telephone Encounter (Signed)
Call pt 

## 2016-05-15 NOTE — Telephone Encounter (Signed)
Pt's daughter called stated she needs to speak with Dr. Jeananne Rama or his nurse. The pt has increased mobility issue, she could not walk for a bit. They are concerned and would like to have in home care for the patient and understands this has to be arranged by the pt's PCP. They are concerned the pt may fall and not have anyone around to assist. Please follow up with Lourae ASAP. Thanks.

## 2016-05-16 NOTE — Telephone Encounter (Signed)
Referral signed and sent [facility].

## 2016-05-22 ENCOUNTER — Telehealth: Payer: Self-pay

## 2016-05-22 NOTE — Telephone Encounter (Signed)
  FYI [facility] called to let us know that the patient declined to be seen today, but told them to check back next week.

## 2016-05-28 DIAGNOSIS — M48061 Spinal stenosis, lumbar region without neurogenic claudication: Secondary | ICD-10-CM | POA: Diagnosis not present

## 2016-05-28 DIAGNOSIS — R531 Weakness: Secondary | ICD-10-CM | POA: Diagnosis not present

## 2016-05-28 DIAGNOSIS — R262 Difficulty in walking, not elsewhere classified: Secondary | ICD-10-CM | POA: Diagnosis not present

## 2016-05-28 DIAGNOSIS — I10 Essential (primary) hypertension: Secondary | ICD-10-CM | POA: Diagnosis not present

## 2016-05-31 DIAGNOSIS — R531 Weakness: Secondary | ICD-10-CM | POA: Diagnosis not present

## 2016-05-31 DIAGNOSIS — M48061 Spinal stenosis, lumbar region without neurogenic claudication: Secondary | ICD-10-CM | POA: Diagnosis not present

## 2016-05-31 DIAGNOSIS — I10 Essential (primary) hypertension: Secondary | ICD-10-CM | POA: Diagnosis not present

## 2016-05-31 DIAGNOSIS — R262 Difficulty in walking, not elsewhere classified: Secondary | ICD-10-CM | POA: Diagnosis not present

## 2016-06-02 DIAGNOSIS — R531 Weakness: Secondary | ICD-10-CM | POA: Diagnosis not present

## 2016-06-02 DIAGNOSIS — M48061 Spinal stenosis, lumbar region without neurogenic claudication: Secondary | ICD-10-CM | POA: Diagnosis not present

## 2016-06-02 DIAGNOSIS — R262 Difficulty in walking, not elsewhere classified: Secondary | ICD-10-CM | POA: Diagnosis not present

## 2016-06-02 DIAGNOSIS — I10 Essential (primary) hypertension: Secondary | ICD-10-CM | POA: Diagnosis not present

## 2016-06-04 DIAGNOSIS — M48061 Spinal stenosis, lumbar region without neurogenic claudication: Secondary | ICD-10-CM | POA: Diagnosis not present

## 2016-06-04 DIAGNOSIS — R531 Weakness: Secondary | ICD-10-CM | POA: Diagnosis not present

## 2016-06-04 DIAGNOSIS — R262 Difficulty in walking, not elsewhere classified: Secondary | ICD-10-CM | POA: Diagnosis not present

## 2016-06-04 DIAGNOSIS — I10 Essential (primary) hypertension: Secondary | ICD-10-CM | POA: Diagnosis not present

## 2016-06-06 DIAGNOSIS — R262 Difficulty in walking, not elsewhere classified: Secondary | ICD-10-CM | POA: Diagnosis not present

## 2016-06-06 DIAGNOSIS — R531 Weakness: Secondary | ICD-10-CM | POA: Diagnosis not present

## 2016-06-06 DIAGNOSIS — M48061 Spinal stenosis, lumbar region without neurogenic claudication: Secondary | ICD-10-CM | POA: Diagnosis not present

## 2016-06-06 DIAGNOSIS — I10 Essential (primary) hypertension: Secondary | ICD-10-CM | POA: Diagnosis not present

## 2016-06-09 DIAGNOSIS — M48061 Spinal stenosis, lumbar region without neurogenic claudication: Secondary | ICD-10-CM | POA: Diagnosis not present

## 2016-06-09 DIAGNOSIS — I10 Essential (primary) hypertension: Secondary | ICD-10-CM | POA: Diagnosis not present

## 2016-06-09 DIAGNOSIS — R262 Difficulty in walking, not elsewhere classified: Secondary | ICD-10-CM | POA: Diagnosis not present

## 2016-06-09 DIAGNOSIS — R531 Weakness: Secondary | ICD-10-CM | POA: Diagnosis not present

## 2016-06-10 DIAGNOSIS — I10 Essential (primary) hypertension: Secondary | ICD-10-CM | POA: Diagnosis not present

## 2016-06-10 DIAGNOSIS — R531 Weakness: Secondary | ICD-10-CM | POA: Diagnosis not present

## 2016-06-10 DIAGNOSIS — R262 Difficulty in walking, not elsewhere classified: Secondary | ICD-10-CM | POA: Diagnosis not present

## 2016-06-10 DIAGNOSIS — M48061 Spinal stenosis, lumbar region without neurogenic claudication: Secondary | ICD-10-CM | POA: Diagnosis not present

## 2016-06-14 DIAGNOSIS — R531 Weakness: Secondary | ICD-10-CM | POA: Diagnosis not present

## 2016-06-14 DIAGNOSIS — I10 Essential (primary) hypertension: Secondary | ICD-10-CM | POA: Diagnosis not present

## 2016-06-14 DIAGNOSIS — R262 Difficulty in walking, not elsewhere classified: Secondary | ICD-10-CM | POA: Diagnosis not present

## 2016-06-14 DIAGNOSIS — M48061 Spinal stenosis, lumbar region without neurogenic claudication: Secondary | ICD-10-CM | POA: Diagnosis not present

## 2016-06-16 DIAGNOSIS — M48061 Spinal stenosis, lumbar region without neurogenic claudication: Secondary | ICD-10-CM | POA: Diagnosis not present

## 2016-06-16 DIAGNOSIS — R531 Weakness: Secondary | ICD-10-CM | POA: Diagnosis not present

## 2016-06-16 DIAGNOSIS — I10 Essential (primary) hypertension: Secondary | ICD-10-CM | POA: Diagnosis not present

## 2016-06-16 DIAGNOSIS — R262 Difficulty in walking, not elsewhere classified: Secondary | ICD-10-CM | POA: Diagnosis not present

## 2016-06-17 DIAGNOSIS — M48061 Spinal stenosis, lumbar region without neurogenic claudication: Secondary | ICD-10-CM | POA: Diagnosis not present

## 2016-06-17 DIAGNOSIS — R262 Difficulty in walking, not elsewhere classified: Secondary | ICD-10-CM | POA: Diagnosis not present

## 2016-06-17 DIAGNOSIS — I10 Essential (primary) hypertension: Secondary | ICD-10-CM | POA: Diagnosis not present

## 2016-06-17 DIAGNOSIS — R531 Weakness: Secondary | ICD-10-CM | POA: Diagnosis not present

## 2016-06-18 DIAGNOSIS — I10 Essential (primary) hypertension: Secondary | ICD-10-CM | POA: Diagnosis not present

## 2016-06-18 DIAGNOSIS — R262 Difficulty in walking, not elsewhere classified: Secondary | ICD-10-CM | POA: Diagnosis not present

## 2016-06-18 DIAGNOSIS — M48061 Spinal stenosis, lumbar region without neurogenic claudication: Secondary | ICD-10-CM | POA: Diagnosis not present

## 2016-06-18 DIAGNOSIS — R531 Weakness: Secondary | ICD-10-CM | POA: Diagnosis not present

## 2016-06-23 DIAGNOSIS — I10 Essential (primary) hypertension: Secondary | ICD-10-CM | POA: Diagnosis not present

## 2016-06-23 DIAGNOSIS — M48061 Spinal stenosis, lumbar region without neurogenic claudication: Secondary | ICD-10-CM | POA: Diagnosis not present

## 2016-06-23 DIAGNOSIS — R262 Difficulty in walking, not elsewhere classified: Secondary | ICD-10-CM | POA: Diagnosis not present

## 2016-06-23 DIAGNOSIS — R531 Weakness: Secondary | ICD-10-CM | POA: Diagnosis not present

## 2016-06-27 DIAGNOSIS — M48061 Spinal stenosis, lumbar region without neurogenic claudication: Secondary | ICD-10-CM | POA: Diagnosis not present

## 2016-06-27 DIAGNOSIS — R262 Difficulty in walking, not elsewhere classified: Secondary | ICD-10-CM | POA: Diagnosis not present

## 2016-06-27 DIAGNOSIS — R531 Weakness: Secondary | ICD-10-CM | POA: Diagnosis not present

## 2016-06-27 DIAGNOSIS — I10 Essential (primary) hypertension: Secondary | ICD-10-CM | POA: Diagnosis not present

## 2016-06-30 DIAGNOSIS — M48061 Spinal stenosis, lumbar region without neurogenic claudication: Secondary | ICD-10-CM | POA: Diagnosis not present

## 2016-06-30 DIAGNOSIS — I10 Essential (primary) hypertension: Secondary | ICD-10-CM | POA: Diagnosis not present

## 2016-06-30 DIAGNOSIS — R531 Weakness: Secondary | ICD-10-CM | POA: Diagnosis not present

## 2016-06-30 DIAGNOSIS — R262 Difficulty in walking, not elsewhere classified: Secondary | ICD-10-CM | POA: Diagnosis not present

## 2016-07-04 DIAGNOSIS — M48061 Spinal stenosis, lumbar region without neurogenic claudication: Secondary | ICD-10-CM | POA: Diagnosis not present

## 2016-07-04 DIAGNOSIS — R262 Difficulty in walking, not elsewhere classified: Secondary | ICD-10-CM | POA: Diagnosis not present

## 2016-07-04 DIAGNOSIS — R531 Weakness: Secondary | ICD-10-CM | POA: Diagnosis not present

## 2016-07-04 DIAGNOSIS — I10 Essential (primary) hypertension: Secondary | ICD-10-CM | POA: Diagnosis not present

## 2016-07-08 DIAGNOSIS — R531 Weakness: Secondary | ICD-10-CM | POA: Diagnosis not present

## 2016-07-08 DIAGNOSIS — M48061 Spinal stenosis, lumbar region without neurogenic claudication: Secondary | ICD-10-CM | POA: Diagnosis not present

## 2016-07-08 DIAGNOSIS — I10 Essential (primary) hypertension: Secondary | ICD-10-CM | POA: Diagnosis not present

## 2016-07-08 DIAGNOSIS — R262 Difficulty in walking, not elsewhere classified: Secondary | ICD-10-CM | POA: Diagnosis not present

## 2016-07-12 DIAGNOSIS — R262 Difficulty in walking, not elsewhere classified: Secondary | ICD-10-CM | POA: Diagnosis not present

## 2016-07-12 DIAGNOSIS — M48061 Spinal stenosis, lumbar region without neurogenic claudication: Secondary | ICD-10-CM | POA: Diagnosis not present

## 2016-07-12 DIAGNOSIS — I10 Essential (primary) hypertension: Secondary | ICD-10-CM | POA: Diagnosis not present

## 2016-07-12 DIAGNOSIS — R531 Weakness: Secondary | ICD-10-CM | POA: Diagnosis not present

## 2016-07-16 DIAGNOSIS — R531 Weakness: Secondary | ICD-10-CM | POA: Diagnosis not present

## 2016-07-16 DIAGNOSIS — R262 Difficulty in walking, not elsewhere classified: Secondary | ICD-10-CM | POA: Diagnosis not present

## 2016-07-16 DIAGNOSIS — M48061 Spinal stenosis, lumbar region without neurogenic claudication: Secondary | ICD-10-CM | POA: Diagnosis not present

## 2016-07-16 DIAGNOSIS — I10 Essential (primary) hypertension: Secondary | ICD-10-CM | POA: Diagnosis not present

## 2016-07-18 DIAGNOSIS — R262 Difficulty in walking, not elsewhere classified: Secondary | ICD-10-CM | POA: Diagnosis not present

## 2016-07-18 DIAGNOSIS — R531 Weakness: Secondary | ICD-10-CM | POA: Diagnosis not present

## 2016-07-18 DIAGNOSIS — M48061 Spinal stenosis, lumbar region without neurogenic claudication: Secondary | ICD-10-CM | POA: Diagnosis not present

## 2016-07-18 DIAGNOSIS — I10 Essential (primary) hypertension: Secondary | ICD-10-CM | POA: Diagnosis not present

## 2016-07-23 DIAGNOSIS — R531 Weakness: Secondary | ICD-10-CM | POA: Diagnosis not present

## 2016-07-23 DIAGNOSIS — M48061 Spinal stenosis, lumbar region without neurogenic claudication: Secondary | ICD-10-CM | POA: Diagnosis not present

## 2016-07-23 DIAGNOSIS — R262 Difficulty in walking, not elsewhere classified: Secondary | ICD-10-CM | POA: Diagnosis not present

## 2016-07-23 DIAGNOSIS — I10 Essential (primary) hypertension: Secondary | ICD-10-CM | POA: Diagnosis not present

## 2016-07-25 DIAGNOSIS — R531 Weakness: Secondary | ICD-10-CM | POA: Diagnosis not present

## 2016-07-25 DIAGNOSIS — M48061 Spinal stenosis, lumbar region without neurogenic claudication: Secondary | ICD-10-CM | POA: Diagnosis not present

## 2016-07-25 DIAGNOSIS — R262 Difficulty in walking, not elsewhere classified: Secondary | ICD-10-CM | POA: Diagnosis not present

## 2016-07-25 DIAGNOSIS — I10 Essential (primary) hypertension: Secondary | ICD-10-CM | POA: Diagnosis not present

## 2016-07-27 DIAGNOSIS — M159 Polyosteoarthritis, unspecified: Secondary | ICD-10-CM | POA: Diagnosis not present

## 2016-07-27 DIAGNOSIS — I10 Essential (primary) hypertension: Secondary | ICD-10-CM | POA: Diagnosis not present

## 2016-07-27 DIAGNOSIS — R531 Weakness: Secondary | ICD-10-CM | POA: Diagnosis not present

## 2016-07-27 DIAGNOSIS — M48061 Spinal stenosis, lumbar region without neurogenic claudication: Secondary | ICD-10-CM | POA: Diagnosis not present

## 2016-07-27 DIAGNOSIS — Z8673 Personal history of transient ischemic attack (TIA), and cerebral infarction without residual deficits: Secondary | ICD-10-CM | POA: Diagnosis not present

## 2016-07-27 DIAGNOSIS — R269 Unspecified abnormalities of gait and mobility: Secondary | ICD-10-CM | POA: Diagnosis not present

## 2016-07-27 DIAGNOSIS — M810 Age-related osteoporosis without current pathological fracture: Secondary | ICD-10-CM | POA: Diagnosis not present

## 2016-07-30 DIAGNOSIS — M48061 Spinal stenosis, lumbar region without neurogenic claudication: Secondary | ICD-10-CM | POA: Diagnosis not present

## 2016-07-30 DIAGNOSIS — M810 Age-related osteoporosis without current pathological fracture: Secondary | ICD-10-CM | POA: Diagnosis not present

## 2016-07-30 DIAGNOSIS — R269 Unspecified abnormalities of gait and mobility: Secondary | ICD-10-CM | POA: Diagnosis not present

## 2016-07-30 DIAGNOSIS — I10 Essential (primary) hypertension: Secondary | ICD-10-CM | POA: Diagnosis not present

## 2016-07-30 DIAGNOSIS — M159 Polyosteoarthritis, unspecified: Secondary | ICD-10-CM | POA: Diagnosis not present

## 2016-07-30 DIAGNOSIS — R531 Weakness: Secondary | ICD-10-CM | POA: Diagnosis not present

## 2016-08-01 DIAGNOSIS — M48061 Spinal stenosis, lumbar region without neurogenic claudication: Secondary | ICD-10-CM | POA: Diagnosis not present

## 2016-08-01 DIAGNOSIS — R269 Unspecified abnormalities of gait and mobility: Secondary | ICD-10-CM | POA: Diagnosis not present

## 2016-08-01 DIAGNOSIS — R531 Weakness: Secondary | ICD-10-CM | POA: Diagnosis not present

## 2016-08-01 DIAGNOSIS — M810 Age-related osteoporosis without current pathological fracture: Secondary | ICD-10-CM | POA: Diagnosis not present

## 2016-08-01 DIAGNOSIS — M159 Polyosteoarthritis, unspecified: Secondary | ICD-10-CM | POA: Diagnosis not present

## 2016-08-01 DIAGNOSIS — I10 Essential (primary) hypertension: Secondary | ICD-10-CM | POA: Diagnosis not present

## 2016-08-06 DIAGNOSIS — I10 Essential (primary) hypertension: Secondary | ICD-10-CM | POA: Diagnosis not present

## 2016-08-06 DIAGNOSIS — M48061 Spinal stenosis, lumbar region without neurogenic claudication: Secondary | ICD-10-CM | POA: Diagnosis not present

## 2016-08-06 DIAGNOSIS — R269 Unspecified abnormalities of gait and mobility: Secondary | ICD-10-CM | POA: Diagnosis not present

## 2016-08-06 DIAGNOSIS — R531 Weakness: Secondary | ICD-10-CM | POA: Diagnosis not present

## 2016-08-06 DIAGNOSIS — M159 Polyosteoarthritis, unspecified: Secondary | ICD-10-CM | POA: Diagnosis not present

## 2016-08-06 DIAGNOSIS — M810 Age-related osteoporosis without current pathological fracture: Secondary | ICD-10-CM | POA: Diagnosis not present

## 2016-08-12 DIAGNOSIS — R269 Unspecified abnormalities of gait and mobility: Secondary | ICD-10-CM | POA: Diagnosis not present

## 2016-08-12 DIAGNOSIS — I10 Essential (primary) hypertension: Secondary | ICD-10-CM | POA: Diagnosis not present

## 2016-08-12 DIAGNOSIS — M159 Polyosteoarthritis, unspecified: Secondary | ICD-10-CM | POA: Diagnosis not present

## 2016-08-12 DIAGNOSIS — R531 Weakness: Secondary | ICD-10-CM | POA: Diagnosis not present

## 2016-08-12 DIAGNOSIS — M48061 Spinal stenosis, lumbar region without neurogenic claudication: Secondary | ICD-10-CM | POA: Diagnosis not present

## 2016-08-12 DIAGNOSIS — M810 Age-related osteoporosis without current pathological fracture: Secondary | ICD-10-CM | POA: Diagnosis not present

## 2016-08-15 DIAGNOSIS — M159 Polyosteoarthritis, unspecified: Secondary | ICD-10-CM | POA: Diagnosis not present

## 2016-08-15 DIAGNOSIS — M810 Age-related osteoporosis without current pathological fracture: Secondary | ICD-10-CM | POA: Diagnosis not present

## 2016-08-15 DIAGNOSIS — M48061 Spinal stenosis, lumbar region without neurogenic claudication: Secondary | ICD-10-CM | POA: Diagnosis not present

## 2016-08-15 DIAGNOSIS — I10 Essential (primary) hypertension: Secondary | ICD-10-CM | POA: Diagnosis not present

## 2016-08-15 DIAGNOSIS — R531 Weakness: Secondary | ICD-10-CM | POA: Diagnosis not present

## 2016-08-15 DIAGNOSIS — R269 Unspecified abnormalities of gait and mobility: Secondary | ICD-10-CM | POA: Diagnosis not present

## 2016-08-19 DIAGNOSIS — I10 Essential (primary) hypertension: Secondary | ICD-10-CM | POA: Diagnosis not present

## 2016-08-19 DIAGNOSIS — M48061 Spinal stenosis, lumbar region without neurogenic claudication: Secondary | ICD-10-CM | POA: Diagnosis not present

## 2016-08-19 DIAGNOSIS — M810 Age-related osteoporosis without current pathological fracture: Secondary | ICD-10-CM | POA: Diagnosis not present

## 2016-08-19 DIAGNOSIS — M159 Polyosteoarthritis, unspecified: Secondary | ICD-10-CM | POA: Diagnosis not present

## 2016-08-19 DIAGNOSIS — R531 Weakness: Secondary | ICD-10-CM | POA: Diagnosis not present

## 2016-08-19 DIAGNOSIS — R269 Unspecified abnormalities of gait and mobility: Secondary | ICD-10-CM | POA: Diagnosis not present

## 2016-08-21 DIAGNOSIS — M48061 Spinal stenosis, lumbar region without neurogenic claudication: Secondary | ICD-10-CM | POA: Diagnosis not present

## 2016-08-21 DIAGNOSIS — M159 Polyosteoarthritis, unspecified: Secondary | ICD-10-CM | POA: Diagnosis not present

## 2016-08-21 DIAGNOSIS — I10 Essential (primary) hypertension: Secondary | ICD-10-CM | POA: Diagnosis not present

## 2016-08-21 DIAGNOSIS — R269 Unspecified abnormalities of gait and mobility: Secondary | ICD-10-CM | POA: Diagnosis not present

## 2016-08-21 DIAGNOSIS — M810 Age-related osteoporosis without current pathological fracture: Secondary | ICD-10-CM | POA: Diagnosis not present

## 2016-08-21 DIAGNOSIS — R531 Weakness: Secondary | ICD-10-CM | POA: Diagnosis not present

## 2016-09-11 ENCOUNTER — Telehealth: Payer: Self-pay | Admitting: Family Medicine

## 2016-09-11 NOTE — Telephone Encounter (Signed)
Patient requested a call back from Dr Rance Muir "assistant"..  Could you give her a call back  Thanks

## 2016-09-11 NOTE — Telephone Encounter (Signed)
Message relayed to patient. Verbalized understanding and denied questions.   

## 2016-09-11 NOTE — Telephone Encounter (Signed)
Make sure Jennifer Murray is elevating her feet when at rest to help with circulation. Looks like Jennifer Murray has an appt in 2 weeks with Dr. Jeananne Rama and one in 2 months with [facility] but Jennifer Murray should come in sooner if starting to have severe pain or worsening sxs.

## 2016-09-11 NOTE — Telephone Encounter (Signed)
Spoke with patient. She is concerned about her feet. She stated that her feet (bilateral) has been abnormal in color for off and on for a while. Pt states they are red and sometimes even purplish. Pt thought it may be due to the fact she had been wearing shoes w/o sock, since she has difficulty getting her socks on. Pt has been staying barefoot at home w/o any changes.  There is no pain, or swelling. Pt states she has tried A&D Ointment, an old rash creme she had, and healing cream w/o change. Please advise.

## 2016-09-12 ENCOUNTER — Telehealth (INDEPENDENT_AMBULATORY_CARE_PROVIDER_SITE_OTHER): Payer: Self-pay

## 2016-09-12 NOTE — Telephone Encounter (Signed)
Patient daughter called stating that her mother feet having a purplish color while hanging downwards,but while elevated the feet go back to normal.She also stated that her mom have not been using the lymph pump as directed and they called the primary and they already advise for her to elevate.I spoke with JD and he advise for her to elevate and use the lymph pump.

## 2016-09-19 ENCOUNTER — Emergency Department: Payer: Medicare Other

## 2016-09-19 ENCOUNTER — Inpatient Hospital Stay
Admission: EM | Admit: 2016-09-19 | Discharge: 2016-09-22 | DRG: 446 | Disposition: A | Discharge status: Home Health | Payer: Medicare Other | Attending: Surgery | Admitting: Surgery

## 2016-09-19 DIAGNOSIS — E669 Obesity, unspecified: Secondary | ICD-10-CM | POA: Diagnosis present

## 2016-09-19 DIAGNOSIS — Z881 Allergy status to other antibiotic agents status: Secondary | ICD-10-CM | POA: Diagnosis not present

## 2016-09-19 DIAGNOSIS — R5381 Other malaise: Secondary | ICD-10-CM | POA: Diagnosis present

## 2016-09-19 DIAGNOSIS — M810 Age-related osteoporosis without current pathological fracture: Secondary | ICD-10-CM | POA: Diagnosis present

## 2016-09-19 DIAGNOSIS — Z79899 Other long term (current) drug therapy: Secondary | ICD-10-CM

## 2016-09-19 DIAGNOSIS — M48061 Spinal stenosis, lumbar region without neurogenic claudication: Secondary | ICD-10-CM | POA: Diagnosis present

## 2016-09-19 DIAGNOSIS — Z8673 Personal history of transient ischemic attack (TIA), and cerebral infarction without residual deficits: Secondary | ICD-10-CM

## 2016-09-19 DIAGNOSIS — Z7902 Long term (current) use of antithrombotics/antiplatelets: Secondary | ICD-10-CM

## 2016-09-19 DIAGNOSIS — R1011 Right upper quadrant pain: Secondary | ICD-10-CM

## 2016-09-19 DIAGNOSIS — K8000 Calculus of gallbladder with acute cholecystitis without obstruction: Principal | ICD-10-CM | POA: Diagnosis present

## 2016-09-19 DIAGNOSIS — R112 Nausea with vomiting, unspecified: Secondary | ICD-10-CM | POA: Diagnosis present

## 2016-09-19 DIAGNOSIS — Z96652 Presence of left artificial knee joint: Secondary | ICD-10-CM | POA: Diagnosis present

## 2016-09-19 DIAGNOSIS — M1990 Unspecified osteoarthritis, unspecified site: Secondary | ICD-10-CM | POA: Diagnosis present

## 2016-09-19 DIAGNOSIS — K810 Acute cholecystitis: Secondary | ICD-10-CM | POA: Diagnosis present

## 2016-09-19 DIAGNOSIS — Z6835 Body mass index (BMI) 35.0-35.9, adult: Secondary | ICD-10-CM

## 2016-09-19 DIAGNOSIS — K8020 Calculus of gallbladder without cholecystitis without obstruction: Secondary | ICD-10-CM | POA: Diagnosis not present

## 2016-09-19 LAB — URINALYSIS, COMPLETE (UACMP) WITH MICROSCOPIC
Bacteria, UA: NONE SEEN
Bilirubin Urine: NEGATIVE
Glucose, UA: NEGATIVE mg/dL
Hgb urine dipstick: NEGATIVE
KETONES UR: NEGATIVE mg/dL
Leukocytes, UA: NEGATIVE
Nitrite: NEGATIVE
PH: 6 (ref 5.0–8.0)
Protein, ur: NEGATIVE mg/dL
SPECIFIC GRAVITY, URINE: 1.018 (ref 1.005–1.030)

## 2016-09-19 LAB — COMPREHENSIVE METABOLIC PANEL
ALK PHOS: 83 U/L (ref 38–126)
ALT: 15 U/L (ref 14–54)
AST: 30 U/L (ref 15–41)
Albumin: 3.6 g/dL (ref 3.5–5.0)
Anion gap: 5 (ref 5–15)
BILIRUBIN TOTAL: 0.8 mg/dL (ref 0.3–1.2)
BUN: 17 mg/dL (ref 6–20)
CO2: 25 mmol/L (ref 22–32)
CREATININE: 0.57 mg/dL (ref 0.44–1.00)
Calcium: 9.7 mg/dL (ref 8.9–10.3)
Chloride: 109 mmol/L (ref 101–111)
Glucose, Bld: 181 mg/dL — ABNORMAL HIGH (ref 65–99)
Potassium: 3.5 mmol/L (ref 3.5–5.1)
Sodium: 139 mmol/L (ref 135–145)
Total Protein: 6.2 g/dL — ABNORMAL LOW (ref 6.5–8.1)

## 2016-09-19 LAB — CBC
HCT: 42.9 % (ref 35.0–47.0)
Hemoglobin: 14.5 g/dL (ref 12.0–16.0)
MCH: 30.7 pg (ref 26.0–34.0)
MCHC: 33.8 g/dL (ref 32.0–36.0)
MCV: 90.8 fL (ref 80.0–100.0)
PLATELETS: 170 10*3/uL (ref 150–440)
RBC: 4.73 MIL/uL (ref 3.80–5.20)
RDW: 13.4 % (ref 11.5–14.5)
WBC: 6.4 10*3/uL (ref 3.6–11.0)

## 2016-09-19 LAB — LIPASE, BLOOD: Lipase: 19 U/L (ref 11–51)

## 2016-09-19 MED ORDER — ENOXAPARIN SODIUM 40 MG/0.4ML ~~LOC~~ SOLN
40.0000 mg | SUBCUTANEOUS | Status: DC
  Administered 2016-09-19 – 2016-09-21 (×3): 40 mg via SUBCUTANEOUS
  Filled 2016-09-19 (×3): qty 0.4

## 2016-09-19 MED ORDER — MORPHINE SULFATE (PF) 2 MG/ML IV SOLN: 2.0000 mg | INTRAVENOUS | Status: DC | PRN

## 2016-09-19 MED ORDER — ACETAMINOPHEN 325 MG PO TABS
650.0000 mg | ORAL_TABLET | Freq: Four times a day (QID) | ORAL | Status: DC | PRN
Start: 2016-09-19 — End: 2016-09-22

## 2016-09-19 MED ORDER — ONDANSETRON HCL 4 MG/2ML IJ SOLN: 4.0000 mg | Freq: Four times a day (QID) | INTRAMUSCULAR | Status: DC | PRN

## 2016-09-19 MED ORDER — POLYETHYLENE GLYCOL 3350 17 G PO PACK: 17.0000 g | PACK | Freq: Every day | ORAL | Status: DC | PRN

## 2016-09-19 MED ORDER — SODIUM CHLORIDE 0.9 % IV SOLN
INTRAVENOUS | Status: DC
  Administered 2016-09-19 – 2016-09-21 (×2): via INTRAVENOUS

## 2016-09-19 MED ORDER — PIPERACILLIN-TAZOBACTAM 3.375 G IVPB 30 MIN
3.3750 g | Freq: Once | INTRAVENOUS | Status: AC
  Administered 2016-09-19: 3.375 g via INTRAVENOUS
  Filled 2016-09-19: qty 50

## 2016-09-19 MED ORDER — ONDANSETRON 4 MG PO TBDP: 4.0000 mg | ORAL_TABLET | Freq: Four times a day (QID) | ORAL | Status: DC | PRN

## 2016-09-19 MED ORDER — PANTOPRAZOLE SODIUM 40 MG IV SOLR
40.0000 mg | Freq: Every day | INTRAVENOUS | Status: DC
  Administered 2016-09-19 – 2016-09-21 (×3): 40 mg via INTRAVENOUS
  Filled 2016-09-19 (×2): qty 40

## 2016-09-19 MED ORDER — PIPERACILLIN-TAZOBACTAM 3.375 G IVPB
3.3750 g | Freq: Three times a day (TID) | INTRAVENOUS | Status: DC
  Administered 2016-09-20 – 2016-09-21 (×5): 3.375 g via INTRAVENOUS
  Filled 2016-09-19 (×8): qty 50

## 2016-09-19 NOTE — ED Notes (Signed)
Pt assisted to toilet, then to [place] att

## 2016-09-19 NOTE — ED Triage Notes (Signed)
Pt to ED c/o abdominal pain. Per pt RUQ abdominal pain started sometime within the last week, and is worse with movement. Pt reports 2 episodes of vomiting this week, but denies nausea and diarrhea. Pt alert and oriented in no acute distress at this time.

## 2016-09-19 NOTE — H&P (Signed)
Date of Admission:  09/19/2016  Reason for Admission:  Abdominal pain  History of Present Illness: Jennifer Murray is a 83 y.o. female who presents with a 1-week history of right upper quadrant abdominal pain.  She is unclear as to how it started, but it hurts her to laugh or cough or take a deep breath.  She has had one episode of nausea and vomiting at home, but otherwise no worsening pain with oral intake.  She pain has continued all week and finally came to hospital today for further evaluation.  Denies any fevers, chills, chest pain, shortness of breath.  Denies other areas of abdominal pain.  Reports having a bout of diarrhea a couple weeks ago, but otherwise normal bowel movements.   Past Medical History: Past Medical History:  Diagnosis Date  . Acute cerebrovascular insufficiency transient focal neurologic deficit   . Arthritis   . Cancer (HCC)    basil cell  . Diverticulosis   . Leg edema   . Lumbar spinal stenosis   . Obesity   . Osteoporosis   . Stroke [facility])      Past Surgical History: Past Surgical History:  Procedure Laterality Date  . BREAST BIOPSY    . COLONOSCOPY W/ POLYPECTOMY    . EYE SURGERY Left    cataract  . JOINT REPLACEMENT Left    knee    Home Medications: Prior to Admission medications   Medication Sig Start Date End Date Taking? Authorizing Provider  B Complex Vitamins (B COMPLEX PO) Take 1 tablet by mouth daily.    Yes Historical Provider, MD  clopidogrel (PLAVIX) 75 MG tablet Take 1 tablet (75 mg total) by mouth daily. 02/03/16  Yes Guadalupe Maple, MD  clotrimazole-betamethasone (LOTRISONE) cream Apply 1 application topically 2 (two) times daily. 04/02/16  Yes Guadalupe Maple, MD  Multiple Vitamins-Minerals (CENTRUM SILVER ADULT 50+ PO) Take 1 tablet by mouth daily.    Yes Historical Provider, MD  Omega-3 Fatty Acids (FISH OIL BURP-LESS PO) Take 1 capsule by mouth daily.    Yes Historical Provider, MD  cetirizine (ZYRTEC) 10 MG tablet Take 1 tablet  (10 mg total) by mouth daily. Patient not taking: Reported on 09/19/2016 03/14/16   Volney American, PA-C    Allergies: Allergies  Allergen Reactions  . Ciprocin-Fluocin-Procin [Fluocinolone Acetonide] Other (See Comments)    dizziness  . Ciprofloxacin Other (See Comments)    Dizziness.    Social History:  reports that she is a non-smoker but has been exposed to tobacco smoke. She has never used smokeless tobacco. She reports that she does not drink alcohol or use drugs.   Family History: Family History  Problem Relation Age of Onset  . Cancer Mother     colon  . Heart attack Father   . Heart attack Brother   . Diabetes Paternal Grandmother   . Cancer Paternal Grandmother     breast  . Heart attack Paternal Grandfather     Review of Systems: Review of Systems  Constitutional: Negative for chills and fever.  HENT: Negative for hearing loss.   Eyes: Negative for blurred vision.  Respiratory: Negative for cough and shortness of breath.   Cardiovascular: Negative for chest pain.  Gastrointestinal: Positive for abdominal pain, nausea and vomiting.  Genitourinary: Negative for dysuria.  Musculoskeletal: Negative for myalgias.  Skin: Negative for rash.  Neurological: Negative for dizziness.  Psychiatric/Behavioral: Negative for depression.    Physical Exam BP (!) 128/96   Pulse 74  Temp 97.7 F (36.5 C) (Oral)   Resp 18   Ht 5\' 6"  (1.676 m)   Wt 108 kg (238 lb)   SpO2 97%   BMI 38.41 kg/m  CONSTITUTIONAL: No acute distress HEENT:  Normocephalic, atraumatic, extraocular motion intact. NECK: Trachea is midline, and there is no jugular venous distension.  RESPIRATORY:  Lungs are clear, and breath sounds are equal bilaterally. Normal respiratory effort without pathologic use of accessory muscles. CARDIOVASCULAR: Heart is regular without murmurs, gallops, or rubs. GI: The abdomen is soft, obese, nondistended, with tenderness to palpation in the right upper  quadrant.  There is a positive Murphy's sign. There were no palpable masses. MUSCULOSKELETAL:  Normal muscle strength and tone in all four extremities.  No peripheral edema or cyanosis. SKIN: Skin turgor is normal. There are no pathologic skin lesions.  NEUROLOGIC:  Motor and sensation is grossly normal.  Cranial nerves are grossly intact. PSYCH:  Alert and oriented to person, place and time. Affect is normal.  Laboratory Analysis: Results for orders placed or performed during the hospital encounter of 09/19/16 (from the past 24 hour(s))  Lipase, blood     Status: None   Collection Time: 09/19/16  1:00 PM  Result Value Ref Range   Lipase 19 11 - 51 U/L  Comprehensive metabolic panel     Status: Abnormal   Collection Time: 09/19/16  1:00 PM  Result Value Ref Range   Sodium 139 135 - 145 mmol/L   Potassium 3.5 3.5 - 5.1 mmol/L   Chloride 109 101 - 111 mmol/L   CO2 25 22 - 32 mmol/L   Glucose, Bld 181 (H) 65 - 99 mg/dL   BUN 17 6 - 20 mg/dL   Creatinine, Ser 0.57 0.44 - 1.00 mg/dL   Calcium 9.7 8.9 - 10.3 mg/dL   Total Protein 6.2 (L) 6.5 - 8.1 g/dL   Albumin 3.6 3.5 - 5.0 g/dL   AST 30 15 - 41 U/L   ALT 15 14 - 54 U/L   Alkaline Phosphatase 83 38 - 126 U/L   Total Bilirubin 0.8 0.3 - 1.2 mg/dL   GFR calc non Af Amer >60 >60 mL/min   GFR calc Af Amer >60 >60 mL/min   Anion gap 5 5 - 15  CBC     Status: None   Collection Time: 09/19/16  1:00 PM  Result Value Ref Range   WBC 6.4 3.6 - 11.0 K/uL   RBC 4.73 3.80 - 5.20 MIL/uL   Hemoglobin 14.5 12.0 - 16.0 g/dL   HCT 42.9 35.0 - 47.0 %   MCV 90.8 80.0 - 100.0 fL   MCH 30.7 26.0 - 34.0 pg   MCHC 33.8 32.0 - 36.0 g/dL   RDW 13.4 11.5 - 14.5 %   Platelets 170 150 - 440 K/uL  Urinalysis, Complete w Microscopic     Status: Abnormal   Collection Time: 09/19/16  1:00 PM  Result Value Ref Range   Color, Urine AMBER (A) YELLOW   APPearance CLOUDY (A) CLEAR   Specific Gravity, Urine 1.018 1.005 - 1.030   pH 6.0 5.0 - 8.0   Glucose,  UA NEGATIVE NEGATIVE mg/dL   Hgb urine dipstick NEGATIVE NEGATIVE   Bilirubin Urine NEGATIVE NEGATIVE   Ketones, ur NEGATIVE NEGATIVE mg/dL   Protein, ur NEGATIVE NEGATIVE mg/dL   Nitrite NEGATIVE NEGATIVE   Leukocytes, UA NEGATIVE NEGATIVE   RBC / HPF 0-5 0 - 5 RBC/hpf   WBC, UA 0-5 0 - 5  WBC/hpf   Bacteria, UA NONE SEEN NONE SEEN   Squamous Epithelial / LPF 0-5 (A) NONE SEEN   Mucous PRESENT     Imaging: US Abdomen Limited Ruq  Result Date: 09/19/2016 CLINICAL DATA:  Right upper quadrant pain and tenderness over the last 3 days. EXAM: US ABDOMEN LIMITED - RIGHT UPPER QUADRANT COMPARISON:  None. FINDINGS: Gallbladder: Multiple gallstones dependent within the gallbladder. Largest stone 1.5 cm in diameter. Gallbladder wall thickness upper limits of normal. Negative Murphy sign. Common bile duct: Diameter: 2 mm, normal Liver: Mildly echogenic suggesting mild fatty change.  No focal lesion. IMPRESSION: Multiple gallstones. Borderline gallbladder wall thickening. Negative Murphy sign. No ductal dilatation. Electronically Signed   By: Nelson Chimes M.D.   On: 09/19/2016 17:25    Assessment and Plan: [name] who presents with abdominal pain.  This has been ongoing for a week.  Despite of normal labs and not too impressive ultrasound, the patient does have significant pain in the right upper quadrant with positive Murphy's sign.  I have viewed the patient's imaging study and reviewed her labs.  The patient will be admitted to the surgery team.  She will be NPO with IV fluid hydration.  She will be started on IV antibiotics for possible early cholecystitis given her findings.  Will attempt conservative management.  Will hold her Plavix for now in case she requires surgical management.  Discussed with patient that if there is no improvement, there may be need for surgical intervention.  The patient is not interested in surgery if possible.  At this time there is no acute surgical  intervention needed.  Patient understands this plan and all questions from her and her family have been answered.   Melvyn Neth, [place]

## 2016-09-19 NOTE — ED Notes (Signed)
Pt transport to 210 

## 2016-09-19 NOTE — ED Provider Notes (Addendum)
[facility] Emergency Department Provider Note  ____________________________________________  Time seen: Approximately 3:07 PM  I have reviewed the triage vital signs and the nursing notes.   HISTORY  Chief Complaint Abdominal Pain    HPI Jennifer Murray is a 83 y.o. female who complains of 2 weeks of worsening right upper quadrant abdominal pain. Worse with movement and laughing. Colicky. No vomiting or diarrhea or constipation. Denies any specific relation to eating. No fevers or chills. Nonradiating. Currently mild but at times is severe. Described as aching.    Past Medical History:  Diagnosis Date  . Acute cerebrovascular insufficiency transient focal neurologic deficit   . Arthritis   . Cancer (HCC)    basil cell  . Diverticulosis   . Leg edema   . Lumbar spinal stenosis   . Obesity   . Osteoporosis   . Stroke [facility])      Patient Active Problem List   Diagnosis Date Noted  . Candidal intertrigo 04/02/2016  . Advanced directives, counseling/discussion 04/02/2016  . Essential hypertension 04/02/2015  . Lumbar spinal stenosis 04/02/2015  . Leg edema 04/02/2015     Past Surgical History:  Procedure Laterality Date  . BREAST BIOPSY    . COLONOSCOPY W/ POLYPECTOMY    . EYE SURGERY Left    cataract  . JOINT REPLACEMENT Left    knee     Prior to Admission medications   Medication Sig Start Date End Date Taking? Authorizing Provider  B Complex Vitamins (B COMPLEX PO) Take 1 tablet by mouth daily.    Yes Historical Provider, MD  clopidogrel (PLAVIX) 75 MG tablet Take 1 tablet (75 mg total) by mouth daily. 02/03/16  Yes Guadalupe Maple, MD  clotrimazole-betamethasone (LOTRISONE) cream Apply 1 application topically 2 (two) times daily. 04/02/16  Yes Guadalupe Maple, MD  Multiple Vitamins-Minerals (CENTRUM SILVER ADULT 50+ PO) Take 1 tablet by mouth daily.    Yes Historical Provider, MD  Omega-3 Fatty Acids (FISH OIL BURP-LESS PO) Take 1 capsule  by mouth daily.    Yes Historical Provider, MD  cetirizine (ZYRTEC) 10 MG tablet Take 1 tablet (10 mg total) by mouth daily. Patient not taking: Reported on 09/19/2016 03/14/16   Volney American, PA-C     Allergies Ciprocin-fluocin-procin [fluocinolone acetonide] and Ciprofloxacin   Family History  Problem Relation Age of Onset  . Cancer Mother     colon  . Heart attack Father   . Heart attack Brother   . Diabetes Paternal Grandmother   . Cancer Paternal Grandmother     breast  . Heart attack Paternal Grandfather     Social History Social History  Substance Use Topics  . Smoking status: Passive Smoke Exposure - Never Smoker  . Smokeless tobacco: Never Used  . Alcohol use No    Review of Systems  Constitutional:   No fever or chills.  ENT:   No sore throat. No rhinorrhea. Lymphatic: No swollen glands, No extremity swelling Endocrine: No hot/cold flashes. No significant weight change. No neck swelling. Cardiovascular:   No chest pain or syncope. Respiratory:   No dyspnea or cough. Gastrointestinal:   Positive as above for abdominal pain without vomiting and diarrhea.  Genitourinary:   Negative for dysuria or difficulty urinating. Musculoskeletal:   Negative for focal pain or swelling Neurological:   Negative for headaches or weakness. All other systems reviewed and are negative except as documented above in ROS and HPI.  ____________________________________________   PHYSICAL EXAM:  VITAL SIGNS: ED Triage Vitals  Enc Vitals Group     BP 09/19/16 1256 108/62     Pulse Rate 09/19/16 1256 88     Resp 09/19/16 1256 18     Temp 09/19/16 1256 97.7 F (36.5 C)     Temp Source 09/19/16 1256 Oral     SpO2 09/19/16 1256 96 %     Weight 09/19/16 1259 238 lb (108 kg)     Height 09/19/16 1259 5\' 6"  (1.676 m)     Head Circumference --      Peak Flow --      Pain Score 09/19/16 1256 0     Pain Loc --      Pain Edu? --      Excl. in [place]? --     Vital signs  reviewed, nursing assessments reviewed.   Constitutional:   Alert and oriented. Well appearing and in no distress. Eyes:   No scleral icterus. No conjunctival pallor. PERRL. EOMI.  No nystagmus. ENT   Head:   Normocephalic and atraumatic.   Nose:   No congestion/rhinnorhea. No septal hematoma   Mouth/Throat:   MMM, no pharyngeal erythema. No peritonsillar mass.    Neck:   No stridor. No SubQ emphysema. No meningismus. Hematological/Lymphatic/Immunilogical:   No cervical lymphadenopathy. Cardiovascular:   RRR. Symmetric bilateral radial and DP pulses.  No murmurs.  Respiratory:   Normal respiratory effort without tachypnea nor retractions. Breath sounds are clear and equal bilaterally. No wheezes/rales/rhonchi. Gastrointestinal:   Soft with right upper quadrant tenderness. Non distended. There is no CVA tenderness.  No rebound, rigidity, or guarding. Genitourinary:   deferred Musculoskeletal:   Normal range of motion in all extremities. No joint effusions.  No lower extremity tenderness.  No edema. Neurologic:   Normal speech and language.  CN 2-10 normal. Motor grossly intact. No gross focal neurologic deficits are appreciated.  Skin:    Skin is warm, dry and intact. No rash noted.  No petechiae, purpura, or bullae.  ____________________________________________    LABS (pertinent positives/negatives) (all labs ordered are listed, but only abnormal results are displayed) Labs Reviewed  COMPREHENSIVE METABOLIC PANEL - Abnormal; Notable for the following:       Result Value   Glucose, Bld 181 (*)    Total Protein 6.2 (*)    All other components within normal limits  URINALYSIS, COMPLETE (UACMP) WITH MICROSCOPIC - Abnormal; Notable for the following:    Color, Urine AMBER (*)    APPearance CLOUDY (*)    Squamous Epithelial / LPF 0-5 (*)    All other components within normal limits  LIPASE, BLOOD  CBC    ____________________________________________   EKG  Interpreted by me Sinus rhythm rate of 79. Left axis, first-degree AV block PR interval 214. Right bundle branch block. No acute ischemic changes  ____________________________________________    RADIOLOGY  US Abdomen Limited Ruq  Result Date: 09/19/2016 CLINICAL DATA:  Right upper quadrant pain and tenderness over the last 3 days. EXAM: US ABDOMEN LIMITED - RIGHT UPPER QUADRANT COMPARISON:  None. FINDINGS: Gallbladder: Multiple gallstones dependent within the gallbladder. Largest stone 1.5 cm in diameter. Gallbladder wall thickness upper limits of normal. Negative Murphy sign. Common bile duct: Diameter: 2 mm, normal Liver: Mildly echogenic suggesting mild fatty change.  No focal lesion. IMPRESSION: Multiple gallstones. Borderline gallbladder wall thickening. Negative Murphy sign. No ductal dilatation. Electronically Signed   By: Nelson Chimes M.D.   On: 09/19/2016 17:25    ____________________________________________  PROCEDURES Procedures  ____________________________________________   INITIAL IMPRESSION / ASSESSMENT AND PLAN / ED COURSE  Pertinent labs & imaging results that were available during my care of the patient were reviewed by me and considered in my medical decision making (see chart for details).  Patient presents with right upper quadrant abdominal pain with focal tenderness in the area. There is also some chest wall tenderness in the area. We'll check labs and ultrasound right upper quadrant for initial assessment. If negative patient may require further investigation with CT.     Clinical Course as of Sep 19 1904  Fri Sep 19, 2016  1559 Labs negative. Will check [place] ruq.   [PS]  1448 Patient and family updated. Ultrasound result has been discussed with surgery who will evaluate the patient shortly.  [PS]  1856 Surgery in with patient now. Will f/u recs  [PS]  1906 D/w surgery. Will plan to admit for abx  and serial exams.   [PS]    Clinical Course User Index [PS] Carrie Mew, MD     ____________________________________________   FINAL CLINICAL IMPRESSION(S) / ED DIAGNOSES  Final diagnoses:  Right upper quadrant abdominal pain  Calculus of gallbladder without cholecystitis without obstruction      New Prescriptions   No medications on file     Portions of this note were generated with dragon dictation software. Dictation errors may occur despite best attempts at proofreading.    Carrie Mew, MD 09/19/16 1906    Carrie Mew, MD 09/19/16 [phone]

## 2016-09-19 NOTE — Progress Notes (Signed)
Pharmacy Antibiotic Note  Jennifer Murray is a 83 y.o. female admitted on 09/19/2016 with acute cholecystitis.  Pharmacy has been consulted for Zosyn dosing.  Plan: Zosyn 3.375g IV q8h (4 hour infusion).  Height: 5\' 6"  (167.6 cm) Weight: 238 lb (108 kg) IBW/kg (Calculated) : 59.3  Temp (24hrs), Avg:97.7 F (36.5 C), Min:97.7 F (36.5 C), Max:97.7 F (36.5 C)   Recent Labs Lab 09/19/16 1300  WBC 6.4  CREATININE 0.57    Estimated Creatinine Clearance: 61.6 mL/min (by C-G formula based on SCr of 0.57 mg/dL).    Allergies  Allergen Reactions  . Ciprocin-Fluocin-Procin [Fluocinolone Acetonide] Other (See Comments)    dizziness  . Ciprofloxacin Other (See Comments)    Dizziness.    Antimicrobials this admission: Zosyn 4/27 >>   Dose adjustments this admission:  Microbiology results:  Thank you for allowing pharmacy to be a part of this patient's care.  Napoleon Form 09/19/2016 7:55 PM

## 2016-09-20 LAB — CBC WITH DIFFERENTIAL/PLATELET
Basophils Absolute: 0 10*3/uL (ref 0–0.1)
Basophils Relative: 1 %
Eosinophils Absolute: 0.2 10*3/uL (ref 0–0.7)
Eosinophils Relative: 4 %
HEMATOCRIT: 38.2 % (ref 35.0–47.0)
Hemoglobin: 13.2 g/dL (ref 12.0–16.0)
LYMPHS ABS: 1.6 10*3/uL (ref 1.0–3.6)
Lymphocytes Relative: 27 %
MCH: 31.6 pg (ref 26.0–34.0)
MCHC: 34.4 g/dL (ref 32.0–36.0)
MCV: 91.9 fL (ref 80.0–100.0)
MONOS PCT: 10 %
Monocytes Absolute: 0.6 10*3/uL (ref 0.2–0.9)
NEUTROS ABS: 3.4 10*3/uL (ref 1.4–6.5)
NEUTROS PCT: 58 %
Platelets: 149 10*3/uL — ABNORMAL LOW (ref 150–440)
RBC: 4.16 MIL/uL (ref 3.80–5.20)
RDW: 13.1 % (ref 11.5–14.5)
WBC: 5.8 10*3/uL (ref 3.6–11.0)

## 2016-09-20 LAB — BASIC METABOLIC PANEL
ANION GAP: 4 — AB (ref 5–15)
BUN: 16 mg/dL (ref 6–20)
CO2: 27 mmol/L (ref 22–32)
Calcium: 9.1 mg/dL (ref 8.9–10.3)
Chloride: 109 mmol/L (ref 101–111)
Creatinine, Ser: 0.59 mg/dL (ref 0.44–1.00)
GFR calc Af Amer: >60 mL/min (ref >60)
GFR calc non Af Amer: >60 mL/min (ref >60)
GLUCOSE: 114 mg/dL — AB (ref 65–99)
POTASSIUM: 3.5 mmol/L (ref 3.5–5.1)
Sodium: 140 mmol/L (ref 135–145)

## 2016-09-20 LAB — MAGNESIUM: Magnesium: 2.1 mg/dL (ref 1.7–2.4)

## 2016-09-20 NOTE — Progress Notes (Signed)
CC: Cholecystitis Subjective: Significant improvement of pain. She is hungry. No emesis  Objective: Vital signs in last 24 hours: Temp:  [97.7 F (36.5 C)-97.8 F (36.6 C)] 97.8 F (36.6 C) (04/28 0437) Pulse Rate:  [56-88] 56 (04/28 0437) Resp:  [18-20] 20 (04/28 0437) BP: (99-135)/(53-96) 99/53 (04/28 0437) SpO2:  [96 %-99 %] 98 % (04/28 0437) Weight:  [98.1 kg (216 lb 4.8 oz)-108 kg (238 lb)] 98.1 kg (216 lb 4.8 oz) (04/27 2209) Last BM Date: 09/19/16  Intake/Output from previous day: 04/27 0701 - 04/28 0700 In: 658.8 [I.V.:608.8; IV Piggyback:50] Out: 600 [Urine:600] Intake/Output this shift: Total I/O In: -  Out: 250 [Urine:250]  Physical exam: NAD awake and alert Abd: soft, minimal tenderness RUQ no peritonitis and no murphy Ext: well perfused, no edema    Lab Results: CBC   Recent Labs  09/19/16 1300 09/20/16 0410  WBC 6.4 5.8  HGB 14.5 13.2  HCT 42.9 38.2  PLT 170 149*   BMET  Recent Labs  09/19/16 1300 09/20/16 0410  NA 139 140  K 3.5 3.5  CL 109 109  CO2 25 27  GLUCOSE 181* 114*  BUN 17 16  CREATININE 0.57 0.59  CALCIUM 9.7 9.1   PT/INR No results for input(s): LABPROT, INR in the last 72 hours. ABG No results for input(s): PHART, HCO3 in the last 72 hours.  Invalid input(s): PCO2, PO2  Studies/Results: US Abdomen Limited Ruq  Result Date: 09/19/2016 CLINICAL DATA:  Right upper quadrant pain and tenderness over the last 3 days. EXAM: US ABDOMEN LIMITED - RIGHT UPPER QUADRANT COMPARISON:  None. FINDINGS: Gallbladder: Multiple gallstones dependent within the gallbladder. Largest stone 1.5 cm in diameter. Gallbladder wall thickness upper limits of normal. Negative Murphy sign. Common bile duct: Diameter: 2 mm, normal Liver: Mildly echogenic suggesting mild fatty change.  No focal lesion. IMPRESSION: Multiple gallstones. Borderline gallbladder wall thickening. Negative Murphy sign. No ductal dilatation. Electronically Signed   By: Nelson Chimes M.D.   On: 09/19/2016 17:25    Anti-infectives: Anti-infectives    Start     Dose/Rate Route Frequency Ordered Stop   09/20/16 0430  piperacillin-tazobactam (ZOSYN) IVPB 3.375 g     3.375 g 12.5 mL/hr over 240 Minutes Intravenous Every 8 hours 09/19/16 1954     09/19/16 2000  piperacillin-tazobactam (ZOSYN) IVPB 3.375 g     3.375 g 100 mL/hr over 30 Minutes Intravenous  Once 09/19/16 1953 09/19/16 2100      Assessment/Plan: Acute cholecystitis responded to medical therapy. No need for immediate surgical revision at this time. We'll continue antibiotics and will start clear liquid diet. Discussed with the patient and the family in detail.  Caroleen Hamman, MD, [facility]  09/20/2016

## 2016-09-21 MED ORDER — AMOXICILLIN-POT CLAVULANATE 875-125 MG PO TABS
1.0000 | ORAL_TABLET | Freq: Two times a day (BID) | ORAL | Status: DC
  Administered 2016-09-21 – 2016-09-22 (×2): 1 via ORAL
  Filled 2016-09-21 (×2): qty 1

## 2016-09-21 MED ORDER — ENSURE ENLIVE PO LIQD
237.0000 mL | ORAL | Status: DC
  Administered 2016-09-21: 237 mL via ORAL

## 2016-09-21 NOTE — Progress Notes (Signed)
Initial Nutrition Assessment  DOCUMENTATION CODES:   Obesity unspecified  INTERVENTION:   Ensure Enlive po daily, each supplement provides 350 kcal and 20 grams of protein  NUTRITION DIAGNOSIS:   Inadequate oral intake related to acute illness, cholecystitis as evidenced by per patient/family report.  GOAL:   Patient will meet greater than or equal to 90% of their needs  MONITOR:   PO intake, Supplement acceptance, Labs, Weight trends  REASON FOR ASSESSMENT:   Malnutrition Screening Tool    ASSESSMENT:   83 y.o. female who presents with a 1-week history of right upper quadrant abdominal pain. Noted to have acute cholecystitis   Met with pt in room today. Pt sitting up in chair cracking jokes. Pt reports that she is feeling much better today. Pt currently eating 75% of her meals and reports her appetite is good. Pt reports good appetite pta. Per chart, pt appears to have lost wegiht but pt feels her admit weight is incorrect as she weighed 150lbs last week which is more consistent with previous wts. Pt denies any wt loss. Pt cholecystitis being managed medically. RD discussed with pt the importance of avoiding high fatty foods and getting adequate protein. RD will order daily Ensure to help pt meet estimated protein needs.   Medications reviewed and include: lovenox, protonix, zosyn  Labs reviewed  Nutrition-Focused physical exam completed. Findings are no fat depletion, no muscle depletion, and no edema.   Diet Order:  Diet Heart Room service appropriate? Yes; Fluid consistency: Thin  Skin:  Reviewed, no issues  Last BM:  4/27  Height:   Ht Readings from Last 1 Encounters:  09/19/16 _0  (1.676 m)    Weight:   Wt Readings from Last 1 Encounters:  09/19/16 216 lb 4.8 oz (98.1 kg)    Ideal Body Weight:  59 kg  BMI:  Body mass index is 34.91 kg/m.  Estimated Nutritional Needs:   Kcal:  1650-1950kcal/day   Protein:  98-118g/day   Fluid:  >1.6L/day    EDUCATION NEEDS:   No education needs identified at this time  Koleen Distance, RD, LDN Pager #[phone]

## 2016-09-21 NOTE — Progress Notes (Signed)
CC: cholecystitis Subjective: Doing well, minimal pain, taking clears  Objective: Vital signs in last 24 hours: Temp:  [97.4 F (36.3 C)-97.6 F (36.4 C)] 97.6 F (36.4 C) (04/29 1419) Pulse Rate:  [66-71] 68 (04/29 1419) Resp:  [16-20] 16 (04/29 1419) BP: (117-139)/(72-92) 124/92 (04/29 1419) SpO2:  [97 %-99 %] 99 % (04/29 1419) Last BM Date: 09/19/16  Intake/Output from previous day: 04/28 0701 - 04/29 0700 In: 2625 [I.V.:2625] Out: 1100 [Urine:1100] Intake/Output this shift: Total I/O In: 240 [P.O.:240] Out: 200 [Urine:200]  Physical exam: NAD Abd: soft, NT, no murphy  Lab Results: CBC   Recent Labs  09/19/16 1300 09/20/16 0410  WBC 6.4 5.8  HGB 14.5 13.2  HCT 42.9 38.2  PLT 170 149*   BMET  Recent Labs  09/19/16 1300 09/20/16 0410  NA 139 140  K 3.5 3.5  CL 109 109  CO2 25 27  GLUCOSE 181* 114*  BUN 17 16  CREATININE 0.57 0.59  CALCIUM 9.7 9.1   PT/INR No results for input(s): LABPROT, INR in the last 72 hours. ABG No results for input(s): PHART, HCO3 in the last 72 hours.  Invalid input(s): PCO2, PO2  Studies/Results: US Abdomen Limited Ruq  Result Date: 09/19/2016 CLINICAL DATA:  Right upper quadrant pain and tenderness over the last 3 days. EXAM: US ABDOMEN LIMITED - RIGHT UPPER QUADRANT COMPARISON:  None. FINDINGS: Gallbladder: Multiple gallstones dependent within the gallbladder. Largest stone 1.5 cm in diameter. Gallbladder wall thickness upper limits of normal. Negative Murphy sign. Common bile duct: Diameter: 2 mm, normal Liver: Mildly echogenic suggesting mild fatty change.  No focal lesion. IMPRESSION: Multiple gallstones. Borderline gallbladder wall thickening. Negative Murphy sign. No ductal dilatation. Electronically Signed   By: Nelson Chimes M.D.   On: 09/19/2016 17:25    Anti-infectives: Anti-infectives    Start     Dose/Rate Route Frequency Ordered Stop   09/21/16 2200  amoxicillin-clavulanate (AUGMENTIN) 875-125 MG per  tablet 1 tablet     1 tablet Oral Every 12 hours 09/21/16 1616     09/20/16 0430  piperacillin-tazobactam (ZOSYN) IVPB 3.375 g  Status:  Discontinued     3.375 g 12.5 mL/hr over 240 Minutes Intravenous Every 8 hours 09/19/16 1954 09/21/16 1616   09/19/16 2000  piperacillin-tazobactam (ZOSYN) IVPB 3.375 g     3.375 g 100 mL/hr over 30 Minutes Intravenous  Once 09/19/16 1953 09/19/16 2100      Assessment/Plan: Cholecystitis and responded to medical therapy. We'll switch to by mouth Augmentin and DC Zosyn. We'll advance diet slowly likely discharge in the morning. no need for surgical intervention. Allergies discussion with the patient and the family about the role for cholecystectomy given that she is 83 year old I'm not sure if she will be a good surgical candidate. I want to wait and may be reevaluated in the outpatient setting to see if her overall condition improves. Please note that I spent 35 minutes in this encounter with at least 50% of the time spent in counseling and coordination of her care  Caroleen Hamman, MD, [facility]  09/21/2016

## 2016-09-22 LAB — BASIC METABOLIC PANEL
ANION GAP: 5 (ref 5–15)
BUN: 12 mg/dL (ref 6–20)
CALCIUM: 9.2 mg/dL (ref 8.9–10.3)
CO2: 27 mmol/L (ref 22–32)
Chloride: 108 mmol/L (ref 101–111)
Creatinine, Ser: 0.56 mg/dL (ref 0.44–1.00)
GFR calc Af Amer: >60 mL/min (ref >60)
GLUCOSE: 124 mg/dL — AB (ref 65–99)
POTASSIUM: 3.5 mmol/L (ref 3.5–5.1)
SODIUM: 140 mmol/L (ref 135–145)

## 2016-09-22 LAB — CBC
HCT: 40.3 % (ref 35.0–47.0)
Hemoglobin: 13.9 g/dL (ref 12.0–16.0)
MCH: 31.1 pg (ref 26.0–34.0)
MCHC: 34.3 g/dL (ref 32.0–36.0)
MCV: 90.7 fL (ref 80.0–100.0)
PLATELETS: 148 10*3/uL — AB (ref 150–440)
RBC: 4.45 MIL/uL (ref 3.80–5.20)
RDW: 13.2 % (ref 11.5–14.5)
WBC: 5.5 10*3/uL (ref 3.6–11.0)

## 2016-09-22 MED ORDER — PANTOPRAZOLE SODIUM 40 MG PO TBEC: 40.0000 mg | DELAYED_RELEASE_TABLET | Freq: Every day | ORAL | Status: DC

## 2016-09-22 MED ORDER — AMOXICILLIN-POT CLAVULANATE 875-125 MG PO TABS: 1.0000 | ORAL_TABLET | Freq: Two times a day (BID) | ORAL | 0 refills | Status: AC

## 2016-09-22 MED ORDER — AMOXICILLIN-POT CLAVULANATE 875-125 MG PO TABS: 1.0000 | ORAL_TABLET | Freq: Two times a day (BID) | ORAL | 0 refills | Status: DC

## 2016-09-22 NOTE — Care Management Important Message (Signed)
Important Message  Patient Details  Name: Jennifer Murray MRN: [id number] Date of Birth: 07/24/1927   Medicare Important Message Given:  Yes    Beverly Sessions, RN 09/22/2016, 10:24 AM

## 2016-09-22 NOTE — Progress Notes (Signed)
Discharge order received. Patient is alert and oriented. Vital signs stable . No signs of acute distress. Discharge instructions given. Patient verbalized understanding. No other issues noted at this time. Transported by RN to the car.

## 2016-09-22 NOTE — Care Management (Signed)
Patient admitted from home with acute cholecystitis.  Patient was treated with IV antibiotics, and will discharge on oral antibiotics.  Patient lives at home with grandson, however he will be out of town next week.  Patient also has private duty PCS, but their caregiver will also be out of town next week as well.  Adult daughter lives locally for support.  Patient has WC, BSC, and RW in the home.  Home health orders have been placed for PT and RN.  Patient has had [facility], and would like to use them again.  Malachy Mood with [employer] notified of referral.  RNCM signing off.

## 2016-09-22 NOTE — Progress Notes (Addendum)
SURGICAL PROGRESS NOTE (cpt [phone])  Hospital Day(s): 3.   Post op day(s):  Marland Kitchen   Interval History: Patient seen and examined, tolerating regular diet with no acute events or new complaints overnight. Patient reports ongoing improvement of Right flank / RUQ abdominal pain, denies heartburn, N/V, fever/chills, CP, or SOB.  Review of Systems:  Constitutional: denies fever, chills  HEENT: denies cough or congestion  Respiratory: denies any shortness of breath  Cardiovascular: denies chest pain or palpitations  Gastrointestinal: abdominal pain, N/V, and bowel function as per interval history Genitourinary: denies burning with urination or urinary frequency Musculoskeletal: denies pain, decreased motor or sensation Integumentary: denies any other rashes or skin discolorations Neurological: denies HA or vision/hearing changes   Vital signs in last 24 hours: [min-max] current  Temp:  [97.6 F (36.4 C)-97.8 F (36.6 C)] 97.7 F (36.5 C) (04/30 0420) Pulse Rate:  [64-89] 64 (04/30 0420) Resp:  [16-18] 16 (04/30 0420) BP: (108-125)/(63-97) 108/63 (04/30 0420) SpO2:  [97 %-99 %] 97 % (04/30 0420)     Height: 5\' 6"  (167.6 cm) Weight: 216 lb 4.8 oz (98.1 kg) BMI (Calculated): 35   Intake/Output this shift:  No intake/output data recorded.   Intake/Output last 2 shifts:  @IOLAST2SHIFTS @   Physical Exam:  Constitutional: alert, cooperative and no distress  HENT: normocephalic without obvious abnormality  Eyes: PERRL, EOM's grossly intact and symmetric  Neuro: CN II - XII grossly intact and symmetric without deficit  Respiratory: breathing non-labored at rest  Cardiovascular: regular rate and sinus rhythm  Gastrointestinal: soft, non-tender, and non-distended with minimal Right flank pain  Musculoskeletal: UE and LE FROM, no edema or wounds, motor and sensation grossly intact, NT   Labs:  CBC Latest Ref Rng & Units 09/22/2016 09/20/2016 09/19/2016  WBC 3.6 - 11.0 K/uL 5.5 5.8 6.4   Hemoglobin 12.0 - 16.0 g/dL 13.9 13.2 14.5  Hematocrit 35.0 - 47.0 % 40.3 38.2 42.9  Platelets 150 - 440 K/uL 148(L) 149(L) 170   CMP Latest Ref Rng & Units 09/22/2016 09/20/2016 09/19/2016  Glucose 65 - 99 mg/dL 124(H) 114(H) 181(H)  BUN 6 - 20 mg/dL 12 16 17   Creatinine 0.44 - 1.00 mg/dL 0.56 0.59 0.57  Sodium 135 - 145 mmol/L 140 140 139  Potassium 3.5 - 5.1 mmol/L 3.5 3.5 3.5  Chloride 101 - 111 mmol/L 108 109 109  CO2 22 - 32 mmol/L 27 27 25   Calcium 8.9 - 10.3 mg/dL 9.2 9.1 9.7  Total Protein 6.5 - 8.1 g/dL - - 6.2(L)  Total Bilirubin 0.3 - 1.2 mg/dL - - 0.8  Alkaline Phos 38 - 126 U/L - - 83  AST 15 - 41 U/L - - 30  ALT 14 - 54 U/L - - 15    Imaging studies: No new pertinent imaging studies   Assessment/Plan: (ICD-10's: K81.0) 83 y.o. female with improved Right flank / upper quadrant abdominal pain attributed to acute cholecystitis with borderline gallbladder wall thickening and cholelithiasis on pre-admission RUQ abdominal ultrasound, for which patient has been treated with antibiotics x 3.5 days, preceded 1 week earlier by emesis x 1-2 and a single loose BM without any history of heartburn, complicated by general debilitation and pertinent comorbidities including advanced age, obesity (BMI 35), multiple prior TIA's for which patient remains on daily Plavix (no longer with aspirin), chronic back pain/lumbar spinal stenosis, and osteoarthritis.   - regular diet, though minimize fatty foods (meats, cheeses/dairy, and fried foods)   - no indication for surgical intervention at  this time, will plan for discharge home today   - discussed with patient and her daughter diagnosis for which she is being treated and differential diagnoses  - complete 7 day course of antibiotics as currently ordered (3.5 more days of Augmentin)  - follow-up in surgery office in 2 weeks to assess recovery without recurrence  All of the above findings and recommendations were discussed with the patient,  patient's daughter, and all of patient's and family's questions were answered to their expressed satisfaction.  -- [name], MD, [place]: [facility] - Partnering for exceptional care. Office: [phone]

## 2016-09-22 NOTE — Progress Notes (Signed)
PHARMACIST - PHYSICIAN COMMUNICATION  CONCERNING: IV to Oral Route Change Policy  RECOMMENDATION: This patient is receiving PROTONIX by the intravenous route.  Based on criteria approved by the Pharmacy and Therapeutics Committee, the intravenous medication(s) is/are being converted to the equivalent oral dose form(s).   DESCRIPTION: These criteria include:  The patient is eating (either orally or via tube) and/or has been taking other orally administered medications for a least 24 hours  The patient has no evidence of active gastrointestinal bleeding or impaired GI absorption (gastrectomy, short bowel, patient on TNA or NPO).  If you have questions about this conversion, please contact the Pharmacy Department  []   [phone] )  Jennifer Murray [x]   [phone] )  Jennifer Murray []   [phone] )  Jennifer Murray []   [phone] )  Jennifer Murray []   [phone] )  Jennifer Murray, Jennifer Murray 09/22/2016 2:42 PM

## 2016-09-22 NOTE — Discharge Instructions (Signed)
In addition to included general instructions for cholecystitis and abdominal pain,  Diet: Resume Low-Fat home heart healthy diet.   Activity: No specific restrictions, but light activity and walking are encouraged.  Medications: Resume all home medications.  Call office [phone]) at any time if worsening abdominal pain, nausea / vomiting, fevers / chills, or any other associated questions or concerns.

## 2016-09-23 DIAGNOSIS — E669 Obesity, unspecified: Secondary | ICD-10-CM | POA: Diagnosis not present

## 2016-09-23 DIAGNOSIS — K810 Acute cholecystitis: Secondary | ICD-10-CM | POA: Diagnosis not present

## 2016-09-23 DIAGNOSIS — R531 Weakness: Secondary | ICD-10-CM | POA: Diagnosis not present

## 2016-09-23 DIAGNOSIS — M48061 Spinal stenosis, lumbar region without neurogenic claudication: Secondary | ICD-10-CM | POA: Diagnosis not present

## 2016-09-23 DIAGNOSIS — K5790 Diverticulosis of intestine, part unspecified, without perforation or abscess without bleeding: Secondary | ICD-10-CM | POA: Diagnosis not present

## 2016-09-23 DIAGNOSIS — M1991 Primary osteoarthritis, unspecified site: Secondary | ICD-10-CM | POA: Diagnosis not present

## 2016-09-23 DIAGNOSIS — M810 Age-related osteoporosis without current pathological fracture: Secondary | ICD-10-CM | POA: Diagnosis not present

## 2016-09-23 DIAGNOSIS — I10 Essential (primary) hypertension: Secondary | ICD-10-CM | POA: Diagnosis not present

## 2016-09-23 DIAGNOSIS — Z8673 Personal history of transient ischemic attack (TIA), and cerebral infarction without residual deficits: Secondary | ICD-10-CM | POA: Diagnosis not present

## 2016-09-24 DIAGNOSIS — I10 Essential (primary) hypertension: Secondary | ICD-10-CM | POA: Diagnosis not present

## 2016-09-24 DIAGNOSIS — M48061 Spinal stenosis, lumbar region without neurogenic claudication: Secondary | ICD-10-CM | POA: Diagnosis not present

## 2016-09-24 DIAGNOSIS — K5790 Diverticulosis of intestine, part unspecified, without perforation or abscess without bleeding: Secondary | ICD-10-CM | POA: Diagnosis not present

## 2016-09-24 DIAGNOSIS — R531 Weakness: Secondary | ICD-10-CM | POA: Diagnosis not present

## 2016-09-24 DIAGNOSIS — M1991 Primary osteoarthritis, unspecified site: Secondary | ICD-10-CM | POA: Diagnosis not present

## 2016-09-24 DIAGNOSIS — K810 Acute cholecystitis: Secondary | ICD-10-CM | POA: Diagnosis not present

## 2016-09-26 DIAGNOSIS — R531 Weakness: Secondary | ICD-10-CM | POA: Diagnosis not present

## 2016-09-26 DIAGNOSIS — M48061 Spinal stenosis, lumbar region without neurogenic claudication: Secondary | ICD-10-CM | POA: Diagnosis not present

## 2016-09-26 DIAGNOSIS — K810 Acute cholecystitis: Secondary | ICD-10-CM | POA: Diagnosis not present

## 2016-09-26 DIAGNOSIS — I10 Essential (primary) hypertension: Secondary | ICD-10-CM | POA: Diagnosis not present

## 2016-09-26 DIAGNOSIS — K5790 Diverticulosis of intestine, part unspecified, without perforation or abscess without bleeding: Secondary | ICD-10-CM | POA: Diagnosis not present

## 2016-09-26 DIAGNOSIS — M1991 Primary osteoarthritis, unspecified site: Secondary | ICD-10-CM | POA: Diagnosis not present

## 2016-09-30 ENCOUNTER — Encounter: Payer: Self-pay | Admitting: Surgery

## 2016-09-30 ENCOUNTER — Ambulatory Visit (INDEPENDENT_AMBULATORY_CARE_PROVIDER_SITE_OTHER): Payer: Medicare Other | Admitting: Surgery

## 2016-09-30 VITALS — BP 119/71 | HR 80 | Temp 98.5°F | Ht 66.0 in | Wt 216.5 lb

## 2016-09-30 DIAGNOSIS — C801 Malignant (primary) neoplasm, unspecified: Secondary | ICD-10-CM | POA: Insufficient documentation

## 2016-09-30 DIAGNOSIS — R531 Weakness: Secondary | ICD-10-CM | POA: Diagnosis not present

## 2016-09-30 DIAGNOSIS — K5790 Diverticulosis of intestine, part unspecified, without perforation or abscess without bleeding: Secondary | ICD-10-CM | POA: Diagnosis not present

## 2016-09-30 DIAGNOSIS — I10 Essential (primary) hypertension: Secondary | ICD-10-CM | POA: Diagnosis not present

## 2016-09-30 DIAGNOSIS — M1991 Primary osteoarthritis, unspecified site: Secondary | ICD-10-CM | POA: Diagnosis not present

## 2016-09-30 DIAGNOSIS — M1990 Unspecified osteoarthritis, unspecified site: Secondary | ICD-10-CM | POA: Insufficient documentation

## 2016-09-30 DIAGNOSIS — E669 Obesity, unspecified: Secondary | ICD-10-CM | POA: Insufficient documentation

## 2016-09-30 DIAGNOSIS — G458 Other transient cerebral ischemic attacks and related syndromes: Secondary | ICD-10-CM | POA: Insufficient documentation

## 2016-09-30 DIAGNOSIS — M810 Age-related osteoporosis without current pathological fracture: Secondary | ICD-10-CM | POA: Insufficient documentation

## 2016-09-30 DIAGNOSIS — M48061 Spinal stenosis, lumbar region without neurogenic claudication: Secondary | ICD-10-CM | POA: Diagnosis not present

## 2016-09-30 DIAGNOSIS — K810 Acute cholecystitis: Secondary | ICD-10-CM

## 2016-09-30 NOTE — Patient Instructions (Addendum)
Please call our office with any questions or concerns that you may have.    Low-Fat Diet for Pancreatitis or Gallbladder Conditions A low-fat diet can be helpful if you have pancreatitis or a gallbladder condition. With these conditions, your pancreas and gallbladder have trouble digesting fats. A healthy eating plan with less fat will help rest your pancreas and gallbladder and reduce your symptoms. What do I need to know about this diet?  Eat a low-fat diet.  Reduce your fat intake to less than 20-30% of your total daily calories. This is less than 50-60 g of fat per day.  Remember that you need some fat in your diet. Ask your dietician what your daily goal should be.  Choose nonfat and low-fat healthy foods. Look for the words "nonfat," "low fat," or "fat free."  As a guide, look on the label and choose foods with less than 3 g of fat per serving. Eat only one serving.  Avoid alcohol.  Do not smoke. If you need help quitting, talk with your health care provider.  Eat small frequent meals instead of three large heavy meals. What foods can I eat? Grains  Include healthy grains and starches such as potatoes, wheat bread, fiber-rich cereal, and brown rice. Choose whole grain options whenever possible. In adults, whole grains should account for 45-65% of your daily calories. Fruits and Vegetables  Eat plenty of fruits and vegetables. Fresh fruits and vegetables add fiber to your diet. Meats and Other Protein Sources  Eat lean meat such as chicken and pork. Trim any fat off of meat before cooking it. Eggs, fish, and beans are other sources of protein. In adults, these foods should account for 10-35% of your daily calories. Dairy  Choose low-fat milk and dairy options. Dairy includes fat and protein, as well as calcium. Fats and Oils  Limit high-fat foods such as fried foods, sweets, baked goods, sugary drinks. Other  Creamy sauces and condiments, such as mayonnaise, can add extra fat.  Think about whether or not you need to use them, or use smaller amounts or low fat options. What foods are not recommended?  High fat foods, such as:  [employer].  Ice cream.  [place] toast.  Sweet rolls.  Pizza.  Cheese bread.  Foods covered with batter, butter, creamy sauces, or cheese.  Fried foods.  Sugary drinks and desserts.  Foods that cause gas or bloating This information is not intended to replace advice given to you by your health care provider. Make sure you discuss any questions you have with your health care provider. Document Released: 05/17/2013 Document Revised: 10/18/2015 Document Reviewed: 04/25/2013 Elsevier Interactive Patient Education  2017 [employer].

## 2016-09-30 NOTE — Progress Notes (Signed)
09/30/2016  History of Present Illness: Jennifer Murray is a 83 y.o. female admitted on 4/27 to 4/30 with possible acute cholecystitis.  Was discharged with course of antibiotics and presents for follow up.  Reports that she had one episode of low grade pain in the RUQ since discharge but otherwise no other issues.  Denies any fevers, chills, nausea, vomiting.  Has been trying to do low fat diet, but is uncertain of what foods are in that category.    Past Medical History: Past Medical History:  Diagnosis Date  . Acute cerebrovascular insufficiency transient focal neurologic deficit 2003  . Arthritis   . Cancer (HCC)    Basal Cell  . Diverticulosis   . Leg edema   . Lumbar spinal stenosis   . Obesity   . Osteoporosis   . Stroke [facility])      Past Surgical History: Past Surgical History:  Procedure Laterality Date  . BREAST BIOPSY Left 1975   Benign per patient  . COLONOSCOPY W/ POLYPECTOMY    . EYE SURGERY Bilateral 2016   cataract  . JOINT REPLACEMENT Left 2011   knee    Home Medications: Prior to Admission medications   Medication Sig Start Date End Date Taking? Authorizing Provider  B Complex Vitamins (B COMPLEX PO) Take 1 tablet by mouth daily.    Yes [provider]  clopidogrel (PLAVIX) 75 MG tablet Take 1 tablet (75 mg total) by mouth daily. 02/03/16  Yes Crissman, Jeannette How, MD  clotrimazole-betamethasone (LOTRISONE) cream Apply 1 application topically 2 (two) times daily. 04/02/16  Yes Crissman, Jeannette How, MD  Multiple Vitamins-Minerals (CENTRUM SILVER ADULT 50+ PO) Take 1 tablet by mouth daily.    Yes [provider]  Omega-3 Fatty Acids (FISH OIL BURP-LESS PO) Take 1 capsule by mouth daily.    Yes [provider]  vitamin B-12 (CYANOCOBALAMIN) 1000 MCG tablet Take 1,000 mcg by mouth once.   Yes [provider]    Allergies: Allergies  Allergen Reactions  . Ciprocin-Fluocin-Procin [Fluocinolone Acetonide] Other (See Comments)    dizziness   . Ciprofloxacin Other (See Comments)    Dizziness.    Review of Systems: Review of Systems  Constitutional: Negative for chills and fever.  Respiratory: Negative for cough.   Cardiovascular: Negative for chest pain.  Gastrointestinal: Negative for abdominal pain, nausea and vomiting.  Genitourinary: Negative for dysuria.  Musculoskeletal: Negative for myalgias.  Skin: Negative for rash.  Neurological: Negative for dizziness.  Psychiatric/Behavioral: Negative for depression.  All other systems reviewed and are negative.   Physical Exam BP 119/71   Pulse 80   Temp 98.5 F (36.9 C) (Oral)   Ht 5\' 6"  (1.676 m)   Wt 98.2 kg (216 lb 8 oz)   BMI 34.94 kg/m  CONSTITUTIONAL: No acute distress RESPIRATORY:  Lungs are clear, and breath sounds are equal bilaterally. Normal respiratory effort without pathologic use of accessory muscles. CARDIOVASCULAR: Heart is regular without murmurs, gallops, or rubs. GI: The abdomen is soft, obese, non-distended, non-tender to palpation.  Negative Murphy's sign. PSYCH:  Alert and oriented to person, place and time. Affect is normal.  Labs/Imaging: None  Assessment and Plan: [name] with a history of acute cholecystitis, treated conservatively with antibiotics.  Suspected cholecystitis though no impressive evidence on ultrasound, but the patient did have a positive Murphy's sign on admission.    At this point, it is not 702% certain that she had cholecystitis, though she did have  a positive Murphy's sign on admission, and she responded well to antibiotic management.  She has not had the same type of pain and to that degree, and only had one episode of some mild discomfort.  No further nausea or vomiting either.  Discussed with the patient that at this point, given the uncertainty of diagnosis, would not recommend moving forward with cholecystectomy, plus the patient is on Plavix and not an ideal surgical candidate. The patient would  much rather not have to undergo any surgical procedure unless it's very necessary, and at this point, there is no urgency for it.  Recommended that the patient continue with a low fat diet and will give further instructions on what's included in this category.  The patient will also be seeing her PCP soon for follow up and will ask him about what the Plavix is for and if it's needed or if it can be changed to ASA 81 mg.  She can also take an antacid as a precaution in case this was more of gastritis / ulcer type of episode.  For now, will do watchful waiting.  There are no activity restrictions and the patient can go to her grandson's graduation in [place].    Patient may follow up on an as needed basis.  All of her questions have been answered.   Melvyn Neth, [place]

## 2016-09-30 NOTE — Discharge Summary (Signed)
Physician Discharge Summary  Patient ID: Jennifer Murray MRN: [id number] DOB/AGE: 1927-11-03 83 y.o.  Admit date: 09/19/2016 Discharge date: 09/30/2016  Admission Diagnoses:  Discharge Diagnoses:  Active Problems:   Acute cholecystitis   Discharged Condition: fair  Hospital Course: 83 y.o. obese debilitated female presented to [facility] ED with Right flank / upper quadrant abdominal pain preceded 1 week earlier by emesis x 1-2 and a single loose BM without any history of heartburn. RUQ abdominal ultrasound demonstrated cholecystitis with borderline gallbladder wall thickening and cholelithiasis. Patient was treated with antibiotics x 3.5 days, during which time her pain resolved, and she was able to tolerate PO diet and returned to baseline activity. Discharge planning was accordingly initiated, and patient was safely able to be discharged home with instructions including to complete prescribed course of antibiotics and with appropriate follow-up.  Consults: None  Significant Diagnostic Studies: radiology: Ultrasound: Acute calculous cholecystitis with borderline gallbladder wall thickening  Treatments: IV hydration and antibiotics: Zosyn  Discharge Exam: Blood pressure 108/63, pulse 64, temperature 97.7 F (36.5 C), temperature source Oral, resp. rate 16, height 5\' 6"  (1.676 m), weight 216 lb 4.8 oz (98.1 kg), SpO2 97 %. General appearance: alert, cooperative and no distress GI: soft, non-tender; bowel sounds normal; no masses,  no organomegaly  Disposition: 01-Home or Self Care   Allergies as of 09/22/2016      Reactions   Ciprocin-fluocin-procin [fluocinolone Acetonide] Other (See Comments)   dizziness   Ciprofloxacin Other (See Comments)   Dizziness.      Medication List    TAKE these medications   B COMPLEX PO Take 1 tablet by mouth daily.   CENTRUM SILVER ADULT 50+ PO Take 1 tablet by mouth daily.   cetirizine 10 MG tablet Commonly known as:  ZYRTEC Take 1 tablet (10 mg  total) by mouth daily.   clopidogrel 75 MG tablet Commonly known as:  PLAVIX Take 1 tablet (75 mg total) by mouth daily.   clotrimazole-betamethasone cream Commonly known as:  LOTRISONE Apply 1 application topically 2 (two) times daily.   FISH OIL BURP-LESS PO Take 1 capsule by mouth daily.     ASK your doctor about these medications   amoxicillin-clavulanate 875-125 MG tablet Commonly known as:  AUGMENTIN Take 1 tablet by mouth every 12 (twelve) hours. Ask about: Should I take this medication?      Follow-up Information    Olean Ree, MD. Go on 10/01/2016.   Specialty:  Surgery Why:  @2 :30pm Contact information: [place] [address] [phone]           Signed: Vickie Epley 09/30/2016, 12:05 PM

## 2016-10-01 ENCOUNTER — Inpatient Hospital Stay: Payer: Medicare Other | Admitting: Surgery

## 2016-10-01 DIAGNOSIS — I10 Essential (primary) hypertension: Secondary | ICD-10-CM | POA: Diagnosis not present

## 2016-10-01 DIAGNOSIS — R531 Weakness: Secondary | ICD-10-CM | POA: Diagnosis not present

## 2016-10-01 DIAGNOSIS — M1991 Primary osteoarthritis, unspecified site: Secondary | ICD-10-CM | POA: Diagnosis not present

## 2016-10-01 DIAGNOSIS — M48061 Spinal stenosis, lumbar region without neurogenic claudication: Secondary | ICD-10-CM | POA: Diagnosis not present

## 2016-10-01 DIAGNOSIS — K810 Acute cholecystitis: Secondary | ICD-10-CM | POA: Diagnosis not present

## 2016-10-01 DIAGNOSIS — K5790 Diverticulosis of intestine, part unspecified, without perforation or abscess without bleeding: Secondary | ICD-10-CM | POA: Diagnosis not present

## 2016-10-02 ENCOUNTER — Encounter: Payer: Medicare Other | Admitting: Family Medicine

## 2016-10-03 DIAGNOSIS — R531 Weakness: Secondary | ICD-10-CM | POA: Diagnosis not present

## 2016-10-03 DIAGNOSIS — M1991 Primary osteoarthritis, unspecified site: Secondary | ICD-10-CM | POA: Diagnosis not present

## 2016-10-03 DIAGNOSIS — K810 Acute cholecystitis: Secondary | ICD-10-CM | POA: Diagnosis not present

## 2016-10-03 DIAGNOSIS — M48061 Spinal stenosis, lumbar region without neurogenic claudication: Secondary | ICD-10-CM | POA: Diagnosis not present

## 2016-10-03 DIAGNOSIS — K5790 Diverticulosis of intestine, part unspecified, without perforation or abscess without bleeding: Secondary | ICD-10-CM | POA: Diagnosis not present

## 2016-10-03 DIAGNOSIS — I10 Essential (primary) hypertension: Secondary | ICD-10-CM | POA: Diagnosis not present

## 2016-10-06 DIAGNOSIS — M48061 Spinal stenosis, lumbar region without neurogenic claudication: Secondary | ICD-10-CM | POA: Diagnosis not present

## 2016-10-06 DIAGNOSIS — K810 Acute cholecystitis: Secondary | ICD-10-CM | POA: Diagnosis not present

## 2016-10-06 DIAGNOSIS — R531 Weakness: Secondary | ICD-10-CM | POA: Diagnosis not present

## 2016-10-06 DIAGNOSIS — K5790 Diverticulosis of intestine, part unspecified, without perforation or abscess without bleeding: Secondary | ICD-10-CM | POA: Diagnosis not present

## 2016-10-06 DIAGNOSIS — I10 Essential (primary) hypertension: Secondary | ICD-10-CM | POA: Diagnosis not present

## 2016-10-06 DIAGNOSIS — M1991 Primary osteoarthritis, unspecified site: Secondary | ICD-10-CM | POA: Diagnosis not present

## 2016-10-08 DIAGNOSIS — K5790 Diverticulosis of intestine, part unspecified, without perforation or abscess without bleeding: Secondary | ICD-10-CM | POA: Diagnosis not present

## 2016-10-08 DIAGNOSIS — R531 Weakness: Secondary | ICD-10-CM | POA: Diagnosis not present

## 2016-10-08 DIAGNOSIS — K810 Acute cholecystitis: Secondary | ICD-10-CM | POA: Diagnosis not present

## 2016-10-08 DIAGNOSIS — I10 Essential (primary) hypertension: Secondary | ICD-10-CM | POA: Diagnosis not present

## 2016-10-08 DIAGNOSIS — M48061 Spinal stenosis, lumbar region without neurogenic claudication: Secondary | ICD-10-CM | POA: Diagnosis not present

## 2016-10-08 DIAGNOSIS — M1991 Primary osteoarthritis, unspecified site: Secondary | ICD-10-CM | POA: Diagnosis not present

## 2016-10-09 DIAGNOSIS — K5790 Diverticulosis of intestine, part unspecified, without perforation or abscess without bleeding: Secondary | ICD-10-CM | POA: Diagnosis not present

## 2016-10-09 DIAGNOSIS — K810 Acute cholecystitis: Secondary | ICD-10-CM | POA: Diagnosis not present

## 2016-10-09 DIAGNOSIS — R531 Weakness: Secondary | ICD-10-CM | POA: Diagnosis not present

## 2016-10-09 DIAGNOSIS — M48061 Spinal stenosis, lumbar region without neurogenic claudication: Secondary | ICD-10-CM | POA: Diagnosis not present

## 2016-10-09 DIAGNOSIS — I10 Essential (primary) hypertension: Secondary | ICD-10-CM | POA: Diagnosis not present

## 2016-10-09 DIAGNOSIS — M1991 Primary osteoarthritis, unspecified site: Secondary | ICD-10-CM | POA: Diagnosis not present

## 2016-10-13 DIAGNOSIS — M48061 Spinal stenosis, lumbar region without neurogenic claudication: Secondary | ICD-10-CM | POA: Diagnosis not present

## 2016-10-13 DIAGNOSIS — K5790 Diverticulosis of intestine, part unspecified, without perforation or abscess without bleeding: Secondary | ICD-10-CM | POA: Diagnosis not present

## 2016-10-13 DIAGNOSIS — K810 Acute cholecystitis: Secondary | ICD-10-CM | POA: Diagnosis not present

## 2016-10-13 DIAGNOSIS — I10 Essential (primary) hypertension: Secondary | ICD-10-CM | POA: Diagnosis not present

## 2016-10-13 DIAGNOSIS — M1991 Primary osteoarthritis, unspecified site: Secondary | ICD-10-CM | POA: Diagnosis not present

## 2016-10-13 DIAGNOSIS — R531 Weakness: Secondary | ICD-10-CM | POA: Diagnosis not present

## 2016-10-15 NOTE — ED Notes (Signed)
First call report Rosann Auerbach, RN to call this RN back for report

## 2016-10-15 NOTE — ED Notes (Signed)
Pt given meal tray, call to secretary for room ready delay, pt and family updated

## 2016-10-16 DIAGNOSIS — K810 Acute cholecystitis: Secondary | ICD-10-CM | POA: Diagnosis not present

## 2016-10-16 DIAGNOSIS — R531 Weakness: Secondary | ICD-10-CM | POA: Diagnosis not present

## 2016-10-16 DIAGNOSIS — I10 Essential (primary) hypertension: Secondary | ICD-10-CM | POA: Diagnosis not present

## 2016-10-16 DIAGNOSIS — M48061 Spinal stenosis, lumbar region without neurogenic claudication: Secondary | ICD-10-CM | POA: Diagnosis not present

## 2016-10-16 DIAGNOSIS — M1991 Primary osteoarthritis, unspecified site: Secondary | ICD-10-CM | POA: Diagnosis not present

## 2016-10-16 DIAGNOSIS — K5790 Diverticulosis of intestine, part unspecified, without perforation or abscess without bleeding: Secondary | ICD-10-CM | POA: Diagnosis not present

## 2016-10-22 DIAGNOSIS — I10 Essential (primary) hypertension: Secondary | ICD-10-CM | POA: Diagnosis not present

## 2016-10-22 DIAGNOSIS — K5790 Diverticulosis of intestine, part unspecified, without perforation or abscess without bleeding: Secondary | ICD-10-CM | POA: Diagnosis not present

## 2016-10-22 DIAGNOSIS — M1991 Primary osteoarthritis, unspecified site: Secondary | ICD-10-CM | POA: Diagnosis not present

## 2016-10-22 DIAGNOSIS — R531 Weakness: Secondary | ICD-10-CM | POA: Diagnosis not present

## 2016-10-22 DIAGNOSIS — K810 Acute cholecystitis: Secondary | ICD-10-CM | POA: Diagnosis not present

## 2016-10-22 DIAGNOSIS — M48061 Spinal stenosis, lumbar region without neurogenic claudication: Secondary | ICD-10-CM | POA: Diagnosis not present

## 2016-10-24 DIAGNOSIS — M48061 Spinal stenosis, lumbar region without neurogenic claudication: Secondary | ICD-10-CM | POA: Diagnosis not present

## 2016-10-24 DIAGNOSIS — K5790 Diverticulosis of intestine, part unspecified, without perforation or abscess without bleeding: Secondary | ICD-10-CM | POA: Diagnosis not present

## 2016-10-24 DIAGNOSIS — I10 Essential (primary) hypertension: Secondary | ICD-10-CM | POA: Diagnosis not present

## 2016-10-24 DIAGNOSIS — K810 Acute cholecystitis: Secondary | ICD-10-CM | POA: Diagnosis not present

## 2016-10-24 DIAGNOSIS — M1991 Primary osteoarthritis, unspecified site: Secondary | ICD-10-CM | POA: Diagnosis not present

## 2016-10-24 DIAGNOSIS — R531 Weakness: Secondary | ICD-10-CM | POA: Diagnosis not present

## 2016-10-29 DIAGNOSIS — R531 Weakness: Secondary | ICD-10-CM | POA: Diagnosis not present

## 2016-10-29 DIAGNOSIS — K810 Acute cholecystitis: Secondary | ICD-10-CM | POA: Diagnosis not present

## 2016-10-29 DIAGNOSIS — I10 Essential (primary) hypertension: Secondary | ICD-10-CM | POA: Diagnosis not present

## 2016-10-29 DIAGNOSIS — M48061 Spinal stenosis, lumbar region without neurogenic claudication: Secondary | ICD-10-CM | POA: Diagnosis not present

## 2016-10-29 DIAGNOSIS — K5790 Diverticulosis of intestine, part unspecified, without perforation or abscess without bleeding: Secondary | ICD-10-CM | POA: Diagnosis not present

## 2016-10-29 DIAGNOSIS — M1991 Primary osteoarthritis, unspecified site: Secondary | ICD-10-CM | POA: Diagnosis not present

## 2016-10-31 DIAGNOSIS — K5790 Diverticulosis of intestine, part unspecified, without perforation or abscess without bleeding: Secondary | ICD-10-CM | POA: Diagnosis not present

## 2016-10-31 DIAGNOSIS — K810 Acute cholecystitis: Secondary | ICD-10-CM | POA: Diagnosis not present

## 2016-10-31 DIAGNOSIS — R531 Weakness: Secondary | ICD-10-CM | POA: Diagnosis not present

## 2016-10-31 DIAGNOSIS — I10 Essential (primary) hypertension: Secondary | ICD-10-CM | POA: Diagnosis not present

## 2016-10-31 DIAGNOSIS — M1991 Primary osteoarthritis, unspecified site: Secondary | ICD-10-CM | POA: Diagnosis not present

## 2016-10-31 DIAGNOSIS — M48061 Spinal stenosis, lumbar region without neurogenic claudication: Secondary | ICD-10-CM | POA: Diagnosis not present

## 2016-11-03 DIAGNOSIS — I10 Essential (primary) hypertension: Secondary | ICD-10-CM | POA: Diagnosis not present

## 2016-11-03 DIAGNOSIS — K5790 Diverticulosis of intestine, part unspecified, without perforation or abscess without bleeding: Secondary | ICD-10-CM | POA: Diagnosis not present

## 2016-11-03 DIAGNOSIS — M1991 Primary osteoarthritis, unspecified site: Secondary | ICD-10-CM | POA: Diagnosis not present

## 2016-11-03 DIAGNOSIS — K810 Acute cholecystitis: Secondary | ICD-10-CM | POA: Diagnosis not present

## 2016-11-03 DIAGNOSIS — M48061 Spinal stenosis, lumbar region without neurogenic claudication: Secondary | ICD-10-CM | POA: Diagnosis not present

## 2016-11-03 DIAGNOSIS — R531 Weakness: Secondary | ICD-10-CM | POA: Diagnosis not present

## 2016-11-04 ENCOUNTER — Encounter: Payer: Self-pay | Admitting: Family Medicine

## 2016-11-04 ENCOUNTER — Ambulatory Visit (INDEPENDENT_AMBULATORY_CARE_PROVIDER_SITE_OTHER): Payer: Medicare Other | Admitting: Family Medicine

## 2016-11-04 VITALS — BP 120/68 | HR 80

## 2016-11-04 DIAGNOSIS — I872 Venous insufficiency (chronic) (peripheral): Secondary | ICD-10-CM | POA: Diagnosis not present

## 2016-11-04 DIAGNOSIS — Z7189 Other specified counseling: Secondary | ICD-10-CM

## 2016-11-04 DIAGNOSIS — I10 Essential (primary) hypertension: Secondary | ICD-10-CM | POA: Diagnosis not present

## 2016-11-04 DIAGNOSIS — G458 Other transient cerebral ischemic attacks and related syndromes: Secondary | ICD-10-CM

## 2016-11-04 DIAGNOSIS — Z1329 Encounter for screening for other suspected endocrine disorder: Secondary | ICD-10-CM | POA: Diagnosis not present

## 2016-11-04 DIAGNOSIS — Z131 Encounter for screening for diabetes mellitus: Secondary | ICD-10-CM | POA: Diagnosis not present

## 2016-11-04 DIAGNOSIS — D692 Other nonthrombocytopenic purpura: Secondary | ICD-10-CM | POA: Insufficient documentation

## 2016-11-04 DIAGNOSIS — Z0000 Encounter for general adult medical examination without abnormal findings: Secondary | ICD-10-CM

## 2016-11-04 DIAGNOSIS — Z13220 Encounter for screening for lipoid disorders: Secondary | ICD-10-CM

## 2016-11-04 MED ORDER — CLOPIDOGREL BISULFATE 75 MG PO TABS: 75.0000 mg | ORAL_TABLET | Freq: Every day | ORAL | 4 refills | Status: AC

## 2016-11-04 NOTE — Progress Notes (Signed)
BP 120/68   Pulse 80   SpO2 97%    Subjective:    Patient ID: Jennifer Murray, female    DOB: 24-Mar-1928, 83 y.o.   MRN: [id number]  HPI: Jennifer Murray is a 83 y.o. female  Chief Complaint  Patient presents with  . Annual Exam  Patient accompanied by daughter and granddaughter. Who assists with history. Reviewed patient's hospitalization and blood work done at [facility] which was essentially normal patient has recovered with no further gallstone-like symptoms. Reviewed with patient low fat diet and importance for preventing future gallbladder problems if possible.  Also reviewed Plavix and aspirin indications patient's had TIA years ago hasn't had any further spells does have some bruising on her arms but otherwise does well.  Has vascular issues of purple changes of her feet that goes away with elevation or compression. 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  Relevant past medical, surgical, family and social history reviewed and updated as indicated. Interim medical history since our last visit reviewed. Allergies and medications reviewed and updated.  Review of Systems  Constitutional: Negative.   HENT: Negative.   Eyes: Negative.   Respiratory: Negative.   Cardiovascular: Negative.   Gastrointestinal: Negative.   Endocrine: Negative.   Genitourinary: Negative.   Musculoskeletal: Negative.   Skin: Negative.   Allergic/Immunologic: Negative.   Neurological: Negative.   Hematological: Negative.   Psychiatric/Behavioral: Negative.     Per HPI unless specifically indicated above     Objective:    BP 120/68   Pulse 80   SpO2 97%   Wt Readings from Last 3 Encounters:  09/30/16 216 lb 8 oz (98.2 kg)  09/19/16 216 lb 4.8 oz (98.1 kg)  10/01/15 240 lb (108.9 kg)    Physical Exam  Constitutional: She is oriented to person, place, and time. She appears well-developed and well-nourished.  HENT:  Head: Normocephalic and  atraumatic.  Right Ear: External ear normal.  Left Ear: External ear normal.  Nose: Nose normal.  Mouth/Throat: Oropharynx is clear and moist.  Eyes: Conjunctivae and EOM are normal. Pupils are equal, round, and reactive to light.  Neck: Normal range of motion. Neck supple. Carotid bruit is not present.  Cardiovascular: Normal rate, regular rhythm and normal heart sounds.   No murmur heard. Pulmonary/Chest: Effort normal and breath sounds normal. She exhibits no mass. Right breast exhibits no mass, no skin change and no tenderness. Left breast exhibits no mass, no skin change and no tenderness. Breasts are symmetrical.  Abdominal: Soft. Bowel sounds are normal. There is no hepatosplenomegaly.  Musculoskeletal: Normal range of motion.  Neurological: She is alert and oriented to person, place, and time.  Skin: No rash noted.  Psychiatric: She has a normal mood and affect. Her behavior is normal. Judgment and thought content normal.    Results for orders placed or performed during the hospital encounter of 09/19/16  Lipase, blood  Result Value Ref Range   Lipase 19 11 - 51 U/L  Comprehensive metabolic panel  Result Value Ref Range   Sodium 139 135 - 145 mmol/L   Potassium 3.5 3.5 - 5.1 mmol/L   Chloride 109 101 - 111 mmol/L   CO2 25 22 - 32 mmol/L   Glucose, Bld 181 (H) 65 - 99 mg/dL   BUN 17 6 - 20 mg/dL   Creatinine, Ser 0.57 0.44 - 1.00 mg/dL   Calcium 9.7 8.9 - 10.3 mg/dL   Total Protein 6.2 (  L) 6.5 - 8.1 g/dL   Albumin 3.6 3.5 - 5.0 g/dL   AST 30 15 - 41 U/L   ALT 15 14 - 54 U/L   Alkaline Phosphatase 83 38 - 126 U/L   Total Bilirubin 0.8 0.3 - 1.2 mg/dL   GFR calc non Af Amer >60 >60 mL/min   GFR calc Af Amer >60 >60 mL/min   Anion gap 5 5 - 15  CBC  Result Value Ref Range   WBC 6.4 3.6 - 11.0 K/uL   RBC 4.73 3.80 - 5.20 MIL/uL   Hemoglobin 14.5 12.0 - 16.0 g/dL   HCT 42.9 35.0 - 47.0 %   MCV 90.8 80.0 - 100.0 fL   MCH 30.7 26.0 - 34.0 pg   MCHC 33.8 32.0 - 36.0  g/dL   RDW 13.4 11.5 - 14.5 %   Platelets 170 150 - 440 K/uL  Urinalysis, Complete w Microscopic  Result Value Ref Range   Color, Urine AMBER (A) YELLOW   APPearance CLOUDY (A) CLEAR   Specific Gravity, Urine 1.018 1.005 - 1.030   pH 6.0 5.0 - 8.0   Glucose, UA NEGATIVE NEGATIVE mg/dL   Hgb urine dipstick NEGATIVE NEGATIVE   Bilirubin Urine NEGATIVE NEGATIVE   Ketones, ur NEGATIVE NEGATIVE mg/dL   Protein, ur NEGATIVE NEGATIVE mg/dL   Nitrite NEGATIVE NEGATIVE   Leukocytes, UA NEGATIVE NEGATIVE   RBC / HPF 0-5 0 - 5 RBC/hpf   WBC, UA 0-5 0 - 5 WBC/hpf   Bacteria, UA NONE SEEN NONE SEEN   Squamous Epithelial / LPF 0-5 (A) NONE SEEN   Mucous PRESENT   Basic metabolic panel  Result Value Ref Range   Sodium 140 135 - 145 mmol/L   Potassium 3.5 3.5 - 5.1 mmol/L   Chloride 109 101 - 111 mmol/L   CO2 27 22 - 32 mmol/L   Glucose, Bld 114 (H) 65 - 99 mg/dL   BUN 16 6 - 20 mg/dL   Creatinine, Ser 0.59 0.44 - 1.00 mg/dL   Calcium 9.1 8.9 - 10.3 mg/dL   GFR calc non Af Amer >60 >60 mL/min   GFR calc Af Amer >60 >60 mL/min   Anion gap 4 (L) 5 - 15  Magnesium  Result Value Ref Range   Magnesium 2.1 1.7 - 2.4 mg/dL  CBC WITH DIFFERENTIAL  Result Value Ref Range   WBC 5.8 3.6 - 11.0 K/uL   RBC 4.16 3.80 - 5.20 MIL/uL   Hemoglobin 13.2 12.0 - 16.0 g/dL   HCT 38.2 35.0 - 47.0 %   MCV 91.9 80.0 - 100.0 fL   MCH 31.6 26.0 - 34.0 pg   MCHC 34.4 32.0 - 36.0 g/dL   RDW 13.1 11.5 - 14.5 %   Platelets 149 (L) 150 - 440 K/uL   Neutrophils Relative % 58 %   Neutro Abs 3.4 1.4 - 6.5 K/uL   Lymphocytes Relative 27 %   Lymphs Abs 1.6 1.0 - 3.6 K/uL   Monocytes Relative 10 %   Monocytes Absolute 0.6 0.2 - 0.9 K/uL   Eosinophils Relative 4 %   Eosinophils Absolute 0.2 0 - 0.7 K/uL   Basophils Relative 1 %   Basophils Absolute 0.0 0 - 0.1 K/uL  CBC  Result Value Ref Range   WBC 5.5 3.6 - 11.0 K/uL   RBC 4.45 3.80 - 5.20 MIL/uL   Hemoglobin 13.9 12.0 - 16.0 g/dL   HCT 40.3 35.0 - 47.0 %  MCV 90.7 80.0 - 100.0 fL   MCH 31.1 26.0 - 34.0 pg   MCHC 34.3 32.0 - 36.0 g/dL   RDW 13.2 11.5 - 14.5 %   Platelets 148 (L) 150 - 440 K/uL  Basic metabolic panel  Result Value Ref Range   Sodium 140 135 - 145 mmol/L   Potassium 3.5 3.5 - 5.1 mmol/L   Chloride 108 101 - 111 mmol/L   CO2 27 22 - 32 mmol/L   Glucose, Bld 124 (H) 65 - 99 mg/dL   BUN 12 6 - 20 mg/dL   Creatinine, Ser 0.56 0.44 - 1.00 mg/dL   Calcium 9.2 8.9 - 10.3 mg/dL   GFR calc non Af Amer >60 >60 mL/min   GFR calc Af Amer >60 >60 mL/min   Anion gap 5 5 - 15      Assessment & Plan:   Problem List Items Addressed This Visit      Cardiovascular and Mediastinum   Essential hypertension - Primary    The current medical regimen is effective;  continue present plan and medications.       Acute cerebrovascular insufficiency transient focal neurologic deficit    On review Will continue Plavix      Venous insufficiency of both lower extremities    Stable for now      Senile purpura (HCC)    Reviewed senile purpura and Plavix will continue Plavix        Other   Advanced directives, counseling/discussion    A voluntary discussion about advance care planning including the explanation and discussion of advance directives was extensively discussed  with the patient.  Explanation about the health care proxy and Living will was reviewed and packet with forms with explanation of how to fill them out was given.  Already done..          Other Visit Diagnoses    Annual physical exam       Screening cholesterol level       Screening for diabetes mellitus (DM)       Thyroid disorder screen           Follow up plan: Return in about 6 months (around 05/06/2017) for Will also to TSH, BMP,  Lipids, ALT, AST.

## 2016-11-04 NOTE — Assessment & Plan Note (Signed)
Stable for now

## 2016-11-04 NOTE — Assessment & Plan Note (Signed)
A voluntary discussion about advance care planning including the explanation and discussion of advance directives was extensively discussed  with the patient.  Explanation about the health care proxy and Living will was reviewed and packet with forms with explanation of how to fill them out was given.  Already done.Marland Kitchen

## 2016-11-04 NOTE — Assessment & Plan Note (Signed)
Reviewed senile purpura and Plavix will continue Plavix

## 2016-11-04 NOTE — Assessment & Plan Note (Signed)
On review Will continue Plavix

## 2016-11-04 NOTE — Assessment & Plan Note (Signed)
The current medical regimen is effective;  continue present plan and medications.  

## 2016-11-05 DIAGNOSIS — I10 Essential (primary) hypertension: Secondary | ICD-10-CM | POA: Diagnosis not present

## 2016-11-05 DIAGNOSIS — M48061 Spinal stenosis, lumbar region without neurogenic claudication: Secondary | ICD-10-CM | POA: Diagnosis not present

## 2016-11-05 DIAGNOSIS — K810 Acute cholecystitis: Secondary | ICD-10-CM | POA: Diagnosis not present

## 2016-11-05 DIAGNOSIS — K5790 Diverticulosis of intestine, part unspecified, without perforation or abscess without bleeding: Secondary | ICD-10-CM | POA: Diagnosis not present

## 2016-11-05 DIAGNOSIS — R531 Weakness: Secondary | ICD-10-CM | POA: Diagnosis not present

## 2016-11-05 DIAGNOSIS — M1991 Primary osteoarthritis, unspecified site: Secondary | ICD-10-CM | POA: Diagnosis not present

## 2016-11-10 DIAGNOSIS — M48061 Spinal stenosis, lumbar region without neurogenic claudication: Secondary | ICD-10-CM | POA: Diagnosis not present

## 2016-11-10 DIAGNOSIS — I10 Essential (primary) hypertension: Secondary | ICD-10-CM | POA: Diagnosis not present

## 2016-11-10 DIAGNOSIS — K5790 Diverticulosis of intestine, part unspecified, without perforation or abscess without bleeding: Secondary | ICD-10-CM | POA: Diagnosis not present

## 2016-11-10 DIAGNOSIS — K810 Acute cholecystitis: Secondary | ICD-10-CM | POA: Diagnosis not present

## 2016-11-10 DIAGNOSIS — R531 Weakness: Secondary | ICD-10-CM | POA: Diagnosis not present

## 2016-11-10 DIAGNOSIS — M1991 Primary osteoarthritis, unspecified site: Secondary | ICD-10-CM | POA: Diagnosis not present

## 2016-11-12 DIAGNOSIS — K5790 Diverticulosis of intestine, part unspecified, without perforation or abscess without bleeding: Secondary | ICD-10-CM | POA: Diagnosis not present

## 2016-11-12 DIAGNOSIS — M1991 Primary osteoarthritis, unspecified site: Secondary | ICD-10-CM | POA: Diagnosis not present

## 2016-11-12 DIAGNOSIS — R531 Weakness: Secondary | ICD-10-CM | POA: Diagnosis not present

## 2016-11-12 DIAGNOSIS — K810 Acute cholecystitis: Secondary | ICD-10-CM | POA: Diagnosis not present

## 2016-11-12 DIAGNOSIS — I10 Essential (primary) hypertension: Secondary | ICD-10-CM | POA: Diagnosis not present

## 2016-11-12 DIAGNOSIS — M48061 Spinal stenosis, lumbar region without neurogenic claudication: Secondary | ICD-10-CM | POA: Diagnosis not present

## 2016-11-17 DIAGNOSIS — I10 Essential (primary) hypertension: Secondary | ICD-10-CM | POA: Diagnosis not present

## 2016-11-17 DIAGNOSIS — R531 Weakness: Secondary | ICD-10-CM | POA: Diagnosis not present

## 2016-11-17 DIAGNOSIS — K810 Acute cholecystitis: Secondary | ICD-10-CM | POA: Diagnosis not present

## 2016-11-17 DIAGNOSIS — K5790 Diverticulosis of intestine, part unspecified, without perforation or abscess without bleeding: Secondary | ICD-10-CM | POA: Diagnosis not present

## 2016-11-17 DIAGNOSIS — M1991 Primary osteoarthritis, unspecified site: Secondary | ICD-10-CM | POA: Diagnosis not present

## 2016-11-17 DIAGNOSIS — M48061 Spinal stenosis, lumbar region without neurogenic claudication: Secondary | ICD-10-CM | POA: Diagnosis not present

## 2016-11-20 DIAGNOSIS — Z961 Presence of intraocular lens: Secondary | ICD-10-CM | POA: Diagnosis not present

## 2016-11-20 DIAGNOSIS — H018 Other specified inflammations of eyelid: Secondary | ICD-10-CM | POA: Diagnosis not present

## 2016-11-22 DIAGNOSIS — M48061 Spinal stenosis, lumbar region without neurogenic claudication: Secondary | ICD-10-CM | POA: Diagnosis not present

## 2016-11-22 DIAGNOSIS — Z8673 Personal history of transient ischemic attack (TIA), and cerebral infarction without residual deficits: Secondary | ICD-10-CM | POA: Diagnosis not present

## 2016-11-22 DIAGNOSIS — M810 Age-related osteoporosis without current pathological fracture: Secondary | ICD-10-CM | POA: Diagnosis not present

## 2016-11-22 DIAGNOSIS — K810 Acute cholecystitis: Secondary | ICD-10-CM | POA: Diagnosis not present

## 2016-11-22 DIAGNOSIS — I10 Essential (primary) hypertension: Secondary | ICD-10-CM | POA: Diagnosis not present

## 2016-11-22 DIAGNOSIS — M1991 Primary osteoarthritis, unspecified site: Secondary | ICD-10-CM | POA: Diagnosis not present

## 2016-11-22 DIAGNOSIS — R531 Weakness: Secondary | ICD-10-CM | POA: Diagnosis not present

## 2016-11-22 DIAGNOSIS — E669 Obesity, unspecified: Secondary | ICD-10-CM | POA: Diagnosis not present

## 2016-11-22 DIAGNOSIS — K5790 Diverticulosis of intestine, part unspecified, without perforation or abscess without bleeding: Secondary | ICD-10-CM | POA: Diagnosis not present

## 2016-12-09 DIAGNOSIS — I10 Essential (primary) hypertension: Secondary | ICD-10-CM | POA: Diagnosis not present

## 2016-12-09 DIAGNOSIS — M1991 Primary osteoarthritis, unspecified site: Secondary | ICD-10-CM | POA: Diagnosis not present

## 2016-12-09 DIAGNOSIS — M48061 Spinal stenosis, lumbar region without neurogenic claudication: Secondary | ICD-10-CM | POA: Diagnosis not present

## 2016-12-09 DIAGNOSIS — K810 Acute cholecystitis: Secondary | ICD-10-CM | POA: Diagnosis not present

## 2016-12-09 DIAGNOSIS — R531 Weakness: Secondary | ICD-10-CM | POA: Diagnosis not present

## 2016-12-09 DIAGNOSIS — K5790 Diverticulosis of intestine, part unspecified, without perforation or abscess without bleeding: Secondary | ICD-10-CM | POA: Diagnosis not present

## 2016-12-11 DIAGNOSIS — M1991 Primary osteoarthritis, unspecified site: Secondary | ICD-10-CM | POA: Diagnosis not present

## 2016-12-11 DIAGNOSIS — K810 Acute cholecystitis: Secondary | ICD-10-CM | POA: Diagnosis not present

## 2016-12-11 DIAGNOSIS — M48061 Spinal stenosis, lumbar region without neurogenic claudication: Secondary | ICD-10-CM | POA: Diagnosis not present

## 2016-12-11 DIAGNOSIS — K5790 Diverticulosis of intestine, part unspecified, without perforation or abscess without bleeding: Secondary | ICD-10-CM | POA: Diagnosis not present

## 2016-12-11 DIAGNOSIS — I10 Essential (primary) hypertension: Secondary | ICD-10-CM | POA: Diagnosis not present

## 2016-12-11 DIAGNOSIS — R531 Weakness: Secondary | ICD-10-CM | POA: Diagnosis not present

## 2016-12-16 DIAGNOSIS — R531 Weakness: Secondary | ICD-10-CM | POA: Diagnosis not present

## 2016-12-16 DIAGNOSIS — M48061 Spinal stenosis, lumbar region without neurogenic claudication: Secondary | ICD-10-CM | POA: Diagnosis not present

## 2016-12-16 DIAGNOSIS — K5790 Diverticulosis of intestine, part unspecified, without perforation or abscess without bleeding: Secondary | ICD-10-CM | POA: Diagnosis not present

## 2016-12-16 DIAGNOSIS — K810 Acute cholecystitis: Secondary | ICD-10-CM | POA: Diagnosis not present

## 2016-12-16 DIAGNOSIS — I10 Essential (primary) hypertension: Secondary | ICD-10-CM | POA: Diagnosis not present

## 2016-12-16 DIAGNOSIS — M1991 Primary osteoarthritis, unspecified site: Secondary | ICD-10-CM | POA: Diagnosis not present

## 2016-12-18 ENCOUNTER — Ambulatory Visit (INDEPENDENT_AMBULATORY_CARE_PROVIDER_SITE_OTHER): Payer: Medicare Other | Admitting: Vascular Surgery

## 2016-12-18 VITALS — BP 144/95 | HR 79 | Resp 16 | Ht 66.0 in | Wt 238.0 lb

## 2016-12-18 DIAGNOSIS — I10 Essential (primary) hypertension: Secondary | ICD-10-CM | POA: Diagnosis not present

## 2016-12-18 DIAGNOSIS — I872 Venous insufficiency (chronic) (peripheral): Secondary | ICD-10-CM | POA: Diagnosis not present

## 2016-12-18 DIAGNOSIS — R600 Localized edema: Secondary | ICD-10-CM | POA: Diagnosis not present

## 2016-12-18 DIAGNOSIS — I6523 Occlusion and stenosis of bilateral carotid arteries: Secondary | ICD-10-CM | POA: Diagnosis not present

## 2016-12-18 NOTE — Progress Notes (Signed)
MRN : [id number]  Jennifer Murray is a 83 y.o. (January 07, 1928) female who presents with chief complaint of lymphedema follow up.  History of Present Illness: The patient returns to the office for followup evaluation regarding leg swelling.  The swelling has improved quite a bit and the pain associated with swelling has decreased substantially. There have not been any interval development of a ulcerations or wounds.  Since the previous visit the patient has been wearing graduated compression stockings and has noted little significant improvement in the lymphedema. The patient has been using compression routinely morning until night.  The patient also states elevation during the day and exercise is being done too.    No outpatient prescriptions have been marked as taking for the 12/18/16 encounter (Appointment) with Delana Meyer, Dolores Lory, MD.    Past Medical History:  Diagnosis Date  . Acute cerebrovascular insufficiency transient focal neurologic deficit 2003  . Arthritis   . Cancer (HCC)    Basal Cell  . Diverticulosis   . Leg edema   . Lumbar spinal stenosis   . Obesity   . Osteoporosis   . Stroke [facility])     Past Surgical History:  Procedure Laterality Date  . BREAST BIOPSY Left 1975   Benign per patient  . COLONOSCOPY W/ POLYPECTOMY    . EYE SURGERY Bilateral 2016   cataract  . JOINT REPLACEMENT Left 2011   knee    Social History Social History  Substance Use Topics  . Smoking status: Passive Smoke Exposure - Never Smoker  . Smokeless tobacco: Never Used  . Alcohol use No    Family History Family History  Problem Relation Age of Onset  . Cancer Mother        colon  . Heart attack Father   . Heart attack Brother   . Diabetes Paternal Grandmother   . Cancer Paternal Grandmother        breast  . Heart attack Paternal Grandfather     Allergies  Allergen Reactions  . Ciprocin-Fluocin-Procin [Fluocinolone Acetonide] Other (See Comments)    dizziness  .  Ciprofloxacin Other (See Comments)    Dizziness.     REVIEW OF SYSTEMS (Negative unless checked)  Constitutional: [] Weight loss  [] Fever  [] Chills Cardiac: [] Chest pain   [] Chest pressure   [] Palpitations   [] Shortness of breath when laying flat   [] Shortness of breath with exertion. Vascular:  [] Pain in legs with walking   [x] Pain in legs with standing  [] History of DVT   [] Phlebitis   [x] Swelling in legs   [] Varicose veins   [] Non-healing ulcers Pulmonary:   [] Uses home oxygen   [] Productive cough   [] Hemoptysis   [] Wheeze  [] COPD   [] Asthma Neurologic:  [] Dizziness   [] Seizures   [] History of stroke   [] History of TIA  [] Aphasia   [] Vissual changes   [] Weakness or numbness in arm   [] Weakness or numbness in leg Musculoskeletal:   [] Joint swelling   [x] Joint pain   [] Low back pain Hematologic:  [] Easy bruising  [] Easy bleeding   [] Hypercoagulable state   [] Anemic Gastrointestinal:  [] Diarrhea   [] Vomiting  [] Gastroesophageal reflux/heartburn   [] Difficulty swallowing. Genitourinary:  [] Chronic kidney disease   [] Difficult urination  [] Frequent urination   [] Blood in urine Skin:  [] Rashes   [] Ulcers  Psychological:  [] History of anxiety   []  History of major depression.  Physical Examination  There were no vitals filed for this visit. There is no height or weight on file  to calculate BMI. Gen: WD/WN, NAD Head: [place]/AT, No temporalis wasting.  Ear/Nose/Throat: Hearing grossly intact, nares w/o erythema or drainage Eyes: PER, EOMI, sclera nonicteric.  Neck: Supple, no large masses.   Pulmonary:  Good air movement, no audible wheezing bilaterally, no use of accessory muscles.  Cardiac: RRR, no JVD Vascular:  Mild venous stasis changes to the legs bilaterally.  2+ soft pitting edema Vessel Right Left  Radial Palpable Palpable  PT Palpable Palpable  DP Palpable Palpable  Gastrointestinal: Non-distended. No guarding/no peritoneal signs.  Musculoskeletal: M/S 5/5 throughout.  No deformity  or atrophy.  Neurologic: CN 2-12 intact. Symmetrical.  Speech is fluent. Motor exam as listed above. Psychiatric: Judgment intact, Mood & affect appropriate for pt's clinical situation. Dermatologic: Mild venous rashes no ulcers noted.  No changes consistent with cellulitis. Lymph : No lichenification or skin changes of chronic lymphedema.  CBC Lab Results  Component Value Date   WBC 5.5 09/22/2016   HGB 13.9 09/22/2016   HCT 40.3 09/22/2016   MCV 90.7 09/22/2016   PLT 148 (L) 09/22/2016    BMET    Component Value Date/Time   NA 140 09/22/2016 0324   NA 143 04/02/2016 1130   K 3.5 09/22/2016 0324   CL 108 09/22/2016 0324   CO2 27 09/22/2016 0324   GLUCOSE 124 (H) 09/22/2016 0324   BUN 12 09/22/2016 0324   BUN 17 04/02/2016 1130   CREATININE 0.56 09/22/2016 0324   CALCIUM 9.2 09/22/2016 0324   GFRNONAA >60 09/22/2016 0324   GFRAA >60 09/22/2016 0324   CrCl cannot be calculated (Patient's most recent lab result is older than the maximum 21 days allowed.).  COAG No results found for: INR, PROTIME  Radiology No results found.  Assessment/Plan 1. Venous insufficiency of both lower extremities  No surgery or intervention at this point in time.    I have reviewed my discussion with the patient regarding lymphedema and why it  causes symptoms.  Patient will continue wearing graduated compression stockings class 1 (20-30 mmHg) on a daily basis a prescription was given. The patient is reminded to put the stockings on first thing in the morning and removing them in the evening. The patient is instructed specifically not to sleep in the stockings.   In addition, behavioral modification throughout the day will be continued.  This will include frequent elevation (such as in a recliner), use of over the counter pain medications as needed and exercise such as walking.  I have reviewed systemic causes for chronic edema such as liver, kidney and cardiac etiologies and there does not  appear to be any significant changes in these organ systems over the past year.  The patient is under the impression that these organ systems are all stable and unchanged.    The patient will continue aggressive use of the  lymph pump.  This will continue to improve the edema control and prevent sequela such as ulcers and infections.   The patient will follow-up with me on an annual basis.    2. Leg edema  No surgery or intervention at this point in time.    I have reviewed my discussion with the patient regarding lymphedema and why it  causes symptoms.  Patient will continue wearing graduated compression stockings class 1 (20-30 mmHg) on a daily basis a prescription was given. The patient is reminded to put the stockings on first thing in the morning and removing them in the evening. The patient is instructed specifically not  to sleep in the stockings.   In addition, behavioral modification throughout the day will be continued.  This will include frequent elevation (such as in a recliner), use of over the counter pain medications as needed and exercise such as walking.  I have reviewed systemic causes for chronic edema such as liver, kidney and cardiac etiologies and there does not appear to be any significant changes in these organ systems over the past year.  The patient is under the impression that these organ systems are all stable and unchanged.    The patient will continue aggressive use of the  lymph pump.  This will continue to improve the edema control and prevent sequela such as ulcers and infections.   The patient will follow-up with me on an annual basis.    3. Essential hypertension Continue antihypertensive medications as already ordered, these medications have been reviewed and there are no changes at this time.   4. Atherosclerosis of both carotid arteries Recommend:  Given the patient's asymptomatic subcritical stenosis no further invasive testing or surgery at this  time.  Duplex ultrasound shows <50% stenosis bilaterally.  Continue antiplatelet therapy as prescribed Continue management of CAD, HTN and Hyperlipidemia Healthy heart diet,  encouraged exercise at least 4 times per week    Hortencia Pilar, MD  12/18/2016 10:10 AM

## 2016-12-19 DIAGNOSIS — M48061 Spinal stenosis, lumbar region without neurogenic claudication: Secondary | ICD-10-CM | POA: Diagnosis not present

## 2016-12-19 DIAGNOSIS — K5790 Diverticulosis of intestine, part unspecified, without perforation or abscess without bleeding: Secondary | ICD-10-CM | POA: Diagnosis not present

## 2016-12-19 DIAGNOSIS — M1991 Primary osteoarthritis, unspecified site: Secondary | ICD-10-CM | POA: Diagnosis not present

## 2016-12-19 DIAGNOSIS — K810 Acute cholecystitis: Secondary | ICD-10-CM | POA: Diagnosis not present

## 2016-12-19 DIAGNOSIS — I10 Essential (primary) hypertension: Secondary | ICD-10-CM | POA: Diagnosis not present

## 2016-12-19 DIAGNOSIS — R531 Weakness: Secondary | ICD-10-CM | POA: Diagnosis not present

## 2016-12-23 DIAGNOSIS — K5790 Diverticulosis of intestine, part unspecified, without perforation or abscess without bleeding: Secondary | ICD-10-CM | POA: Diagnosis not present

## 2016-12-23 DIAGNOSIS — M48061 Spinal stenosis, lumbar region without neurogenic claudication: Secondary | ICD-10-CM | POA: Diagnosis not present

## 2016-12-23 DIAGNOSIS — M1991 Primary osteoarthritis, unspecified site: Secondary | ICD-10-CM | POA: Diagnosis not present

## 2016-12-23 DIAGNOSIS — K810 Acute cholecystitis: Secondary | ICD-10-CM | POA: Diagnosis not present

## 2016-12-23 DIAGNOSIS — I10 Essential (primary) hypertension: Secondary | ICD-10-CM | POA: Diagnosis not present

## 2016-12-23 DIAGNOSIS — R531 Weakness: Secondary | ICD-10-CM | POA: Diagnosis not present

## 2016-12-24 ENCOUNTER — Emergency Department: Payer: Medicare Other

## 2016-12-24 ENCOUNTER — Inpatient Hospital Stay
Admission: EM | Admit: 2016-12-24 | Discharge: 2016-12-29 | DRG: 872 | Disposition: A | Discharge status: Home / Self Care | Payer: Medicare Other | Attending: Internal Medicine | Admitting: Internal Medicine

## 2016-12-24 ENCOUNTER — Encounter: Payer: Self-pay | Admitting: Emergency Medicine

## 2016-12-24 DIAGNOSIS — A419 Sepsis, unspecified organism: Principal | ICD-10-CM | POA: Diagnosis present

## 2016-12-24 DIAGNOSIS — Z515 Encounter for palliative care: Secondary | ICD-10-CM

## 2016-12-24 DIAGNOSIS — S72044A Nondisplaced fracture of base of neck of right femur, initial encounter for closed fracture: Secondary | ICD-10-CM | POA: Diagnosis not present

## 2016-12-24 DIAGNOSIS — E860 Dehydration: Secondary | ICD-10-CM | POA: Diagnosis present

## 2016-12-24 DIAGNOSIS — Z800 Family history of malignant neoplasm of digestive organs: Secondary | ICD-10-CM

## 2016-12-24 DIAGNOSIS — Z8249 Family history of ischemic heart disease and other diseases of the circulatory system: Secondary | ICD-10-CM

## 2016-12-24 DIAGNOSIS — M810 Age-related osteoporosis without current pathological fracture: Secondary | ICD-10-CM | POA: Diagnosis present

## 2016-12-24 DIAGNOSIS — M25551 Pain in right hip: Secondary | ICD-10-CM | POA: Diagnosis not present

## 2016-12-24 DIAGNOSIS — Z803 Family history of malignant neoplasm of breast: Secondary | ICD-10-CM

## 2016-12-24 DIAGNOSIS — Z8673 Personal history of transient ischemic attack (TIA), and cerebral infarction without residual deficits: Secondary | ICD-10-CM | POA: Diagnosis not present

## 2016-12-24 DIAGNOSIS — Z833 Family history of diabetes mellitus: Secondary | ICD-10-CM

## 2016-12-24 DIAGNOSIS — S299XXA Unspecified injury of thorax, initial encounter: Secondary | ICD-10-CM | POA: Diagnosis not present

## 2016-12-24 DIAGNOSIS — R531 Weakness: Secondary | ICD-10-CM

## 2016-12-24 DIAGNOSIS — R1310 Dysphagia, unspecified: Secondary | ICD-10-CM | POA: Diagnosis present

## 2016-12-24 DIAGNOSIS — M25511 Pain in right shoulder: Secondary | ICD-10-CM | POA: Diagnosis not present

## 2016-12-24 DIAGNOSIS — W19XXXA Unspecified fall, initial encounter: Secondary | ICD-10-CM | POA: Diagnosis not present

## 2016-12-24 DIAGNOSIS — E669 Obesity, unspecified: Secondary | ICD-10-CM | POA: Diagnosis not present

## 2016-12-24 DIAGNOSIS — M48061 Spinal stenosis, lumbar region without neurogenic claudication: Secondary | ICD-10-CM | POA: Diagnosis present

## 2016-12-24 DIAGNOSIS — I890 Lymphedema, not elsewhere classified: Secondary | ICD-10-CM | POA: Diagnosis present

## 2016-12-24 DIAGNOSIS — Z6838 Body mass index (BMI) 38.0-38.9, adult: Secondary | ICD-10-CM

## 2016-12-24 DIAGNOSIS — N390 Urinary tract infection, site not specified: Secondary | ICD-10-CM | POA: Diagnosis not present

## 2016-12-24 DIAGNOSIS — I959 Hypotension, unspecified: Secondary | ICD-10-CM | POA: Diagnosis present

## 2016-12-24 DIAGNOSIS — S79911A Unspecified injury of right hip, initial encounter: Secondary | ICD-10-CM | POA: Diagnosis not present

## 2016-12-24 DIAGNOSIS — M1990 Unspecified osteoarthritis, unspecified site: Secondary | ICD-10-CM | POA: Diagnosis present

## 2016-12-24 DIAGNOSIS — T796XXA Traumatic ischemia of muscle, initial encounter: Secondary | ICD-10-CM | POA: Diagnosis not present

## 2016-12-24 DIAGNOSIS — Z66 Do not resuscitate: Secondary | ICD-10-CM | POA: Diagnosis present

## 2016-12-24 DIAGNOSIS — I6781 Acute cerebrovascular insufficiency: Secondary | ICD-10-CM | POA: Diagnosis not present

## 2016-12-24 DIAGNOSIS — Z96652 Presence of left artificial knee joint: Secondary | ICD-10-CM | POA: Diagnosis present

## 2016-12-24 DIAGNOSIS — Z7902 Long term (current) use of antithrombotics/antiplatelets: Secondary | ICD-10-CM

## 2016-12-24 DIAGNOSIS — K5790 Diverticulosis of intestine, part unspecified, without perforation or abscess without bleeding: Secondary | ICD-10-CM | POA: Diagnosis present

## 2016-12-24 DIAGNOSIS — Z881 Allergy status to other antibiotic agents status: Secondary | ICD-10-CM

## 2016-12-24 DIAGNOSIS — Z7189 Other specified counseling: Secondary | ICD-10-CM

## 2016-12-24 DIAGNOSIS — Z85828 Personal history of other malignant neoplasm of skin: Secondary | ICD-10-CM

## 2016-12-24 DIAGNOSIS — E876 Hypokalemia: Secondary | ICD-10-CM | POA: Diagnosis not present

## 2016-12-24 DIAGNOSIS — N3090 Cystitis, unspecified without hematuria: Secondary | ICD-10-CM

## 2016-12-24 DIAGNOSIS — Z7722 Contact with and (suspected) exposure to environmental tobacco smoke (acute) (chronic): Secondary | ICD-10-CM | POA: Diagnosis present

## 2016-12-24 DIAGNOSIS — S4991XA Unspecified injury of right shoulder and upper arm, initial encounter: Secondary | ICD-10-CM | POA: Diagnosis not present

## 2016-12-24 LAB — COMPREHENSIVE METABOLIC PANEL
ALBUMIN: 3.4 g/dL — AB (ref 3.5–5.0)
ALK PHOS: 80 U/L (ref 38–126)
ALT: 49 U/L (ref 14–54)
AST: 159 U/L — AB (ref 15–41)
Anion gap: 7 (ref 5–15)
BILIRUBIN TOTAL: 1.4 mg/dL — AB (ref 0.3–1.2)
BUN: 19 mg/dL (ref 6–20)
CO2: 23 mmol/L (ref 22–32)
CREATININE: 0.68 mg/dL (ref 0.44–1.00)
Calcium: 9.3 mg/dL (ref 8.9–10.3)
Chloride: 106 mmol/L (ref 101–111)
GFR calc Af Amer: >60 mL/min (ref >60)
GLUCOSE: 153 mg/dL — AB (ref 65–99)
Potassium: 3.5 mmol/L (ref 3.5–5.1)
Sodium: 136 mmol/L (ref 135–145)
TOTAL PROTEIN: 6.2 g/dL — AB (ref 6.5–8.1)

## 2016-12-24 LAB — URINALYSIS, COMPLETE (UACMP) WITH MICROSCOPIC
Bilirubin Urine: NEGATIVE
Glucose, UA: NEGATIVE mg/dL
Ketones, ur: 5 mg/dL — AB
Nitrite: POSITIVE — AB
PROTEIN: 30 mg/dL — AB
SPECIFIC GRAVITY, URINE: 1.02 (ref 1.005–1.030)
SQUAMOUS EPITHELIAL / LPF: NONE SEEN
pH: 5 (ref 5.0–8.0)

## 2016-12-24 LAB — CK: CK TOTAL: 3580 U/L — AB (ref 38–234)

## 2016-12-24 LAB — BLOOD GAS, VENOUS
Acid-Base Excess: 3.5 mmol/L — ABNORMAL HIGH (ref 0.0–2.0)
BICARBONATE: 26.7 mmol/L (ref 20.0–28.0)
O2 Saturation: 82 %
PATIENT TEMPERATURE: 37
PH VEN: 7.49 — AB (ref 7.250–7.430)
pCO2, Ven: 35 mmHg — ABNORMAL LOW (ref 44.0–60.0)
pO2, Ven: 42 mmHg (ref 32.0–45.0)

## 2016-12-24 LAB — CBC WITH DIFFERENTIAL/PLATELET
BASOS ABS: 0 10*3/uL (ref 0–0.1)
Basophils Relative: 0 %
Eosinophils Absolute: 0 10*3/uL (ref 0–0.7)
Eosinophils Relative: 0 %
HEMATOCRIT: 40.1 % (ref 35.0–47.0)
HEMOGLOBIN: 13.9 g/dL (ref 12.0–16.0)
LYMPHS PCT: 8 %
Lymphs Abs: 0.8 10*3/uL — ABNORMAL LOW (ref 1.0–3.6)
MCH: 30.9 pg (ref 26.0–34.0)
MCHC: 34.7 g/dL (ref 32.0–36.0)
MCV: 89.2 fL (ref 80.0–100.0)
MONO ABS: 0.7 10*3/uL (ref 0.2–0.9)
Monocytes Relative: 7 %
NEUTROS ABS: 8.9 10*3/uL — AB (ref 1.4–6.5)
NEUTROS PCT: 85 %
Platelets: 157 10*3/uL (ref 150–440)
RBC: 4.49 MIL/uL (ref 3.80–5.20)
RDW: 12.8 % (ref 11.5–14.5)
WBC: 10.5 10*3/uL (ref 3.6–11.0)

## 2016-12-24 LAB — TROPONIN I: TROPONIN I: 0.03 ng/mL — AB (ref <0.03)

## 2016-12-24 LAB — LACTIC ACID, PLASMA: LACTIC ACID, VENOUS: 1.1 mmol/L (ref 0.5–1.9)

## 2016-12-24 MED ORDER — SODIUM CHLORIDE 0.9 % IV SOLN: 1000.0000 mL | Freq: Once | INTRAVENOUS | Status: DC

## 2016-12-24 MED ORDER — PIPERACILLIN-TAZOBACTAM 3.375 G IVPB 30 MIN
3.3750 g | Freq: Once | INTRAVENOUS | Status: AC
  Administered 2016-12-24: 3.375 g via INTRAVENOUS

## 2016-12-24 MED ORDER — SODIUM CHLORIDE 0.9 % IV SOLN
1000.0000 mL | Freq: Once | INTRAVENOUS | Status: AC
  Administered 2016-12-24: 1000 mL via INTRAVENOUS

## 2016-12-24 MED ORDER — VANCOMYCIN HCL IN DEXTROSE 1-5 GM/200ML-% IV SOLN
1000.0000 mg | Freq: Once | INTRAVENOUS | Status: AC
  Administered 2016-12-24: 1000 mg via INTRAVENOUS
  Filled 2016-12-24: qty 200

## 2016-12-24 MED ORDER — SODIUM CHLORIDE 0.9 % IV BOLUS (SEPSIS)
1000.0000 mL | Freq: Once | INTRAVENOUS | Status: AC
  Administered 2016-12-24: 1000 mL via INTRAVENOUS

## 2016-12-24 NOTE — ED Notes (Signed)
Patient transported to X-ray 

## 2016-12-24 NOTE — ED Triage Notes (Signed)
Pt in via [facility] from home; pt with generalized weakness and some confusion since last night.  Pt with fall last night, denied transportation via EMS early this morning.  Per EMS, pt febrile at 102.4, given 1,000mg  Tylenol en route.  Pt A/Ox4, hypotensive upon arrival.  Pt with complaints of pain to buttocks, states thats what she landed on when she fell.

## 2016-12-24 NOTE — ED Provider Notes (Addendum)
[facility] Emergency Department Provider Note       Time seen: ----------------------------------------- 8:33 PM on 12/24/2016 -----------------------------------------     I have reviewed the triage vital signs and the nursing notes.   HISTORY   Chief Complaint No chief complaint on file.    HPI Jennifer Murray is a 83 y.o. female who presents to the ED for weakness and a fall. Patient was found on the ground by her family. She did not use her life alert when she fell due to weakness. EMS arrived and found her febrile and complaining of right hip and right shoulder pain. She was on the ground for most of the night. She denies any specific complaints at this time other than weakness.   Past Medical History:  Diagnosis Date  . Acute cerebrovascular insufficiency transient focal neurologic deficit 2003  . Arthritis   . Cancer (HCC)    Basal Cell  . Diverticulosis   . Leg edema   . Lumbar spinal stenosis   . Obesity   . Osteoporosis   . Stroke [facility])     Patient Active Problem List   Diagnosis Date Noted  . Senile purpura ([place]) 11/04/2016  . Acute cerebrovascular insufficiency transient focal neurologic deficit   . Arthritis   . Cancer ([place])   . Diverticulosis   . Obesity   . Osteoporosis   . Acute cholecystitis 09/19/2016  . [place] intertrigo 04/02/2016  . Advanced directives, counseling/discussion 04/02/2016  . Primary osteoarthritis of both shoulders 04/04/2015  . Essential hypertension 04/02/2015  . Lumbar spinal stenosis 04/02/2015  . Leg edema 04/02/2015  . LVH (left ventricular hypertrophy) due to hypertensive disease, without heart failure 01/24/2015  . Venous insufficiency of both lower extremities 01/10/2015  . Shortness of breath 12/25/2014  . Atherosclerosis of both carotid arteries 12/25/2014    Past Surgical History:  Procedure Laterality Date  . BREAST BIOPSY Left 1975   Benign per patient  . COLONOSCOPY W/  POLYPECTOMY    . EYE SURGERY Bilateral 2016   cataract  . JOINT REPLACEMENT Left 2011   knee    Allergies Ciprocin-fluocin-procin [fluocinolone acetonide] and Ciprofloxacin  Social History Social History  Substance Use Topics  . Smoking status: Passive Smoke Exposure - Never Smoker  . Smokeless tobacco: Never Used  . Alcohol use No    Review of Systems Constitutional: Positive for fever Eyes: Negative for vision changes ENT:  Negative for congestion, sore throat Cardiovascular: Negative for chest pain. Respiratory: Negative for shortness of breath. Gastrointestinal: Negative for abdominal pain, vomiting and diarrhea. Genitourinary: Negative for dysuria. Musculoskeletal: Positive for right shoulder and right hip pain Skin: Negative for rash. Neurological: Negative for headaches, focal weakness or numbness.  All systems negative/normal/unremarkable except as stated in the HPI  ____________________________________________   PHYSICAL EXAM:  VITAL SIGNS: ED Triage Vitals  Enc Vitals Group     BP      Pulse      Resp      Temp      Temp src      SpO2      Weight      Height      Head Circumference      Peak Flow      Pain Score      Pain Loc      Pain Edu?      Excl. in [place]?     Constitutional: Alert, Well appearing and in no distress. Eyes: Conjunctivae are normal. Normal  extraocular movements. ENT   Head: Normocephalic and atraumatic.   Nose: No congestion/rhinnorhea.   Mouth/Throat: Mucous membranes are moist.   Neck: No stridor. Cardiovascular: Normal rate, regular rhythm. No murmurs, rubs, or gallops. Respiratory: Normal respiratory effort without tachypnea nor retractions. Breath sounds are clear and equal bilaterally. No wheezes/rales/rhonchi. Gastrointestinal: Soft and nontender. Normal bowel sounds Musculoskeletal: Nontender with normal range of motion in extremities. No lower extremity tenderness nor edema. Neurologic:  Normal speech  and language. No gross focal neurologic deficits are appreciated.  Skin:  Skin is warm, dry and intact. No rash noted. Psychiatric: Mood and affect are normal. Speech and behavior are normal.  ____________________________________________  EKG: Interpreted by me.Sinus tachycardia with a rate of 113 bpm, borderline long PR interval, low voltage, left anterior fascicular block, borderline prolonged QT.  ____________________________________________  ED COURSE:  Pertinent labs & imaging results that were available during my care of the patient were reviewed by me and considered in my medical decision making (see chart for details). Patient presents for a fall with fever noted, we will assess with labs and imaging as indicated. Clinical Course as of Dec 24 2348  Wed Dec 24, 2016  2320 Current BP 99/62  [JW]    Clinical Course User Index [JW] Earleen Newport, MD   Procedures ____________________________________________   LABS (pertinent positives/negatives)  Labs Reviewed  CBC WITH DIFFERENTIAL/PLATELET - Abnormal; Notable for the following:       Result Value   Neutro Abs 8.9 (*)    Lymphs Abs 0.8 (*)    All other components within normal limits  COMPREHENSIVE METABOLIC PANEL - Abnormal; Notable for the following:    Glucose, Bld 153 (*)    Total Protein 6.2 (*)    Albumin 3.4 (*)    AST 159 (*)    Total Bilirubin 1.4 (*)    All other components within normal limits  TROPONIN I - Abnormal; Notable for the following:    Troponin I 0.03 (*)    All other components within normal limits  URINALYSIS, COMPLETE (UACMP) WITH MICROSCOPIC - Abnormal; Notable for the following:    Color, Urine AMBER (*)    APPearance CLOUDY (*)    Hgb urine dipstick LARGE (*)    Ketones, ur 5 (*)    Protein, ur 30 (*)    Nitrite POSITIVE (*)    Leukocytes, UA MODERATE (*)    Bacteria, UA MANY (*)    All other components within normal limits  CK - Abnormal; Notable for the following:    Total  CK 3,580 (*)    All other components within normal limits  BLOOD GAS, VENOUS - Abnormal; Notable for the following:    pH, Ven 7.49 (*)    pCO2, Ven 35 (*)    Acid-Base Excess 3.5 (*)    All other components within normal limits  CULTURE, BLOOD (ROUTINE X 2)  CULTURE, BLOOD (ROUTINE X 2)  URINE CULTURE  LACTIC ACID, PLASMA  LACTIC ACID, PLASMA   CRITICAL CARE Performed by: Earleen Newport   Total critical care time: 30 minutes  Critical care time was exclusive of separately billable procedures and treating other patients.  Critical care was necessary to treat or prevent imminent or life-threatening deterioration.  Critical care was time spent personally by me on the following activities: development of treatment plan with patient and/or surrogate as well as nursing, discussions with consultants, evaluation of patient's response to treatment, examination of patient, obtaining history from patient  or surrogate, ordering and performing treatments and interventions, ordering and review of laboratory studies, ordering and review of radiographic studies, pulse oximetry and re-evaluation of patient's condition.  RADIOLOGY Images were viewed by me  Chest x-ray, right shoulder x-ray, right hip x-ray IMPRESSION: No evidence of acute fracture or dislocation of the right hip. Degenerative changes in the right hip. Small right hip effusion. Calcified uterine fibroids. IMPRESSION: No active disease.  IMPRESSION: 1. No acute fracture or dislocation. 2. Osteopenia with degenerative changes and evidence of chronic rotator cuff injury.  IMPRESSION: Degenerative changes in the hips. Cortical defect at the junction of the right femoral head and neck may indicate a nondisplaced fracture. ____________________________________________  FINAL ASSESSMENT AND PLAN  Fall, fever, elevated CK level, hypotension, Cystitis  Plan: Patient's labs and imaging were dictated above. Patient had  presented for generalized weakness with some confusion that started last night. She did fall last night and here has an elevated CK level was also febrile to a temperature 102.4. She has been hypotensive presumably from infection which is also being treated with saline infusions. We'll give him broad-spectrum antibiotics covering for urosepsis. Lactic acid was negative. I will discuss with the hospitalist for admission.   Earleen Newport, MD   Note: This note was generated in part or whole with voice recognition software. Voice recognition is usually quite accurate but there are transcription errors that can and very often do occur. I apologize for any typographical errors that were not detected and corrected.     Earleen Newport, MD 12/24/16 2312    Earleen Newport, MD 12/24/16 2351

## 2016-12-24 NOTE — ED Notes (Signed)
Patient transported to CT 

## 2016-12-25 DIAGNOSIS — Z803 Family history of malignant neoplasm of breast: Secondary | ICD-10-CM | POA: Diagnosis not present

## 2016-12-25 DIAGNOSIS — Z833 Family history of diabetes mellitus: Secondary | ICD-10-CM | POA: Diagnosis not present

## 2016-12-25 DIAGNOSIS — I6781 Acute cerebrovascular insufficiency: Secondary | ICD-10-CM | POA: Diagnosis present

## 2016-12-25 DIAGNOSIS — R531 Weakness: Secondary | ICD-10-CM | POA: Diagnosis not present

## 2016-12-25 DIAGNOSIS — M48061 Spinal stenosis, lumbar region without neurogenic claudication: Secondary | ICD-10-CM | POA: Diagnosis present

## 2016-12-25 DIAGNOSIS — Z9181 History of falling: Secondary | ICD-10-CM | POA: Diagnosis not present

## 2016-12-25 DIAGNOSIS — W19XXXA Unspecified fall, initial encounter: Secondary | ICD-10-CM | POA: Diagnosis present

## 2016-12-25 DIAGNOSIS — R1312 Dysphagia, oropharyngeal phase: Secondary | ICD-10-CM | POA: Diagnosis not present

## 2016-12-25 DIAGNOSIS — Z8673 Personal history of transient ischemic attack (TIA), and cerebral infarction without residual deficits: Secondary | ICD-10-CM | POA: Diagnosis not present

## 2016-12-25 DIAGNOSIS — Z7189 Other specified counseling: Secondary | ICD-10-CM

## 2016-12-25 DIAGNOSIS — Z96652 Presence of left artificial knee joint: Secondary | ICD-10-CM | POA: Diagnosis present

## 2016-12-25 DIAGNOSIS — S99922A Unspecified injury of left foot, initial encounter: Secondary | ICD-10-CM | POA: Diagnosis not present

## 2016-12-25 DIAGNOSIS — Z66 Do not resuscitate: Secondary | ICD-10-CM | POA: Diagnosis present

## 2016-12-25 DIAGNOSIS — T796XXA Traumatic ischemia of muscle, initial encounter: Secondary | ICD-10-CM | POA: Diagnosis present

## 2016-12-25 DIAGNOSIS — Z8249 Family history of ischemic heart disease and other diseases of the circulatory system: Secondary | ICD-10-CM | POA: Diagnosis not present

## 2016-12-25 DIAGNOSIS — E568 Deficiency of other vitamins: Secondary | ICD-10-CM | POA: Diagnosis not present

## 2016-12-25 DIAGNOSIS — I890 Lymphedema, not elsewhere classified: Secondary | ICD-10-CM | POA: Diagnosis present

## 2016-12-25 DIAGNOSIS — Z800 Family history of malignant neoplasm of digestive organs: Secondary | ICD-10-CM | POA: Diagnosis not present

## 2016-12-25 DIAGNOSIS — M6282 Rhabdomyolysis: Secondary | ICD-10-CM | POA: Diagnosis not present

## 2016-12-25 DIAGNOSIS — Z515 Encounter for palliative care: Secondary | ICD-10-CM | POA: Diagnosis not present

## 2016-12-25 DIAGNOSIS — Z5189 Encounter for other specified aftercare: Secondary | ICD-10-CM | POA: Diagnosis not present

## 2016-12-25 DIAGNOSIS — E669 Obesity, unspecified: Secondary | ICD-10-CM | POA: Diagnosis present

## 2016-12-25 DIAGNOSIS — N3090 Cystitis, unspecified without hematuria: Secondary | ICD-10-CM

## 2016-12-25 DIAGNOSIS — R7881 Bacteremia: Secondary | ICD-10-CM | POA: Diagnosis not present

## 2016-12-25 DIAGNOSIS — Z7902 Long term (current) use of antithrombotics/antiplatelets: Secondary | ICD-10-CM | POA: Diagnosis not present

## 2016-12-25 DIAGNOSIS — Z881 Allergy status to other antibiotic agents status: Secondary | ICD-10-CM | POA: Diagnosis not present

## 2016-12-25 DIAGNOSIS — Z6838 Body mass index (BMI) 38.0-38.9, adult: Secondary | ICD-10-CM | POA: Diagnosis not present

## 2016-12-25 DIAGNOSIS — M79672 Pain in left foot: Secondary | ICD-10-CM | POA: Diagnosis not present

## 2016-12-25 DIAGNOSIS — R2681 Unsteadiness on feet: Secondary | ICD-10-CM | POA: Diagnosis not present

## 2016-12-25 DIAGNOSIS — M25511 Pain in right shoulder: Secondary | ICD-10-CM | POA: Diagnosis not present

## 2016-12-25 DIAGNOSIS — S99921A Unspecified injury of right foot, initial encounter: Secondary | ICD-10-CM | POA: Diagnosis not present

## 2016-12-25 DIAGNOSIS — I6789 Other cerebrovascular disease: Secondary | ICD-10-CM | POA: Diagnosis not present

## 2016-12-25 DIAGNOSIS — M79671 Pain in right foot: Secondary | ICD-10-CM | POA: Diagnosis not present

## 2016-12-25 DIAGNOSIS — M6281 Muscle weakness (generalized): Secondary | ICD-10-CM | POA: Diagnosis not present

## 2016-12-25 DIAGNOSIS — I959 Hypotension, unspecified: Secondary | ICD-10-CM | POA: Diagnosis present

## 2016-12-25 DIAGNOSIS — E876 Hypokalemia: Secondary | ICD-10-CM | POA: Diagnosis not present

## 2016-12-25 DIAGNOSIS — N390 Urinary tract infection, site not specified: Secondary | ICD-10-CM | POA: Diagnosis present

## 2016-12-25 DIAGNOSIS — Z7722 Contact with and (suspected) exposure to environmental tobacco smoke (acute) (chronic): Secondary | ICD-10-CM | POA: Diagnosis present

## 2016-12-25 DIAGNOSIS — E860 Dehydration: Secondary | ICD-10-CM | POA: Diagnosis present

## 2016-12-25 DIAGNOSIS — Z7401 Bed confinement status: Secondary | ICD-10-CM | POA: Diagnosis not present

## 2016-12-25 DIAGNOSIS — K5790 Diverticulosis of intestine, part unspecified, without perforation or abscess without bleeding: Secondary | ICD-10-CM | POA: Diagnosis present

## 2016-12-25 DIAGNOSIS — M810 Age-related osteoporosis without current pathological fracture: Secondary | ICD-10-CM | POA: Diagnosis present

## 2016-12-25 DIAGNOSIS — K5909 Other constipation: Secondary | ICD-10-CM | POA: Diagnosis not present

## 2016-12-25 DIAGNOSIS — R498 Other voice and resonance disorders: Secondary | ICD-10-CM | POA: Diagnosis not present

## 2016-12-25 DIAGNOSIS — A419 Sepsis, unspecified organism: Secondary | ICD-10-CM | POA: Diagnosis present

## 2016-12-25 DIAGNOSIS — M1990 Unspecified osteoarthritis, unspecified site: Secondary | ICD-10-CM | POA: Diagnosis present

## 2016-12-25 LAB — CBC
HCT: 37.7 % (ref 35.0–47.0)
Hemoglobin: 13.2 g/dL (ref 12.0–16.0)
MCH: 31.8 pg (ref 26.0–34.0)
MCHC: 35 g/dL (ref 32.0–36.0)
MCV: 90.6 fL (ref 80.0–100.0)
PLATELETS: 137 10*3/uL — AB (ref 150–440)
RBC: 4.16 MIL/uL (ref 3.80–5.20)
RDW: 13 % (ref 11.5–14.5)
WBC: 8.6 10*3/uL (ref 3.6–11.0)

## 2016-12-25 LAB — BASIC METABOLIC PANEL
Anion gap: 5 (ref 5–15)
BUN: 16 mg/dL (ref 6–20)
CHLORIDE: 106 mmol/L (ref 101–111)
CO2: 25 mmol/L (ref 22–32)
CREATININE: 0.9 mg/dL (ref 0.44–1.00)
Calcium: 8.7 mg/dL — ABNORMAL LOW (ref 8.9–10.3)
GFR calc Af Amer: >60 mL/min (ref >60)
GFR calc non Af Amer: 56 mL/min — ABNORMAL LOW (ref >60)
Glucose, Bld: 138 mg/dL — ABNORMAL HIGH (ref 65–99)
Potassium: 3.5 mmol/L (ref 3.5–5.1)
SODIUM: 136 mmol/L (ref 135–145)

## 2016-12-25 LAB — GLUCOSE, CAPILLARY: Glucose-Capillary: 107 mg/dL — ABNORMAL HIGH (ref 65–99)

## 2016-12-25 LAB — CK
Total CK: 3223 U/L — ABNORMAL HIGH (ref 38–234)
Total CK: 1533 U/L — ABNORMAL HIGH (ref 38–234)

## 2016-12-25 LAB — LACTIC ACID, PLASMA: LACTIC ACID, VENOUS: 1.2 mmol/L (ref 0.5–1.9)

## 2016-12-25 MED ORDER — BISACODYL 5 MG PO TBEC
5.0000 mg | DELAYED_RELEASE_TABLET | Freq: Every day | ORAL | Status: DC | PRN
  Administered 2016-12-26 – 2016-12-27 (×2): 5 mg via ORAL
  Filled 2016-12-25 (×2): qty 1

## 2016-12-25 MED ORDER — ACETAMINOPHEN 325 MG PO TABS
650.0000 mg | ORAL_TABLET | Freq: Four times a day (QID) | ORAL | Status: DC | PRN
  Administered 2016-12-25 – 2016-12-26 (×2): 650 mg via ORAL
  Filled 2016-12-25 (×2): qty 2

## 2016-12-25 MED ORDER — ONDANSETRON HCL 4 MG PO TABS: 4.0000 mg | ORAL_TABLET | Freq: Four times a day (QID) | ORAL | Status: DC | PRN

## 2016-12-25 MED ORDER — DEXTROSE 5 % IV SOLN
1.0000 g | INTRAVENOUS | Status: DC
  Administered 2016-12-25 – 2016-12-26 (×2): 1 g via INTRAVENOUS
  Filled 2016-12-25 (×3): qty 10

## 2016-12-25 MED ORDER — SODIUM CHLORIDE 0.9 % IV SOLN
INTRAVENOUS | Status: DC
  Administered 2016-12-25 – 2016-12-27 (×5): via INTRAVENOUS

## 2016-12-25 MED ORDER — ONDANSETRON HCL 4 MG/2ML IJ SOLN: 4.0000 mg | Freq: Four times a day (QID) | INTRAMUSCULAR | Status: DC | PRN

## 2016-12-25 MED ORDER — HYDROCODONE-ACETAMINOPHEN 5-325 MG PO TABS: 1.0000 | ORAL_TABLET | ORAL | Status: DC | PRN

## 2016-12-25 MED ORDER — DOCUSATE SODIUM 100 MG PO CAPS
100.0000 mg | ORAL_CAPSULE | Freq: Two times a day (BID) | ORAL | Status: DC
  Administered 2016-12-25 – 2016-12-29 (×5): 100 mg via ORAL
  Filled 2016-12-25 (×7): qty 1

## 2016-12-25 MED ORDER — TRAZODONE HCL 50 MG PO TABS: 25.0000 mg | ORAL_TABLET | Freq: Every evening | ORAL | Status: DC | PRN

## 2016-12-25 MED ORDER — ACETAMINOPHEN 650 MG RE SUPP: 650.0000 mg | Freq: Four times a day (QID) | RECTAL | Status: DC | PRN

## 2016-12-25 MED ORDER — HEPARIN SODIUM (PORCINE) 5000 UNIT/ML IJ SOLN
5000.0000 [IU] | Freq: Three times a day (TID) | INTRAMUSCULAR | Status: DC
  Administered 2016-12-25 – 2016-12-29 (×14): 5000 [IU] via SUBCUTANEOUS
  Filled 2016-12-25 (×15): qty 1

## 2016-12-25 NOTE — Progress Notes (Signed)
PT Cancellation Note  Patient Details Name: Jennifer Murray MRN: [id number] DOB: [birth year]   Cancelled Treatment:    Reason Eval/Treat Not Completed: Other (comment) Consult received and chart reviewed. Currently unable to evaluate/treat, as palliative care is currently consulting with pt. Will re-attempt, time permitting.     Bevelyn Ngo 12/25/2016, 3:34 PM

## 2016-12-25 NOTE — H&P (Signed)
Jennifer Murray NAME: Jennifer Murray    MR#:  [id number]  DATE OF BIRTH:  November 07, 1927  DATE OF ADMISSION:  12/24/2016  PRIMARY CARE PHYSICIAN: Guadalupe Maple, MD   REQUESTING/REFERRING PHYSICIAN: Earleen Newport, MD  CHIEF COMPLAINT:   Chief Complaint  Patient presents with  . Weakness    HISTORY OF PRESENT ILLNESS:  Jennifer Murray  is a 83 y.o. female with a known history of Chronic lymphedema, lumbar spinal stenosis is being admitted status post fall. Patient was found on the ground by her family. She did not use her life alert when she fell due to weakness. EMS arrived and found her febrile and complaining of right hip and right shoulder pain. She was on the ground for most of the night.  Patient at baseline uses a frontwheel walker and when she goes long distances.  She will use wheelchair.  She fell into the commode and pulled the string after a while as she couldn't get up.  EMS was called, who helped her get in the bed.  She was hurting in her right shoulder and buttock in those areas were x-rayed and has no acute fracture.  She was noted to have UTI and is being admitted for further evaluation and management.  Her daughter and granddaughter are at the bedside PAST MEDICAL HISTORY:   Past Medical History:  Diagnosis Date  . Acute cerebrovascular insufficiency transient focal neurologic deficit 2003  . Arthritis   . Cancer (HCC)    Basal Cell  . Diverticulosis   . Leg edema   . Lumbar spinal stenosis   . Obesity   . Osteoporosis   . Stroke [facility])     PAST SURGICAL HISTORY:   Past Surgical History:  Procedure Laterality Date  . BREAST BIOPSY Left 1975   Benign per patient  . COLONOSCOPY W/ POLYPECTOMY    . EYE SURGERY Bilateral 2016   cataract  . JOINT REPLACEMENT Left 2011   knee    SOCIAL HISTORY:   Social History  Substance Use Topics  . Smoking status: Passive Smoke Exposure - Never Smoker  . Smokeless  tobacco: Never Used  . Alcohol use No    FAMILY HISTORY:   Family History  Problem Relation Age of Onset  . Cancer Mother        colon  . Heart attack Father   . Heart attack Brother   . Diabetes Paternal Grandmother   . Cancer Paternal Grandmother        breast  . Heart attack Paternal Grandfather     DRUG ALLERGIES:   Allergies  Allergen Reactions  . Ciprocin-Fluocin-Procin [Fluocinolone Acetonide] Other (See Comments)    dizziness  . Ciprofloxacin Other (See Comments)    Dizziness.    REVIEW OF SYSTEMS:   Review of Systems  Constitutional: Positive for malaise/fatigue. Negative for chills, fever and weight loss.  HENT: Negative for nosebleeds and sore throat.   Eyes: Negative for blurred vision.  Respiratory: Negative for cough, shortness of breath and wheezing.   Cardiovascular: Positive for leg swelling. Negative for chest pain, orthopnea and PND.  Gastrointestinal: Negative for abdominal pain, constipation, diarrhea, heartburn, nausea and vomiting.  Genitourinary: Negative for dysuria and urgency.  Musculoskeletal: Positive for falls and joint pain. Negative for back pain.  Skin: Negative for rash.  Neurological: Positive for weakness. Negative for dizziness, speech change, focal weakness and headaches.  Endo/Heme/Allergies: Does not bruise/bleed easily.  Psychiatric/Behavioral: Negative for depression.   MEDICATIONS AT HOME:   Prior to Admission medications   Medication Sig Start Date End Date Taking? Authorizing Provider  B Complex Vitamins (B COMPLEX PO) Take 1 tablet by mouth daily.     [provider]  clopidogrel (PLAVIX) 75 MG tablet Take 1 tablet (75 mg total) by mouth daily. 11/04/16   Guadalupe Maple, MD  clotrimazole-betamethasone (LOTRISONE) cream Apply 1 application topically 2 (two) times daily. 04/02/16   Guadalupe Maple, MD  Multiple Vitamins-Minerals (CENTRUM SILVER ADULT 50+ PO) Take 1 tablet by mouth daily.     [provider]  Omega-3 Fatty Acids (FISH OIL BURP-LESS PO) Take 1 capsule by mouth daily.     [provider]  vitamin B-12 (CYANOCOBALAMIN) 1000 MCG tablet Take 1,000 mcg by mouth once.    [provider]      VITAL SIGNS:  Blood pressure (!) 77/53, pulse 81, temperature 99.4 F (37.4 C), temperature source Oral, resp. rate (!) 23, height 5\' 6"  (1.676 m), weight 108.9 kg (240 lb), SpO2 95 %.  PHYSICAL EXAMINATION:  Physical Exam  GENERAL:  83 y.o.-year-old patient lying in the bed with no acute distress.  EYES: Pupils equal, round, reactive to light and accommodation. No scleral icterus. Extraocular muscles intact.  HEENT: Head atraumatic, normocephalic. Oropharynx and nasopharynx clear.  NECK:  Supple, no jugular venous distention. No thyroid enlargement, no tenderness.  LUNGS: Normal breath sounds bilaterally, no wheezing, rales,rhonchi or crepitation. No use of accessory muscles of respiration.  CARDIOVASCULAR: S1, S2 normal. No murmurs, rubs, or gallops.  ABDOMEN: Soft, nontender, nondistended. Bowel sounds present. No organomegaly or mass.  EXTREMITIES: No pedal edema, cyanosis, or clubbing. She has tenderness on her right shoulder area and right hips on touch.  She has chronic lymphedema in bilateral lower extremities NEUROLOGIC: Cranial nerves II through XII are intact. Muscle strength 5/5 in all extremities except rt side was not checked due to pain Sensation intact. Gait not checked.  PSYCHIATRIC: The patient is alert and oriented x 3.  SKIN: No obvious rash, lesion, or ulcer.  LABORATORY PANEL:   CBC  Recent Labs Lab 12/24/16 2038  WBC 10.5  HGB 13.9  HCT 40.1  PLT 157   ------------------------------------------------------------------------------------------------------------------  Chemistries   Recent Labs Lab 12/24/16 2038  NA 136  K 3.5  CL 106  CO2 23  GLUCOSE 153*  BUN 19  CREATININE 0.68  CALCIUM 9.3  AST 159*  ALT 49  ALKPHOS 80    BILITOT 1.4*   ------------------------------------------------------------------------------------------------------------------  Cardiac Enzymes  Recent Labs Lab 12/24/16 2038  TROPONINI 0.03*   ------------------------------------------------------------------------------------------------------------------  RADIOLOGY:  Dg Chest 1 View  Result Date: 12/24/2016 CLINICAL DATA:  83 year old female with fall and right shoulder pain. EXAM: CHEST 1 VIEW COMPARISON:  None. FINDINGS: Minimal left lung base atelectatic changes/ scarring. No focal consolidation, pleural effusion, or pneumothorax. The cardiac silhouette is within normal limits. Osteopenia. No acute osseous pathology. IMPRESSION: No active disease. Electronically Signed   By: Anner Crete M.D.   On: 12/24/2016 21:12   Dg Pelvis 1-2 Views  Result Date: 12/24/2016 CLINICAL DATA:  Right shoulder pain and right posterior hip pain after a fall. Weakness, fever. EXAM: PELVIS - 1-2 VIEW COMPARISON:  None. FINDINGS: Diffuse bone demineralization. Pelvis appears intact. SI joints and symphysis pubis are not displaced. Degenerative changes in both hips. Suggestion of a cortical defect at the junction of the right femoral head and neck could indicate a  nondisplaced fracture. No dislocation. Degenerative changes in the lower lumbar spine. IMPRESSION: Degenerative changes in the hips. Cortical defect at the junction of the right femoral head and neck may indicate a nondisplaced fracture. Electronically Signed   By: Lucienne Capers M.D.   On: 12/24/2016 21:14   Dg Shoulder Right  Result Date: 12/24/2016 CLINICAL DATA:  83 year old female with fall and right shoulder pain. EXAM: RIGHT SHOULDER - 2+ VIEW COMPARISON:  None. FINDINGS: There is no acute fracture or dislocation. The bones are osteopenic. There is elevation of the right humeral head consistent with chronic rotator cuff injury. There are degenerative changes and spurring at the [facility]  joint. The soft tissues appear unremarkable. IMPRESSION: 1. No acute fracture or dislocation. 2. Osteopenia with degenerative changes and evidence of chronic rotator cuff injury. Electronically Signed   By: Anner Crete M.D.   On: 12/24/2016 21:14   Ct Hip Right Wo Contrast  Result Date: 12/24/2016 CLINICAL DATA:  Generalize weakness and confusion. Fell last night. Hypotension. Pain to the buttocks. Possible right hip fracture on pelvic films. EXAM: CT OF THE RIGHT HIP WITHOUT CONTRAST TECHNIQUE: Multidetector CT imaging of the right hip was performed according to the standard protocol. Multiplanar CT image reconstructions were also generated. COMPARISON:  Pelvis 12/24/2016 FINDINGS: Bones/Joint/Cartilage No evidence of acute fracture or dislocation of the right hip. Cortical changes seen on plain radiographs consistent with hypertrophic changes. Degenerative changes in the right hip with narrowing and sclerosis of the superior acetabular rim and osteophytes on the femoral side. No focal bone lesion or bone destruction. Bone cortex appears intact. A small right hip effusion is present. Ligaments Suboptimally assessed by CT. Muscles and Tendons No intramuscular hematoma or mass. Fatty infiltration of the gluteal muscles. Soft tissues Calcifications in the uterus consistent with fibroids. Visualized pelvic organs otherwise appear intact. Vascular calcifications. IMPRESSION: No evidence of acute fracture or dislocation of the right hip. Degenerative changes in the right hip. Small right hip effusion. Calcified uterine fibroids. Electronically Signed   By: Lucienne Capers M.D.   On: 12/24/2016 22:07   IMPRESSION AND PLAN:  known history of Chronic lymphedema, lumbar spinal stenosis is being admitted status post fall  * UTI - Based on UA - We will obtain urine culture - Start IV Rocephin  * Acute rhabdomyolysis - Due to fall and dehydration - CK is 3580 - Aggressive IV fluid hydration  *  Hypotension -  Her initial blood pressure in the ED was in 70s, received couple liters of fluid and her pressure is, and 120s.  Now - Continue aggressive hydration  * Right shoulder and buttock pain - Status post fall - Likely muscular strain - Consult physical and occupational therapy, may need placement    All the records are reviewed and case discussed with ED provider. Management plans discussed with the patient, family and they are in agreement.  CODE STATUS: DO NOT RESUSCITATE  TOTAL TIME TAKING CARE OF THIS PATIENT: 45 minutes.    Max Sane M.D on 12/25/2016 at 12:24 AM  Between 7am to 6pm - Pager - [phone]  After 6pm go to [email or web address] - Proofreader  [facility]  Office  [phone]  CC: Primary care physician; Guadalupe Maple, MD   Note: This dictation was prepared with Dragon dictation along with smaller phrase technology. Any transcriptional errors that result from this process are unintentional.

## 2016-12-25 NOTE — Progress Notes (Signed)
[place] [facility] [place] NAME: Jennifer Murray    MR#:  [id number]  DATE OF BIRTH:  Jul 24, 1927  SUBJECTIVE:  CHIEF COMPLAINT:   Chief Complaint  Patient presents with  . Weakness     Came with weakness, could not get up from the potty, found to have UTI. Also have rhabdomyolysis. Looks better today as per family.  REVIEW OF SYSTEMS:  CONSTITUTIONAL: No fever,positive for fatigue or weakness.  EYES: No blurred or double vision.  EARS, NOSE, AND THROAT: No tinnitus or ear pain.  RESPIRATORY: No cough, shortness of breath, wheezing or hemoptysis.  CARDIOVASCULAR: No chest pain, orthopnea, edema.  GASTROINTESTINAL: No nausea, vomiting, diarrhea or abdominal pain.  GENITOURINARY: No dysuria, hematuria.  ENDOCRINE: No polyuria, nocturia,  HEMATOLOGY: No anemia, easy bruising or bleeding SKIN: No rash or lesion. MUSCULOSKELETAL: No joint pain or arthritis.   NEUROLOGIC: No tingling, numbness, weakness.  PSYCHIATRY: No anxiety or depression.   ROS  DRUG ALLERGIES:   Allergies  Allergen Reactions  . Ciprocin-Fluocin-Procin [Fluocinolone Acetonide] Other (See Comments)    dizziness  . Ciprofloxacin Other (See Comments)    Dizziness.    VITALS:  Blood pressure 121/76, pulse 86, temperature 98 F (36.7 C), temperature source Oral, resp. rate 20, height 5\' 6"  (1.676 m), weight 108.9 kg (240 lb), SpO2 98 %.  PHYSICAL EXAMINATION:  GENERAL:  83 y.o.-year-old patient lying in the bed with no acute distress.  EYES: Pupils equal, round, reactive to light and accommodation. No scleral icterus. Extraocular muscles intact.  HEENT: Head atraumatic, normocephalic. Oropharynx and nasopharynx clear.  NECK:  Supple, no jugular venous distention. No thyroid enlargement, no tenderness.  LUNGS: Normal breath sounds bilaterally, no wheezing, rales,rhonchi or crepitation. No use of accessory muscles of respiration.  CARDIOVASCULAR: S1, S2 normal. No murmurs, rubs,  or gallops.  ABDOMEN: Soft, nontender, nondistended. Bowel sounds present. No organomegaly or mass.  EXTREMITIES: No pedal edema, cyanosis, or clubbing.  NEUROLOGIC: Cranial nerves II through XII are intact. Muscle strength 2-3/5 in all extremities. Sensation intact. Gait not checked.  PSYCHIATRIC: The patient is alert and oriented x 3.  SKIN: No obvious rash, lesion, or ulcer.   Physical Exam LABORATORY PANEL:   CBC  Recent Labs Lab 12/25/16 0235  WBC 8.6  HGB 13.2  HCT 37.7  PLT 137*   ------------------------------------------------------------------------------------------------------------------  Chemistries   Recent Labs Lab 12/24/16 2038 12/25/16 0235  NA 136 136  K 3.5 3.5  CL 106 106  CO2 23 25  GLUCOSE 153* 138*  BUN 19 16  CREATININE 0.68 0.90  CALCIUM 9.3 8.7*  AST 159*  --   ALT 49  --   ALKPHOS 80  --   BILITOT 1.4*  --    ------------------------------------------------------------------------------------------------------------------  Cardiac Enzymes  Recent Labs Lab 12/24/16 2038  TROPONINI 0.03*   ------------------------------------------------------------------------------------------------------------------  RADIOLOGY:  Dg Chest 1 View  Result Date: 12/24/2016 CLINICAL DATA:  83 year old female with fall and right shoulder pain. EXAM: CHEST 1 VIEW COMPARISON:  None. FINDINGS: Minimal left lung base atelectatic changes/ scarring. No focal consolidation, pleural effusion, or pneumothorax. The cardiac silhouette is within normal limits. Osteopenia. No acute osseous pathology. IMPRESSION: No active disease. Electronically Signed   By: Anner Crete M.D.   On: 12/24/2016 21:12   Dg Pelvis 1-2 Views  Result Date: 12/24/2016 CLINICAL DATA:  Right shoulder pain and right posterior hip pain after a fall. Weakness, fever. EXAM: PELVIS - 1-2 VIEW COMPARISON:  None. FINDINGS:  Diffuse bone demineralization. Pelvis appears intact. SI joints and  symphysis pubis are not displaced. Degenerative changes in both hips. Suggestion of a cortical defect at the junction of the right femoral head and neck could indicate a nondisplaced fracture. No dislocation. Degenerative changes in the lower lumbar spine. IMPRESSION: Degenerative changes in the hips. Cortical defect at the junction of the right femoral head and neck may indicate a nondisplaced fracture. Electronically Signed   By: Lucienne Capers M.D.   On: 12/24/2016 21:14   Dg Shoulder Right  Result Date: 12/24/2016 CLINICAL DATA:  83 year old female with fall and right shoulder pain. EXAM: RIGHT SHOULDER - 2+ VIEW COMPARISON:  None. FINDINGS: There is no acute fracture or dislocation. The bones are osteopenic. There is elevation of the right humeral head consistent with chronic rotator cuff injury. There are degenerative changes and spurring at the [facility] joint. The soft tissues appear unremarkable. IMPRESSION: 1. No acute fracture or dislocation. 2. Osteopenia with degenerative changes and evidence of chronic rotator cuff injury. Electronically Signed   By: Anner Crete M.D.   On: 12/24/2016 21:14   Ct Hip Right Wo Contrast  Result Date: 12/24/2016 CLINICAL DATA:  Generalize weakness and confusion. Fell last night. Hypotension. Pain to the buttocks. Possible right hip fracture on pelvic films. EXAM: CT OF THE RIGHT HIP WITHOUT CONTRAST TECHNIQUE: Multidetector CT imaging of the right hip was performed according to the standard protocol. Multiplanar CT image reconstructions were also generated. COMPARISON:  Pelvis 12/24/2016 FINDINGS: Bones/Joint/Cartilage No evidence of acute fracture or dislocation of the right hip. Cortical changes seen on plain radiographs consistent with hypertrophic changes. Degenerative changes in the right hip with narrowing and sclerosis of the superior acetabular rim and osteophytes on the femoral side. No focal bone lesion or bone destruction. Bone cortex appears intact. A  small right hip effusion is present. Ligaments Suboptimally assessed by CT. Muscles and Tendons No intramuscular hematoma or mass. Fatty infiltration of the gluteal muscles. Soft tissues Calcifications in the uterus consistent with fibroids. Visualized pelvic organs otherwise appear intact. Vascular calcifications. IMPRESSION: No evidence of acute fracture or dislocation of the right hip. Degenerative changes in the right hip. Small right hip effusion. Calcified uterine fibroids. Electronically Signed   By: Lucienne Capers M.D.   On: 12/24/2016 22:07    ASSESSMENT AND PLAN:   Active Problems:   UTI (urinary tract infection)   Cystitis   Goals of care, counseling/discussion   Palliative care encounter   * UTI - Based on UA - obtain urine culture - On IV Rocephin  * Acute rhabdomyolysis - Due to fall and dehydration - CK is 3580 - Aggressive IV fluid hydration  * Hypotension -  Her initial blood pressure in the ED was in 70s, received couple liters of fluid and her pressure is, and 120s.  Now - Continue aggressive hydration  * Right shoulder and buttock pain - Status post fall - Likely muscular strain, Xray are negative. - Consult physical and occupational therapy, may need placement     All the records are reviewed and case discussed with Care Management/Social Workerr. Management plans discussed with the patient, family and they are in agreement.  CODE STATUS: Full.  TOTAL TIME TAKING CARE OF THIS PATIENT: 35 minutes.   Discussed with her son and daughter in room.  POSSIBLE D/C IN 2-3 DAYS, DEPENDING ON CLINICAL CONDITION.   Vaughan Basta M.D on 12/25/2016   Between 7am to 6pm - Pager - [phone]  After 6pm  go to [email or web address] - password [email or web address]  Office  [phone]  CC: Primary care physician; Guadalupe Maple, MD  Note: This dictation was prepared with Dragon dictation along with smaller phrase technology. Any  transcriptional errors that result from this process are unintentional.

## 2016-12-25 NOTE — Plan of Care (Signed)
Pt unable to work with PT today. Palliative was meeting with Pt and family.  Not certain about d/c placement.  Red, yellow, purple arm bands in place on assessment.  Has yellow socks tied to bed.

## 2016-12-25 NOTE — Consult Note (Signed)
Consultation Note Date: 12/25/2016   Patient Name: Jennifer Murray  DOB: May 05, 1928  MRN: [id number]  Age / Sex: 83 y.o., female  PCP: Jennifer Maple, MD Referring Physician: Vaughan Murray, *  Reason for Consultation: Establishing goals of care  HPI/Patient Profile: 83 y.o. female  with past medical history of acute cerebrovascular insufficiency with transient neurologic deficit, basal cell cancer, diverticulosis, chronic lymphedema, lumbar spinal stenosis, osteoporosis admitted on 12/24/2016 with weakness and fall found by her family. Found to have UTI and acute rhabdomyolysis and right shoulder and buttocks pain from fall (no fractures found). Palliative requested to assist with GOC.   Clinical Assessment and Goals of Care: I met today with Jennifer Murray along with her daughter, Jennifer Murray, and son, Jennifer Murray, at bedside. Our main conversation was regarding DNR and Advance Directives. I explained DNR and the difference between varying levels of aggressive care but that this is not affected by DNR status. The only gray area for them that they have discussed is intubation (they explain as last night if she had required intubation and this was not done that would have been a mistake as she is doing well today). I gave them and explained MOST form to allow [place] to document and explain these wishes. They would only want intubation if her condition was thought to be reversible. DNR will stand. We also discussed HCPOA/Living Will.   Family has some concerns regarding the specifics of MOST form. I tried to explain that this is not finite and that there should ALWAYS be a discussion in specific circumstances what is expected and makes sense for Jennifer Murray. There were some varying views on artificial feeding but Jennifer Murray seemed to lean more towards no artificial feeding and possibly no intubation. There will need to be more discussion  amongst family on decisions. Overall they expressed that this conversation was helpful as they have had paperwork at home but unclear on how to interpret. Will follow up tomorrow.   Primary Decision Maker PATIENT, plans to make daughter Jennifer Murray her [place]   - DNR - May consider elective and temporary intubation depending on situation - Hopeful to complete HCPOA and Advance Directive while hospitalized  Code Status/Advance Care Planning:  DNR   Symptom Management:   Right shoulder pain acute on chronic: Chronic shoulder pain but bruised and sore after fall. Recommend NSAIDs and ice.   Palliative Prophylaxis:   Bowel Regimen, Delirium Protocol, Frequent Pain Assessment, Oral Care and Turn Reposition  Additional Recommendations (Limitations, Scope, Preferences):  Full Scope Treatment  Psycho-social/Spiritual:   Desire for further Chaplaincy support:no  Additional Recommendations: Caregiving  Support/Resources  Prognosis:   Unable to determine  Discharge Planning: To Be Determined      Primary Diagnoses: Present on Admission: . UTI (urinary tract infection)   I have reviewed the medical record, interviewed the patient and family, and examined the patient. The following aspects are pertinent.  Past Medical History:  Diagnosis Date  . Acute cerebrovascular insufficiency transient  focal neurologic deficit 2003  . Arthritis   . Cancer (HCC)    Basal Cell  . Diverticulosis   . Leg edema   . Lumbar spinal stenosis   . Obesity   . Osteoporosis   . Stroke [facility])    Social History   Social History  . Marital status: Widowed    Spouse name: N/A  . Number of children: N/A  . Years of education: N/A   Social History Main Topics  . Smoking status: Passive Smoke Exposure - Never Smoker  . Smokeless tobacco: Never Used  . Alcohol use No  . Drug use: No  . Sexual activity: No   Other Topics Concern  . None   Social History  Narrative  . None   Family History  Problem Relation Age of Onset  . Cancer Mother        colon  . Heart attack Father   . Heart attack Brother   . Diabetes Paternal Grandmother   . Cancer Paternal Grandmother        breast  . Heart attack Paternal Grandfather    Scheduled Meds: . docusate sodium  100 mg Oral BID  . heparin  5,000 Units Subcutaneous Q8H   Continuous Infusions: . sodium chloride 100 mL/hr at 12/25/16 1242  . cefTRIAXone (ROCEPHIN)  IV Stopped (12/25/16 1042)   PRN Meds:.acetaminophen **OR** acetaminophen, bisacodyl, HYDROcodone-acetaminophen, ondansetron **OR** ondansetron (ZOFRAN) IV, traZODone Allergies  Allergen Reactions  . Ciprocin-Fluocin-Procin [Fluocinolone Acetonide] Other (See Comments)    dizziness  . Ciprofloxacin Other (See Comments)    Dizziness.   Review of Systems  Constitutional: Positive for activity change and appetite change.  Neurological: Positive for weakness.    Physical Exam  Constitutional: She is oriented to person, place, and time. She appears well-developed. She appears ill.  Obese  HENT:  Head: Normocephalic and atraumatic.  Cardiovascular: Normal rate and regular rhythm.   Pulmonary/Chest: Effort normal. No accessory muscle usage. No tachypnea. No respiratory distress.  Abdominal: Normal appearance.  Neurological: She is alert and oriented to person, place, and time.  Mostly oriented but does rely heavily on children for answers and input  Psychiatric:  Somewhat labile emotions during conversation. Seemed to be crying and laughing at the same time but we were discussing EOL.   Nursing note and vitals reviewed.   Vital Signs: BP 133/70 (BP Location: Left Arm)   Pulse 84   Temp 99.7 F (37.6 C)   Resp 20   Ht '5\' 6"'$  (1.676 m)   Wt 108.9 kg (240 lb)   SpO2 97%   BMI 38.74 kg/m  Pain Assessment: 0-10   Pain Score: 0-No pain   SpO2: SpO2: 97 % O2 Device:SpO2: 97 % O2 Flow Rate: .   IO: Intake/output summary:   Intake/Output Summary (Last 24 hours) at 12/25/16 1428 Last data filed at 12/25/16 1352  Gross per 24 hour  Intake             2290 ml  Output              209 ml  Net             2081 ml    LBM: Last BM Date:  (last BM unknown) Baseline Weight: Weight: 108.9 kg (240 lb) Most recent weight: Weight: 108.9 kg (240 lb)     Palliative Assessment/Data: 30%    Time Total: 14mn  Greater than 50%  of this time was spent counseling and coordinating care  related to the above assessment and plan.  Signed by: Vinie Sill, NP Palliative Medicine Team Pager # [phone] (M-F 8a-5p) Team Phone # [phone] (Nights/Weekends)

## 2016-12-25 NOTE — Progress Notes (Signed)
Family Meeting Note  Advance Directive:yes  Today a meeting took place with the Patient and Daughter and granddaughter.  The following clinical team members were present during this meeting:MD  The following were discussed:Patient's diagnosis: , Patient's progosis: > 12 months and Goals for treatment: DNR  Additional follow-up to be provided: Palliative care c/s, Home with Palliative care v/s Hospice  Palliative care consultationme spent during discussion:20 minutes  Max Sane, MD

## 2016-12-25 NOTE — Progress Notes (Signed)
OT Cancellation Note  Patient Details Name: Jennifer Murray MRN: [id number] DOB: July 13, 1927   Cancelled Treatment:    Reason Eval/Treat Not Completed: Patient at procedure or test/ unavailable. Order received, chart reviewed. Upon attempt, pt meeting with palliative care team member. Will re-attempt OT evaluation next date as appropriate.  Jeni Salles, MPH, MS, OTR/L ascom [phone] 12/25/16, 3:37 PM

## 2016-12-26 ENCOUNTER — Inpatient Hospital Stay: Payer: Medicare Other

## 2016-12-26 DIAGNOSIS — W19XXXA Unspecified fall, initial encounter: Secondary | ICD-10-CM

## 2016-12-26 LAB — BLOOD CULTURE ID PANEL (REFLEXED)
Acinetobacter baumannii: NOT DETECTED
CANDIDA PARAPSILOSIS: NOT DETECTED
CANDIDA TROPICALIS: NOT DETECTED
Candida albicans: NOT DETECTED
Candida glabrata: NOT DETECTED
Candida krusei: NOT DETECTED
Carbapenem resistance: NOT DETECTED
Enterobacter cloacae complex: NOT DETECTED
Enterobacteriaceae species: DETECTED — AB
Enterococcus species: NOT DETECTED
Escherichia coli: NOT DETECTED
HAEMOPHILUS INFLUENZAE: NOT DETECTED
KLEBSIELLA PNEUMONIAE: DETECTED — AB
Klebsiella oxytoca: NOT DETECTED
Listeria monocytogenes: NOT DETECTED
NEISSERIA MENINGITIDIS: NOT DETECTED
PROTEUS SPECIES: NOT DETECTED
Pseudomonas aeruginosa: NOT DETECTED
SERRATIA MARCESCENS: NOT DETECTED
STAPHYLOCOCCUS SPECIES: NOT DETECTED
STREPTOCOCCUS AGALACTIAE: NOT DETECTED
STREPTOCOCCUS SPECIES: NOT DETECTED
Staphylococcus aureus (BCID): NOT DETECTED
Streptococcus pneumoniae: NOT DETECTED
Streptococcus pyogenes: NOT DETECTED

## 2016-12-26 LAB — CBC
HCT: 33.8 % — ABNORMAL LOW (ref 35.0–47.0)
HEMOGLOBIN: 12 g/dL (ref 12.0–16.0)
MCH: 33.4 pg (ref 26.0–34.0)
MCHC: 35.4 g/dL (ref 32.0–36.0)
MCV: 94.5 fL (ref 80.0–100.0)
PLATELETS: 120 10*3/uL — AB (ref 150–440)
RBC: 3.58 MIL/uL — AB (ref 3.80–5.20)
RDW: 13 % (ref 11.5–14.5)
WBC: 5.4 10*3/uL (ref 3.6–11.0)

## 2016-12-26 LAB — BASIC METABOLIC PANEL
Anion gap: 3 — ABNORMAL LOW (ref 5–15)
BUN: 8 mg/dL (ref 6–20)
CALCIUM: 8.6 mg/dL — AB (ref 8.9–10.3)
CO2: 24 mmol/L (ref 22–32)
CREATININE: 0.54 mg/dL (ref 0.44–1.00)
Chloride: 110 mmol/L (ref 101–111)
Glucose, Bld: 116 mg/dL — ABNORMAL HIGH (ref 65–99)
Potassium: 3.2 mmol/L — ABNORMAL LOW (ref 3.5–5.1)
SODIUM: 137 mmol/L (ref 135–145)

## 2016-12-26 LAB — CK
CK TOTAL: 745 U/L — AB (ref 38–234)
CK TOTAL: 506 U/L — AB (ref 38–234)

## 2016-12-26 LAB — MAGNESIUM: MAGNESIUM: 1.7 mg/dL (ref 1.7–2.4)

## 2016-12-26 LAB — GLUCOSE, CAPILLARY: GLUCOSE-CAPILLARY: 109 mg/dL — AB (ref 65–99)

## 2016-12-26 MED ORDER — POTASSIUM CHLORIDE CRYS ER 20 MEQ PO TBCR
20.0000 meq | EXTENDED_RELEASE_TABLET | Freq: Two times a day (BID) | ORAL | Status: DC
  Administered 2016-12-26 – 2016-12-29 (×7): 20 meq via ORAL
  Filled 2016-12-26 (×7): qty 1

## 2016-12-26 MED ORDER — CEFTRIAXONE SODIUM 1 G IJ SOLR
1.0000 g | Freq: Once | INTRAMUSCULAR | Status: AC
  Administered 2016-12-26: 19:00:00 1 g via INTRAVENOUS
  Filled 2016-12-26: qty 10

## 2016-12-26 MED ORDER — DEXTROSE 5 % IV SOLN
2.0000 g | INTRAVENOUS | Status: DC
  Administered 2016-12-27 – 2016-12-28 (×2): 2 g via INTRAVENOUS
  Filled 2016-12-26 (×2): qty 2

## 2016-12-26 NOTE — Evaluation (Signed)
Physical Therapy Evaluation Patient Details Name: Jennifer Murray MRN: [id number] DOB: [birth year] Today's Date: 12/26/2016   History of Present Illness  Pt is an 83 yo F, admitted to acute care on 8/1 with dx of UTI. Prior to admission, pt ModI with all ADL's,  using manual WC for longer distances and amb with RW short distances. PMH: arthritis, cancer, diverticulosis, leg edema, lumbar spinal stenosis, obesity, OP, and stroke.   Clinical Impression  Pt is pleasant and willing to participate. Pt performs bed mobility with maxA to come to sit and Max +2 to return to supine. Transfers and amb not performed due to safety concerns, as pt was modA to maintain sitting balance at EOB. Pt with no reports of dizziness. Pt primarily limited by the following deficits: strength, endurance, balance, and sequencing. Overall, pt responded well to today's treatment with no adverse affects. Pt would benefit from skilled PT to address the previously mentioned impairments and promote return to PLOF. Given the previously mentioned impairments, currently recommending SNF, pending d/c.      Follow Up Recommendations SNF    Equipment Recommendations  None recommended by PT    Recommendations for Other Services       Precautions / Restrictions Precautions Precautions: Fall Restrictions Weight Bearing Restrictions: No      Mobility  Bed Mobility Overal bed mobility: Needs Assistance Bed Mobility: Supine to Sit;Sit to Supine     Supine to sit: Max assist Sit to supine: Max assist;+2 for physical assistance   General bed mobility comments: Pt Max assist for supine to sit and MaxA +2 for sit to supine, requiring mod verbal cues for mechancis and safety.   Transfers                 General transfer comment: Not performed due to safety concerns.   Ambulation/Gait             General Gait Details: Not performed due to safety concerns.   Stairs            Wheelchair Mobility     Modified Rankin (Stroke Patients Only)       Balance Overall balance assessment: Needs assistance Sitting-balance support: Bilateral upper extremity supported;Feet supported Sitting balance-Leahy Scale: Poor Sitting balance - Comments: Pt with poor sitting balance, requriing ModA to maintain balance, with increased postural sway in all directions, but greatest to the R (likely due to crease in bed). Pt unable to correct with max cues.                                      Pertinent Vitals/Pain Pain Assessment: No/denies pain    Home Living Family/patient expects to be discharged to:: Private residence Living Arrangements: Other relatives Available Help at Discharge: Family Type of Home: House Home Access: Stairs to enter Entrance Stairs-Rails: Right;Left;Can reach both Entrance Stairs-Number of Steps: 5 steps Home Layout: Multi-level;Able to live on main level with bedroom/bathroom Home Equipment: Gilford Rile - 2 wheels;Wheelchair - manual      Prior Function Level of Independence: Needs assistance         Comments: Pt lives with grandchildren who assist her prn. Pt in [place] when going long distances, and uses RW for short distances.      Hand Dominance        Extremity/Trunk Assessment   Upper Extremity Assessment Upper Extremity Assessment: Overall WFL for tasks assessed  Lower Extremity Assessment Lower Extremity Assessment: Generalized weakness (MMT to R LE grossly 4/5 and 3-/5 to L LE. )       Communication   Communication: No difficulties  Cognition Arousal/Alertness: Awake/alert Behavior During Therapy: WFL for tasks assessed/performed Overall Cognitive Status: Within Functional Limits for tasks assessed                                        General Comments      Exercises Other Exercises Other Exercises: Supine therex performed to B LE's with supervision x10 reps: ankle pumps, quad sets, glute sets, SLR (ModA on L  LE), and hip abd (minA on L LE). Pt requires min cues for mechancis.    Assessment/Plan    PT Assessment Patient needs continued PT services  PT Problem List Decreased strength;Decreased activity tolerance;Decreased balance;Decreased mobility;Decreased coordination;Decreased safety awareness       PT Treatment Interventions DME instruction;Gait training;Functional mobility training;Therapeutic activities;Therapeutic exercise;Balance training;Neuromuscular re-education;Patient/family education;Manual techniques    PT Goals (Current goals can be found in the Care Plan section)  Acute Rehab PT Goals Patient Stated Goal: To return home PT Goal Formulation: With patient Time For Goal Achievement: 01/09/17 Potential to Achieve Goals: Good    Frequency Min 2X/week   Barriers to discharge        Co-evaluation               AM-PAC PT "6 Clicks" Daily Activity  Outcome Measure Difficulty turning over in bed (including adjusting bedclothes, sheets and blankets)?: Total Difficulty moving from lying on back to sitting on the side of the bed? : Total Difficulty sitting down on and standing up from a chair with arms (e.g., wheelchair, bedside commode, etc,.)?: Total Help needed moving to and from a bed to chair (including a wheelchair)?: Total Help needed walking in hospital room?: Total Help needed climbing 3-5 steps with a railing? : Total 6 Click Score: 6    End of Session Equipment Utilized During Treatment: Gait belt Activity Tolerance: Patient tolerated treatment well Patient left: in bed;with call bell/phone within reach;with bed alarm set;with family/visitor present Nurse Communication: Mobility status PT Visit Diagnosis: Unsteadiness on feet (R26.81);Other abnormalities of gait and mobility (R26.89);Muscle weakness (generalized) (M62.81);Pain;History of falling (Z91.81)    Time: 6861-6837 PT Time Calculation (min) (ACUTE ONLY): 29 min   Charges:         PT G Codes:         Oran Rein PT, SPT  Bevelyn Ngo 12/26/2016, 3:08 PM

## 2016-12-26 NOTE — Progress Notes (Signed)
[facility] received a page for pt who requested for AD. CH met with pt and daughter who was at bedside. Pt had already filled out the AD, [place] contacted the [employer] and 2 witness and had pt complete the AD. CH gave original AD and 2 copies to pt, and offered prayers for healing to pt and her family.   12/26/16 1500  Clinical Encounter Type  Visited With Patient and family together  Visit Type Initial;Spiritual support  Referral From Nurse  Consult/Referral To [place];Other (Comment)

## 2016-12-26 NOTE — Evaluation (Signed)
Occupational Therapy Evaluation Patient Details Name: Jennifer Murray MRN: [id number] DOB: Sep 23, 1927 Today's Date: 12/26/2016    History of Present Illness Pt. is an 83 y.o. female who was admitted to [facility] with UTI, Acute Rhabdomyolysis, and weakness after not being able to get up from the toilet.  Pt. PMHx includes: Right shoulder, and buttock pain, obesity, Diverticulosis, Lumbar spinal stenosis, CVA Acute cerebraovascular transient focal neurological deficit.     Clinical Impression   Pt. Is an 83 y.o. Female who was admitted to [facility] with UTI, Acute Rhabdomyolysis, and weakness. Pt. Presents with weakness, pain, limited biltateral shoulder ROM, and impaired functional mobility which hinders his ability to complete ADL, and IADL functioning. Pt. was seen for a bedside eval. Pt. requires assist for All ADLs at bed level. Pt. education was provided about positioning, and positioning items within reach secondary to bilateral shoulder restrictions. Pt. could benefit from skilled OT services to improve ADL, and IADL functioning. Pt. Could benefit from SNF level of care upon discharge, with follow-up OT services.  Follow Up Recommendations  SNF    Equipment Recommendations       Recommendations for Other Services       Precautions / Restrictions Precautions Precautions: Fall Restrictions Weight Bearing Restrictions: No                                                    ADL either performed or assessed with clinical judgement   ADL Overall ADL's : Needs assistance/impaired Eating/Feeding: Set up;Minimal assistance;Bed level   Grooming: Set up;Minimal assistance;Bed level   Upper Body Bathing: Set up;Maximal assistance   Lower Body Bathing: Maximal assistance   Upper Body Dressing : Maximal assistance   Lower Body Dressing: Maximal assistance               Functional mobility during ADLs: Total assistance General ADL Comments: Pt. education was provided  about A/E use for LE ADLs. Education was provided verbally, and through visual demonstration.     Vision         Perception     Praxis      Pertinent Vitals/Pain Pain Assessment: 0-10 Pain Score: 5  Pain Location: right shoulder Pain Intervention(s): Limited activity within patient's tolerance;Repositioned;Monitored during session     Hand Dominance Right   Extremity/Trunk Assessment Upper Extremity Assessment Upper Extremity Assessment: Generalized weakness (Impaired bilateral shoulder ROM)           Communication Communication Communication: No difficulties   Cognition Arousal/Alertness: Awake/alert Behavior During Therapy: WFL for tasks assessed/performed Overall Cognitive Status: Within Functional Limits for tasks assessed                                     General Comments       Exercises     Shoulder Instructions      Home Living Family/patient expects to be discharged to:: Private residence Living Arrangements: Other relatives (grandson, and Curator.) Available Help at Discharge: Family Type of Home: House Home Access: Stairs to enter Technical brewer of Steps: 5 steps Entrance Stairs-Rails: Right Home Layout: Multi-level     Bathroom Shower/Tub: Occupational psychologist: Standard Bathroom Accessibility: Yes   Home Equipment: Shower seat - built in;Grab bars - tub/shower;Hand held  shower head;Walker - 2 wheels          Prior Functioning/Environment Level of Independence: Needs assistance    ADL's / Homemaking Assistance Needed: Pt. has personal home aide 2-3 times a week to assist with showers, morning care , and light meal preparation. Pt. was able to manage own medicine. Grandaughter, and grandson assist with driving, and meals.             OT Problem List:        OT Treatment/Interventions: Self-care/ADL training;Therapeutic activities;DME and/or AE instruction;Patient/family  education;Visual/perceptual remediation/compensation;Therapeutic exercise;Neuromuscular education    OT Goals(Current goals can be found in the care plan section) Acute Rehab OT Goals Patient Stated Goal: To return home OT Goal Formulation: With patient Potential to Achieve Goals: Fair  OT Frequency: Min 2X/week   Barriers to D/C:            Co-evaluation              AM-PAC PT "6 Clicks" Daily Activity     Outcome Measure Help from another person eating meals?: A Little Help from another person taking care of personal grooming?: A Little Help from another person toileting, which includes using toliet, bedpan, or urinal?: A Lot Help from another person bathing (including washing, rinsing, drying)?: A Lot Help from another person to put on and taking off regular upper body clothing?: A Lot Help from another person to put on and taking off regular lower body clothing?: A Lot 6 Click Score: 14   End of Session    Activity Tolerance: Patient tolerated treatment well Patient left: in bed                   Time: 0940-1010 OT Time Calculation (min): 30 min Charges:  OT General Charges $OT Visit: 1 Procedure OT Evaluation $OT Eval Moderate Complexity: 1 Procedure G-Codes:     Harrel Carina, MS, OTR/L   Harrel Carina, MS, OTR/L 12/26/2016, 10:58 AM

## 2016-12-26 NOTE — Progress Notes (Signed)
Purewick external catheter changed early this shift.  Red, yellow and maroon armbands in place.  Pt rested comfortably.  All needs met.

## 2016-12-26 NOTE — Progress Notes (Signed)
[facility] NAME: Jennifer Murray    MR#:  [id number]  DATE OF BIRTH:  Jul 13, 1927  SUBJECTIVE:  CHIEF COMPLAINT:   Chief Complaint  Patient presents with  . Weakness     Came with weakness, could not get up from the potty, found to have UTI. Also have rhabdomyolysis. Looks Better and more energetic today.  REVIEW OF SYSTEMS:  CONSTITUTIONAL: No fever,positive for fatigue or weakness.  EYES: No blurred or double vision.  EARS, NOSE, AND THROAT: No tinnitus or ear pain.  RESPIRATORY: No cough, shortness of breath, wheezing or hemoptysis.  CARDIOVASCULAR: No chest pain, orthopnea, edema.  GASTROINTESTINAL: No nausea, vomiting, diarrhea or abdominal pain.  GENITOURINARY: No dysuria, hematuria.  ENDOCRINE: No polyuria, nocturia,  HEMATOLOGY: No anemia, easy bruising or bleeding SKIN: No rash or lesion. MUSCULOSKELETAL: No joint pain or arthritis.   NEUROLOGIC: No tingling, numbness, weakness.  PSYCHIATRY: No anxiety or depression.   ROS  DRUG ALLERGIES:   Allergies  Allergen Reactions  . Ciprocin-Fluocin-Procin [Fluocinolone Acetonide] Other (See Comments)    dizziness  . Ciprofloxacin Other (See Comments)    Dizziness.    VITALS:  Blood pressure (!) 119/49, pulse (!) 103, temperature 98.3 F (36.8 C), temperature source Oral, resp. rate 20, height 5\' 6"  (1.676 m), weight 108.9 kg (240 lb), SpO2 97 %.  PHYSICAL EXAMINATION:  GENERAL:  83 y.o.-year-old patient lying in the bed with no acute distress.  EYES: Pupils equal, round, reactive to light and accommodation. No scleral icterus. Extraocular muscles intact.  HEENT: Head atraumatic, normocephalic. Oropharynx and nasopharynx clear.  NECK:  Supple, no jugular venous distention. No thyroid enlargement, no tenderness.  LUNGS: Normal breath sounds bilaterally, no wheezing, no crepitation. No use of accessory muscles of respiration.  CARDIOVASCULAR: S1, S2 normal. No murmurs,  rubs, or gallops.  ABDOMEN: Soft, nontender, nondistended. Bowel sounds present. No organomegaly or mass.  EXTREMITIES: No pedal edema, cyanosis, or clubbing. On the right shoulder there is some bruise present. NEUROLOGIC: Cranial nerves II through XII are intact. Muscle strength 2-3/5 in all extremities. Sensation intact. Gait not checked.  PSYCHIATRIC: The patient is alert and oriented x 3.  SKIN: No obvious rash, lesion, or ulcer.   Physical Exam LABORATORY PANEL:   CBC  Recent Labs Lab 12/26/16 0449  WBC 5.4  HGB 12.0  HCT 33.8*  PLT 120*   ------------------------------------------------------------------------------------------------------------------  Chemistries   Recent Labs Lab 12/24/16 2038  12/26/16 0449  NA 136  < > 137  K 3.5  < > 3.2*  CL 106  < > 110  CO2 23  < > 24  GLUCOSE 153*  < > 116*  BUN 19  < > 8  CREATININE 0.68  < > 0.54  CALCIUM 9.3  < > 8.6*  AST 159*  --   --   ALT 49  --   --   ALKPHOS 80  --   --   BILITOT 1.4*  --   --   < > = values in this interval not displayed. ------------------------------------------------------------------------------------------------------------------  Cardiac Enzymes  Recent Labs Lab 12/24/16 2038  TROPONINI 0.03*   ------------------------------------------------------------------------------------------------------------------  RADIOLOGY:  Dg Chest 1 View  Result Date: 12/24/2016 CLINICAL DATA:  83 year old female with fall and right shoulder pain. EXAM: CHEST 1 VIEW COMPARISON:  None. FINDINGS: Minimal left lung base atelectatic changes/ scarring. No focal consolidation, pleural effusion, or pneumothorax. The cardiac silhouette is within normal limits. Osteopenia. No acute osseous  pathology. IMPRESSION: No active disease. Electronically Signed   By: Anner Crete M.D.   On: 12/24/2016 21:12   Dg Pelvis 1-2 Views  Result Date: 12/24/2016 CLINICAL DATA:  Right shoulder pain and right posterior hip  pain after a fall. Weakness, fever. EXAM: PELVIS - 1-2 VIEW COMPARISON:  None. FINDINGS: Diffuse bone demineralization. Pelvis appears intact. SI joints and symphysis pubis are not displaced. Degenerative changes in both hips. Suggestion of a cortical defect at the junction of the right femoral head and neck could indicate a nondisplaced fracture. No dislocation. Degenerative changes in the lower lumbar spine. IMPRESSION: Degenerative changes in the hips. Cortical defect at the junction of the right femoral head and neck may indicate a nondisplaced fracture. Electronically Signed   By: Lucienne Capers M.D.   On: 12/24/2016 21:14   Dg Shoulder Right  Result Date: 12/24/2016 CLINICAL DATA:  83 year old female with fall and right shoulder pain. EXAM: RIGHT SHOULDER - 2+ VIEW COMPARISON:  None. FINDINGS: There is no acute fracture or dislocation. The bones are osteopenic. There is elevation of the right humeral head consistent with chronic rotator cuff injury. There are degenerative changes and spurring at the [facility] joint. The soft tissues appear unremarkable. IMPRESSION: 1. No acute fracture or dislocation. 2. Osteopenia with degenerative changes and evidence of chronic rotator cuff injury. Electronically Signed   By: Anner Crete M.D.   On: 12/24/2016 21:14   Ct Hip Right Wo Contrast  Result Date: 12/24/2016 CLINICAL DATA:  Generalize weakness and confusion. Fell last night. Hypotension. Pain to the buttocks. Possible right hip fracture on pelvic films. EXAM: CT OF THE RIGHT HIP WITHOUT CONTRAST TECHNIQUE: Multidetector CT imaging of the right hip was performed according to the standard protocol. Multiplanar CT image reconstructions were also generated. COMPARISON:  Pelvis 12/24/2016 FINDINGS: Bones/Joint/Cartilage No evidence of acute fracture or dislocation of the right hip. Cortical changes seen on plain radiographs consistent with hypertrophic changes. Degenerative changes in the right hip with narrowing  and sclerosis of the superior acetabular rim and osteophytes on the femoral side. No focal bone lesion or bone destruction. Bone cortex appears intact. A small right hip effusion is present. Ligaments Suboptimally assessed by CT. Muscles and Tendons No intramuscular hematoma or mass. Fatty infiltration of the gluteal muscles. Soft tissues Calcifications in the uterus consistent with fibroids. Visualized pelvic organs otherwise appear intact. Vascular calcifications. IMPRESSION: No evidence of acute fracture or dislocation of the right hip. Degenerative changes in the right hip. Small right hip effusion. Calcified uterine fibroids. Electronically Signed   By: Lucienne Capers M.D.   On: 12/24/2016 22:07    ASSESSMENT AND PLAN:   Active Problems:   UTI (urinary tract infection)   Cystitis   Goals of care, counseling/discussion   Palliative care encounter   * UTI - Based on UA - Related sensitivity result on urine culture- Klebsiella - On IV Rocephin  * Acute rhabdomyolysis - Due to fall and dehydration - CK is 3580> now 745 - Aggressive IV fluid hydration  * Hypotension -  Her initial blood pressure in the ED was in 70s, received couple liters of fluid and her pressure is, and 120s.  Now - Continue aggressive hydration   BP is stable now.  * Right shoulder and buttock pain - Status post fall - Likely muscular strain, Xray are negative. - Consult physical and occupational therapy, need rehab placement - ice pack on shoulder and hip for pain.  * hypokalemia   Replace oral.  All the records are reviewed and case discussed with Care Management/Social Workerr. Management plans discussed with the patient, family and they are in agreement.  CODE STATUS: Full.  TOTAL TIME TAKING CARE OF THIS PATIENT: 35 minutes.   Discussed with her daughter in room.  POSSIBLE D/C IN 2-3 DAYS, DEPENDING ON CLINICAL CONDITION.   Vaughan Basta M.D on 12/26/2016   Between 7am to 6pm  - Pager - [phone]  After 6pm go to [email or web address] - password [email or web address] [facility]  Office  [phone]  CC: Primary care physician; Guadalupe Maple, MD  Note: This dictation was prepared with Dragon dictation along with smaller phrase technology. Any transcriptional errors that result from this process are unintentional.

## 2016-12-26 NOTE — Progress Notes (Signed)
Daily Progress Note   Patient Name: Jennifer Murray       Date: 12/26/2016 DOB: November 03, 1927  Age: 83 y.o. MRN#: [id number] Attending Physician: Vaughan Basta, * Primary Care Physician: Guadalupe Maple, MD Admit Date: 12/24/2016  Reason for Consultation/Follow-up: Establishing goals of care  Subjective: Ms. Bieser is feeling better today. Daughter, Limmie Patricia is at bedside.   Length of Stay: 1  Current Medications: Scheduled Meds:  . docusate sodium  100 mg Oral BID  . heparin  5,000 Units Subcutaneous Q8H  . potassium chloride  20 mEq Oral BID    Continuous Infusions: . sodium chloride 50 mL/hr at 12/26/16 1147  . cefTRIAXone (ROCEPHIN)  IV Stopped (12/26/16 1218)    PRN Meds: acetaminophen **OR** acetaminophen, bisacodyl, HYDROcodone-acetaminophen, ondansetron **OR** ondansetron (ZOFRAN) IV, traZODone  Physical Exam     Constitutional: She is oriented to person, place, and time. She appears well-developed. She appears ill.  Obese  HENT:  Head: Normocephalic and atraumatic.  Cardiovascular: Normal rate and regular rhythm.   Pulmonary/Chest: Effort normal. No accessory muscle usage. No tachypnea. No respiratory distress.  Abdominal: Normal appearance.  Neurological: She is alert and oriented to person, place, and time.  Mostly oriented but does rely heavily on children for answers and input  Nursing note and vitals reviewed.    Vital Signs: BP (!) 119/49 (BP Location: Left Arm)   Pulse (!) 103   Temp 98.3 F (36.8 C) (Oral)   Resp 20   Ht 5' 6"  (1.676 m)   Wt 108.9 kg (240 lb)   SpO2 97%   BMI 38.74 kg/m  SpO2: SpO2: 97 % O2 Device: O2 Device: Not Delivered O2 Flow Rate: O2 Flow Rate (L/min): 0 L/min  Intake/output summary:  Intake/Output Summary (Last 24 hours)  at 12/26/16 1429 Last data filed at 12/26/16 0800  Gross per 24 hour  Intake             2979 ml  Output             1200 ml  Net             1779 ml   LBM: Last BM Date: 12/25/16 Baseline Weight: Weight: 108.9 kg (240 lb) Most recent weight: Weight: 108.9 kg (240 lb)       Palliative Assessment/Data: 30%  Patient Active Problem List   Diagnosis Date Noted  . UTI (urinary tract infection) 12/25/2016  . Cystitis   . Goals of care, counseling/discussion   . Palliative care encounter   . Senile purpura ([place]) 11/04/2016  . Acute cerebrovascular insufficiency transient focal neurologic deficit   . Arthritis   . Cancer ([place])   . Diverticulosis   . Obesity   . Osteoporosis   . Acute cholecystitis 09/19/2016  . Candidal intertrigo 04/02/2016  . Advanced directives, counseling/discussion 04/02/2016  . Primary osteoarthritis of both shoulders 04/04/2015  . Essential hypertension 04/02/2015  . Lumbar spinal stenosis 04/02/2015  . Leg edema 04/02/2015  . LVH (left ventricular hypertrophy) due to hypertensive disease, without heart failure 01/24/2015  . Venous insufficiency of both lower extremities 01/10/2015  . Shortness of breath 12/25/2014  . Atherosclerosis of both carotid arteries 12/25/2014    Palliative Care Assessment & Plan   HPI: 83 y.o. female  with past medical history of acute cerebrovascular insufficiency with transient neurologic deficit, basal cell cancer, diverticulosis, chronic lymphedema, lumbar spinal stenosis, osteoporosis admitted on 12/24/2016 with weakness and fall found by her family. Found to have UTI and acute rhabdomyolysis and right shoulder and buttocks pain from fall (no fractures found). Palliative requested to assist with GOC.    Assessment: I met again with Ms. Rhett and her LouRae. We discussed further questions regarding Advance Directives. We completed MOST form: DNR, limited interventions (no intubation desired) with return to hospital,  give antibiotics if indicated, give IVF if indicated, no feeding tubes (short term via nose nor PEG). Will call chaplain to assist with completion of HCPOA. Made copies of MOST form for family and showed completed DNR that will also go with Ms. Flinchbaugh. LouRae also said that she read Hard Choices booklet last night and that this was very helpful to her. All questions and concerns addressed. Ms. Cowman feels confident in her decisions.   Recommendations/Plan:  Right shoulder pain acute on chronic: Chronic shoulder pain but bruised and sore after fall. Recommend NSAIDs and ice.    Code Status:  DNR  Prognosis:   Unable to determine  Discharge Planning:  SNF for rehab   Thank you for allowing the Palliative Medicine Team to assist in the care of this patient.   Total Time 57mn Prolonged Time Billed  no       Greater than 50%  of this time was spent counseling and coordinating care related to the above assessment and plan.  AVinie Sill NP Palliative Medicine Team Pager # [phone](M-F 8a-5p) Team Phone # [phone](Nights/Weekends)

## 2016-12-26 NOTE — Progress Notes (Signed)
PHARMACY - PHYSICIAN COMMUNICATION CRITICAL VALUE ALERT - BLOOD CULTURE IDENTIFICATION (BCID)  Results for orders placed or performed during the hospital encounter of 12/24/16  Blood Culture ID Panel (Reflexed) (Collected: 12/24/2016  8:39 PM)  Result Value Ref Range   Enterococcus species NOT DETECTED NOT DETECTED   Listeria monocytogenes NOT DETECTED NOT DETECTED   Staphylococcus species NOT DETECTED NOT DETECTED   Staphylococcus aureus NOT DETECTED NOT DETECTED   Streptococcus species NOT DETECTED NOT DETECTED   Streptococcus agalactiae NOT DETECTED NOT DETECTED   Streptococcus pneumoniae NOT DETECTED NOT DETECTED   Streptococcus pyogenes NOT DETECTED NOT DETECTED   Acinetobacter baumannii NOT DETECTED NOT DETECTED   Enterobacteriaceae species DETECTED (A) NOT DETECTED   Enterobacter cloacae complex NOT DETECTED NOT DETECTED   Escherichia coli NOT DETECTED NOT DETECTED   Klebsiella oxytoca NOT DETECTED NOT DETECTED   Klebsiella pneumoniae DETECTED (A) NOT DETECTED   Proteus species NOT DETECTED NOT DETECTED   Serratia marcescens NOT DETECTED NOT DETECTED   Carbapenem resistance NOT DETECTED NOT DETECTED   Haemophilus influenzae NOT DETECTED NOT DETECTED   Neisseria meningitidis NOT DETECTED NOT DETECTED   Pseudomonas aeruginosa NOT DETECTED NOT DETECTED   Candida albicans NOT DETECTED NOT DETECTED   Candida glabrata NOT DETECTED NOT DETECTED   Candida krusei NOT DETECTED NOT DETECTED   Candida parapsilosis NOT DETECTED NOT DETECTED   Candida tropicalis NOT DETECTED NOT DETECTED    Name of physician (or Provider) Contacted: Dr. Verdell Carmine  Changes to prescribed antibiotics required: Increase rocephin to 2 gm IV Q24H.   Laural Benes, Pharm.D., BCPS Clinical Pharmacist 12/26/2016  6:26 PM

## 2016-12-27 LAB — CK
Total CK: 309 U/L — ABNORMAL HIGH (ref 38–234)
Total CK: 225 U/L (ref 38–234)

## 2016-12-27 LAB — URINE CULTURE: Culture: >=100000 — AB

## 2016-12-27 LAB — BASIC METABOLIC PANEL
ANION GAP: 4 — AB (ref 5–15)
BUN: 6 mg/dL (ref 6–20)
CALCIUM: 8.7 mg/dL — AB (ref 8.9–10.3)
CHLORIDE: 109 mmol/L (ref 101–111)
CO2: 27 mmol/L (ref 22–32)
Creatinine, Ser: 0.44 mg/dL (ref 0.44–1.00)
GFR calc non Af Amer: >60 mL/min (ref >60)
GLUCOSE: 117 mg/dL — AB (ref 65–99)
Potassium: 3.4 mmol/L — ABNORMAL LOW (ref 3.5–5.1)
Sodium: 140 mmol/L (ref 135–145)

## 2016-12-27 LAB — GLUCOSE, CAPILLARY: GLUCOSE-CAPILLARY: 130 mg/dL — AB (ref 65–99)

## 2016-12-27 NOTE — Progress Notes (Signed)
Physical Therapy Treatment Patient Details Name: Jennifer Murray MRN: [id number] DOB: 1928-04-22 Today's Date: 12/27/2016    History of Present Illness Pt is an 83 yo F, admitted to acute care on 8/1 with dx of UTI. Prior to admission, pt ModI with all ADL's,  using manual WC for longer distances and amb with RW short distances. PMH: arthritis, cancer, diverticulosis, leg edema, lumbar spinal stenosis, obesity, OP, and stroke.     PT Comments    Pt with nursing care upon arrival.  Agrees to attempt out of bed this am.  Voices fear of falling several times during session.  She was able to get to edge of bed with mod a x 2.  Unable to scoot forward to edge of bed.  Sitting requires min assist x 1 to prevent LOB with frequent verbal cues to correct posture.  She was however able to stand x 1 at edge of bed with mod a x 2 to stand and min a x 2 once upright.  Posture remained flexed.  She was able to stand for 30 seconds before needing to sit.  She is unable to move her LE's to step in place or safely transfer to recliner at bedside.  Pt declined further session as her minister arrived and she chose to visit. Max a X 2  to return to supine and reposition in bed.   Follow Up Recommendations  SNF     Equipment Recommendations  None recommended by PT    Recommendations for Other Services       Precautions / Restrictions Precautions Precautions: Fall Restrictions Weight Bearing Restrictions: No    Mobility  Bed Mobility Overal bed mobility: Needs Assistance Bed Mobility: Rolling Rolling: Mod assist   Supine to sit: Mod assist Sit to supine: Max assist;+2 for physical assistance      Transfers Overall transfer level: Needs assistance Equipment used: Rolling walker (2 wheeled) Transfers: Sit to/from Stand Sit to Stand: Mod assist;+2 physical assistance         General transfer comment: able to stand fully with flexed posture at bedside for 30 seconds.  Unable to  step.  Ambulation/Gait             General Gait Details: Not performed due to safety concerns.    Stairs            Wheelchair Mobility    Modified Rankin (Stroke Patients Only)       Balance Overall balance assessment: Needs assistance Sitting-balance support: Feet supported;Bilateral upper extremity supported Sitting balance-Leahy Scale: Poor Sitting balance - Comments: unsafe to be left unattended, due to postural sway. Postural control: Posterior lean;Right lateral lean Standing balance support: Bilateral upper extremity supported Standing balance-Leahy Scale: Poor                              Cognition Arousal/Alertness: Awake/alert Behavior During Therapy: WFL for tasks assessed/performed Overall Cognitive Status: Within Functional Limits for tasks assessed                                        Exercises      General Comments        Pertinent Vitals/Pain Pain Assessment: 0-10 Pain Score: 5  Pain Location: right shoulder, left hip  Pain Descriptors / Indicators: Sore Pain Intervention(s): Limited activity within patient's tolerance;Monitored during  session    Home Living                      Prior Function            PT Goals (current goals can now be found in the care plan section) Progress towards PT goals: Progressing toward goals    Frequency    Min 2X/week      PT Plan Current plan remains appropriate    Co-evaluation              AM-PAC PT "6 Clicks" Daily Activity  Outcome Measure  Difficulty turning over in bed (including adjusting bedclothes, sheets and blankets)?: Total Difficulty moving from lying on back to sitting on the side of the bed? : Total Difficulty sitting down on and standing up from a chair with arms (e.g., wheelchair, bedside commode, etc,.)?: Total Help needed moving to and from a bed to chair (including a wheelchair)?: Total Help needed walking in hospital  room?: Total Help needed climbing 3-5 steps with a railing? : Total 6 Click Score: 6    End of Session Equipment Utilized During Treatment: Gait belt Activity Tolerance: Patient tolerated treatment well Patient left: in bed;with call bell/phone within reach;with bed alarm set;with nursing/sitter in room         Time: 5520-8022 PT Time Calculation (min) (ACUTE ONLY): 17 min  Charges:  $Therapeutic Activity: 8-22 mins                    G Codes:       Chesley Noon, PTA 12/27/16, 10:15 AM

## 2016-12-27 NOTE — Progress Notes (Signed)
[facility] [place] NAME: Jennifer Murray    MR#:  [id number]  DATE OF BIRTH:  1927/09/23  SUBJECTIVE:  CHIEF COMPLAINT:   Chief Complaint  Patient presents with  . Weakness     Came with weakness, could not get up from the potty, found to have UTI. Also have rhabdomyolysis. Looks Better and more energetic today.  REVIEW OF SYSTEMS:  CONSTITUTIONAL: No fever,positive for fatigue or weakness.  EYES: No blurred or double vision.  EARS, NOSE, AND THROAT: No tinnitus or ear pain.  RESPIRATORY: No cough, shortness of breath, wheezing or hemoptysis.  CARDIOVASCULAR: No chest pain, orthopnea, edema.  GASTROINTESTINAL: No nausea, vomiting, diarrhea or abdominal pain.  GENITOURINARY: No dysuria, hematuria.  ENDOCRINE: No polyuria, nocturia,  HEMATOLOGY: No anemia, easy bruising or bleeding SKIN: No rash or lesion. MUSCULOSKELETAL: No joint pain or arthritis.   NEUROLOGIC: No tingling, numbness, weakness.  PSYCHIATRY: No anxiety or depression.   ROS  DRUG ALLERGIES:   Allergies  Allergen Reactions  . Ciprocin-Fluocin-Procin [Fluocinolone Acetonide] Other (See Comments)    dizziness  . Ciprofloxacin Other (See Comments)    Dizziness.    VITALS:  Blood pressure 111/80, pulse 98, temperature 98.5 F (36.9 C), temperature source Oral, resp. rate 20, height 5\' 6"  (1.676 m), weight 79.8 kg (176 lb), SpO2 96 %.  PHYSICAL EXAMINATION:  GENERAL:  83 y.o.-year-old patient lying in the bed with no acute distress.  EYES: Pupils equal, round, reactive to light and accommodation. No scleral icterus. Extraocular muscles intact.  HEENT: Head atraumatic, normocephalic. Oropharynx and nasopharynx clear.  NECK:  Supple, no jugular venous distention. No thyroid enlargement, no tenderness.  LUNGS: Normal breath sounds bilaterally, no wheezing, no crepitation. No use of accessory muscles of respiration.  CARDIOVASCULAR: S1, S2 normal. No murmurs, rubs, or  gallops.  ABDOMEN: Soft, nontender, nondistended. Bowel sounds present. No organomegaly or mass.  EXTREMITIES: No pedal edema, cyanosis, or clubbing. On the right shoulder there is some bruise present. NEUROLOGIC: Cranial nerves II through XII are intact. Muscle strength 2-3/5 in all extremities. Sensation intact. Gait not checked.  PSYCHIATRIC: The patient is alert and oriented x 3.  SKIN: No obvious rash, lesion, or ulcer.   Physical Exam LABORATORY PANEL:   CBC  Recent Labs Lab 12/26/16 0449  WBC 5.4  HGB 12.0  HCT 33.8*  PLT 120*   ------------------------------------------------------------------------------------------------------------------  Chemistries   Recent Labs Lab 12/24/16 2038  12/26/16 0449 12/27/16 0533  NA 136  < > 137 140  K 3.5  < > 3.2* 3.4*  CL 106  < > 110 109  CO2 23  < > 24 27  GLUCOSE 153*  < > 116* 117*  BUN 19  < > 8 6  CREATININE 0.68  < > 0.54 0.44  CALCIUM 9.3  < > 8.6* 8.7*  MG  --   --  1.7  --   AST 159*  --   --   --   ALT 49  --   --   --   ALKPHOS 80  --   --   --   BILITOT 1.4*  --   --   --   < > = values in this interval not displayed. ------------------------------------------------------------------------------------------------------------------  Cardiac Enzymes  Recent Labs Lab 12/24/16 2038  TROPONINI 0.03*   ------------------------------------------------------------------------------------------------------------------  RADIOLOGY:  Dg Foot 2 Views Left  Result Date: 12/26/2016 CLINICAL DATA:  Foot pain.  Fall from toilet EXAM: LEFT  FOOT - 2 VIEW COMPARISON:  None. FINDINGS: No fracture the hindfoot or midfoot. Potential remote fracture of the metatarsal head of the first ray. There is also Hallux valgus deformity. The phalanges of the second ray are malaligned which is favored chronic. IMPRESSION: No acute findings of the foot. Hallux valgus deformity of the first ray. Deformity of the phalanges of the second  digit is favor chronic. Electronically Signed   By: Suzy Bouchard M.D.   On: 12/26/2016 14:39   Dg Foot 2 Views Right  Result Date: 12/26/2016 CLINICAL DATA:  83 year old female with bilateral foot pain since falling off the toilet 3 days previously EXAM: RIGHT FOOT - 2 VIEW COMPARISON:  None. FINDINGS: The bones are diffusely osteopenic. Hallux valgus angulation of the great toe with bony bunion formation. Degenerative osteoarthritis at the TMT joints. No focal acute fracture or malalignment. IMPRESSION: 1. No acute fracture or malalignment. 2. Diffuse osteopenia. 3. Hallux valgus angulation of the great toe with bony bunion formation. 4. Mild midfoot degenerative osteoarthritis. Electronically Signed   By: Jacqulynn Cadet M.D.   On: 12/26/2016 14:37    ASSESSMENT AND PLAN:   Active Problems:   UTI (urinary tract infection)   Cystitis   Goals of care, counseling/discussion   Palliative care encounter   Fall   * UTI - Based on UA - Related sensitivity result on urine culture- Klebsiella - On IV Rocephin- as per cx- sensitive to that.  * Bacteremia    Blood cx have klebsiella    Awaited final result on that, but in urine - sensitive to rocephin.  * Acute rhabdomyolysis - Due to fall and dehydration - CK is 3580> 745> 300. - Aggressive IV fluid hydration  * Hypotension -  Her initial blood pressure in the ED was in 70s, received couple liters of fluid and her pressure is, and 120s.  Now - Continue aggressive hydration   BP is stable now.  * Right shoulder and buttock pain - Status post fall - Likely muscular strain, Xray are negative. - Consult physical and occupational therapy, need rehab placement - ice pack on shoulder and hip for pain.  * hypokalemia   Replace oral.   All the records are reviewed and case discussed with Care Management/Social Workerr. Management plans discussed with the patient, family and they are in agreement.  CODE STATUS: Full.  TOTAL  TIME TAKING CARE OF THIS PATIENT: 35 minutes.   Discussed with her daughter on phone.  POSSIBLE D/C IN 2-3 DAYS, DEPENDING ON CLINICAL CONDITION.   Jennifer Murray M.D on 12/27/2016   Between 7am to 6pm - Pager - [phone]  After 6pm go to [email or web address] - password [email or web address] [facility]  Office  [phone]  CC: Primary care physician; Guadalupe Maple, MD  Note: This dictation was prepared with Dragon dictation along with smaller phrase technology. Any transcriptional errors that result from this process are unintentional.

## 2016-12-27 NOTE — NC FL2 (Signed)
[place] LEVEL OF CARE SCREENING TOOL     IDENTIFICATION  Patient Name: Jennifer Murray Birthdate: [birth year] Sex: female Admission Date (Current Location): 12/24/2016  [place] and [place] Number:  Engineering geologist and Address:  [facility], [address], [place], [place] [address]      Provider Number: [id number]  Attending Physician Name and Address:  Vaughan Basta, *  Relative Name and Phone Number:       Current Level of Care: Hospital Recommended Level of Care: [place] Prior Approval Number:    Date Approved/Denied:   PASRR Number: [id number]  Discharge Plan: SNF    Current Diagnoses: Patient Active Problem List   Diagnosis Date Noted  . Fall   . UTI (urinary tract infection) 12/25/2016  . Cystitis   . Goals of care, counseling/discussion   . Palliative care encounter   . Senile purpura ([place]) 11/04/2016  . Acute cerebrovascular insufficiency transient focal neurologic deficit   . Arthritis   . Cancer ([place])   . Diverticulosis   . Obesity   . Osteoporosis   . Acute cholecystitis 09/19/2016  . Candidal intertrigo 04/02/2016  . Advanced directives, counseling/discussion 04/02/2016  . Primary osteoarthritis of both shoulders 04/04/2015  . Essential hypertension 04/02/2015  . Lumbar spinal stenosis 04/02/2015  . Leg edema 04/02/2015  . LVH (left ventricular hypertrophy) due to hypertensive disease, without heart failure 01/24/2015  . Venous insufficiency of both lower extremities 01/10/2015  . Shortness of breath 12/25/2014  . Atherosclerosis of both carotid arteries 12/25/2014    Orientation RESPIRATION BLADDER Height & Weight     Self, Time, Situation, Place  Normal Continent Weight: 176 lb (79.8 kg) Height:  5\' 6"  (167.6 cm)  BEHAVIORAL SYMPTOMS/MOOD NEUROLOGICAL BOWEL NUTRITION STATUS      Continent    AMBULATORY STATUS COMMUNICATION OF NEEDS Skin   Extensive Assist Verbally  Normal                       Personal Care Assistance Level of Assistance  Bathing, Feeding, Dressing Bathing Assistance: Limited assistance Feeding assistance: Independent Dressing Assistance: Limited assistance     Functional Limitations Info             SPECIAL CARE FACTORS FREQUENCY  PT (By licensed PT)     PT Frequency: Upto 5X per day, 5 days per week.              Contractures Contractures Info: Present    Additional Factors Info  Code Status, Allergies Code Status Info: DNR Allergies Info: Ciprocin-fluocin-procin Fluocinolone Acetonide, Ciprofloxacin           Current Medications (12/27/2016):  This is the current hospital active medication list Current Facility-Administered Medications  Medication Dose Route Frequency Provider Last Rate Last Dose  . 0.9 %  sodium chloride infusion   Intravenous Continuous Vaughan Basta, MD 50 mL/hr at 12/27/16 0600    . acetaminophen (TYLENOL) tablet 650 mg  650 mg Oral Q6H PRN Max Sane, MD   650 mg at 12/26/16 1115   Or  . acetaminophen (TYLENOL) suppository 650 mg  650 mg Rectal Q6H PRN Max Sane, MD      . bisacodyl (DULCOLAX) EC tablet 5 mg  5 mg Oral Daily PRN Max Sane, MD   5 mg at 12/27/16 1011  . cefTRIAXone (ROCEPHIN) 2 g in dextrose 5 % 50 mL IVPB  2 g Intravenous Q24H Vaughan Basta, MD  Stopped at 12/27/16 1041  . docusate sodium (COLACE) capsule 100 mg  100 mg Oral BID Max Sane, MD   100 mg at 12/27/16 1011  . heparin injection 5,000 Units  5,000 Units Subcutaneous Q8H Max Sane, MD   5,000 Units at 12/27/16 1442  . HYDROcodone-acetaminophen (NORCO/VICODIN) 5-325 MG per tablet 1-2 tablet  1-2 tablet Oral Q4H PRN Max Sane, MD      . ondansetron (ZOFRAN) tablet 4 mg  4 mg Oral Q6H PRN Max Sane, MD       Or  . ondansetron (ZOFRAN) injection 4 mg  4 mg Intravenous Q6H PRN Manuella Ghazi, Vipul, MD      . potassium chloride SA (K-DUR,KLOR-CON) CR tablet 20 mEq  20 mEq Oral BID  Vaughan Basta, MD   20 mEq at 12/27/16 1011  . traZODone (DESYREL) tablet 25 mg  25 mg Oral QHS PRN Max Sane, MD         Discharge Medications: Please see discharge summary for a list of discharge medications.  Relevant Imaging Results:  Relevant Lab Results:   Additional Information SS# [id number]  Zettie Pho, LCSW

## 2016-12-27 NOTE — Clinical Social Work Note (Signed)
Clinical Social Work Assessment  Patient Details  Name: Jennifer Murray MRN: [id number] Date of Birth: [birth year]  Date of referral:  12/27/16               Reason for consult:  Facility Placement                Permission sought to share information with:  Facility Art therapist granted to share information::  Yes, Verbal Permission Granted  Name::        Agency::     Relationship::     Contact Information:     Housing/Transportation Living arrangements for the past 2 months:  Single Family Home Source of Information:  Patient, Adult Children, Medical Team Patient Interpreter Needed:  None Criminal Activity/Legal Involvement Pertinent to Current Situation/Hospitalization:  No - Comment as needed Significant Relationships:  Adult Children, [place], [employer] Lives with:  Self Do you feel safe going back to the place where you live?  Yes Need for family participation in patient care:  No (Coment)  Care giving concerns:  PT recommendation for STR   Social Worker assessment / plan:  CSW visited the patient, her son, 2 of 3 daughters, and her daughter in law at bedside to discuss discharge planning. The patient gave verbal permission to conduct a STR referral, and they each asked appropriate questions about what to expect at SNF. The CSW also explained how Medicare pays for STR, and the CSW also gave information about the ratings of each SNF. The family seems most interested in [facility] or [facility]   The patient will most likely discharge on Monday. Her qualifying stay will be as of 12/28/16.    Employment status:  Retired Forensic scientist:  [employer] PT Recommendations:  [place] / Referral to community resources:  [place]  Patient/Family's Response to care:  The family thanked the CSW for information and attention.  Patient/Family's Understanding of and Emotional Response to Diagnosis, Current  Treatment, and Prognosis:  The patient's family is appropriately attentive to the patient's needs, and they are realistic about what to expect at SNF. The patient reported feeling pleased that the CSW gave understandable information about the process.  Emotional Assessment Appearance:  Appears stated age Attitude/Demeanor/Rapport:   (Pleasant and alert) Affect (typically observed):  Pleasant, Appropriate Orientation:  Oriented to Self, Oriented to Place, Oriented to  Time, Oriented to Situation Alcohol / Substance use:  Never Used Psych involvement (Current and /or in the community):  No (Comment)  Discharge Needs  Concerns to be addressed:  Care Coordination, Discharge Planning Concerns Readmission within the last 30 days:  No Current discharge risk:  None Barriers to Discharge:  Continued Medical Work up   [employer], LCSW 12/27/2016, 4:21 PM

## 2016-12-27 NOTE — Progress Notes (Signed)
No issues or concerns overnight. Pnt resting no c/o pain or discomfort. Bed low and locked, call bell in reach. Will continue to monitor and assess.

## 2016-12-28 LAB — BASIC METABOLIC PANEL
ANION GAP: 5 (ref 5–15)
BUN: 6 mg/dL (ref 6–20)
CHLORIDE: 109 mmol/L (ref 101–111)
CO2: 25 mmol/L (ref 22–32)
Calcium: 9.4 mg/dL (ref 8.9–10.3)
Creatinine, Ser: 0.48 mg/dL (ref 0.44–1.00)
GFR calc Af Amer: >60 mL/min (ref >60)
Glucose, Bld: 107 mg/dL — ABNORMAL HIGH (ref 65–99)
POTASSIUM: 3.8 mmol/L (ref 3.5–5.1)
SODIUM: 139 mmol/L (ref 135–145)

## 2016-12-28 LAB — GLUCOSE, CAPILLARY: Glucose-Capillary: 127 mg/dL — ABNORMAL HIGH (ref 65–99)

## 2016-12-28 LAB — CULTURE, BLOOD (ROUTINE X 2)

## 2016-12-28 LAB — CK: Total CK: 114 U/L (ref 38–234)

## 2016-12-28 MED ORDER — MAGNESIUM SULFATE 2 GM/50ML IV SOLN
2.0000 g | Freq: Once | INTRAVENOUS | Status: AC
  Administered 2016-12-28: 2 g via INTRAVENOUS
  Filled 2016-12-28: qty 50

## 2016-12-28 MED ORDER — CEFAZOLIN SODIUM-DEXTROSE 1-4 GM/50ML-% IV SOLN
1.0000 g | Freq: Two times a day (BID) | INTRAVENOUS | Status: DC
  Filled 2016-12-28 (×2): qty 50

## 2016-12-28 NOTE — Progress Notes (Signed)
[facility] [place] NAME: Jennifer Murray    MR#:  [id number]  DATE OF BIRTH:  [birth year]  SUBJECTIVE:  CHIEF COMPLAINT:   Chief Complaint  Patient presents with  . Weakness     Came with weakness, could not get up from the potty, found to have UTI. Also have rhabdomyolysis. Looks Better and more energetic today.  REVIEW OF SYSTEMS:  CONSTITUTIONAL: No fever,positive for fatigue or weakness.  EYES: No blurred or double vision.  EARS, NOSE, AND THROAT: No tinnitus or ear pain.  RESPIRATORY: No cough, shortness of breath, wheezing or hemoptysis.  CARDIOVASCULAR: No chest pain, orthopnea, edema.  GASTROINTESTINAL: No nausea, vomiting, diarrhea or abdominal pain.  GENITOURINARY: No dysuria, hematuria.  ENDOCRINE: No polyuria, nocturia,  HEMATOLOGY: No anemia, easy bruising or bleeding SKIN: No rash or lesion. MUSCULOSKELETAL: No joint pain or arthritis.   NEUROLOGIC: No tingling, numbness, weakness.  PSYCHIATRY: No anxiety or depression.   ROS  DRUG ALLERGIES:   Allergies  Allergen Reactions  . Ciprocin-Fluocin-Procin [Fluocinolone Acetonide] Other (See Comments)    dizziness  . Ciprofloxacin Other (See Comments)    Dizziness.    VITALS:  Blood pressure (!) 131/57, pulse 63, temperature (!) 97.3 F (36.3 C), temperature source Oral, resp. rate 20, height 5\' 6"  (1.676 m), weight 76.7 kg (169 lb 1.6 oz), SpO2 96 %.  PHYSICAL EXAMINATION:  GENERAL:  83 y.o.-year-old patient lying in the bed with no acute distress.  EYES: Pupils equal, round, reactive to light and accommodation. No scleral icterus. Extraocular muscles intact.  HEENT: Head atraumatic, normocephalic. Oropharynx and nasopharynx clear.  NECK:  Supple, no jugular venous distention. No thyroid enlargement, no tenderness.  LUNGS: Normal breath sounds bilaterally, no wheezing, no crepitation. No use of accessory muscles of respiration.  CARDIOVASCULAR: S1, S2 normal. No  murmurs, rubs, or gallops.  ABDOMEN: Soft, nontender, nondistended. Bowel sounds present. No organomegaly or mass.  EXTREMITIES: No pedal edema, cyanosis, or clubbing. On the right shoulder there is some bruise present. NEUROLOGIC: Cranial nerves II through XII are intact. Muscle strength 2-3/5 in all extremities. Sensation intact. Gait not checked.  PSYCHIATRIC: The patient is alert and oriented x 3.  SKIN: No obvious rash, lesion, or ulcer.   Physical Exam LABORATORY PANEL:   CBC  Recent Labs Lab 12/26/16 0449  WBC 5.4  HGB 12.0  HCT 33.8*  PLT 120*   ------------------------------------------------------------------------------------------------------------------  Chemistries   Recent Labs Lab 12/24/16 2038  12/26/16 0449  12/28/16 0531  NA 136  < > 137  < > 139  K 3.5  < > 3.2*  < > 3.8  CL 106  < > 110  < > 109  CO2 23  < > 24  < > 25  GLUCOSE 153*  < > 116*  < > 107*  BUN 19  < > 8  < > 6  CREATININE 0.68  < > 0.54  < > 0.48  CALCIUM 9.3  < > 8.6*  < > 9.4  MG  --   --  1.7  --   --   AST 159*  --   --   --   --   ALT 49  --   --   --   --   ALKPHOS 80  --   --   --   --   BILITOT 1.4*  --   --   --   --   < > = values  in this interval not displayed. ------------------------------------------------------------------------------------------------------------------  Cardiac Enzymes  Recent Labs Lab 12/24/16 2038  TROPONINI 0.03*   ------------------------------------------------------------------------------------------------------------------  RADIOLOGY:  Dg Foot 2 Views Left  Result Date: 12/26/2016 CLINICAL DATA:  Foot pain.  Fall from toilet EXAM: LEFT FOOT - 2 VIEW COMPARISON:  None. FINDINGS: No fracture the hindfoot or midfoot. Potential remote fracture of the metatarsal head of the first ray. There is also Hallux valgus deformity. The phalanges of the second ray are malaligned which is favored chronic. IMPRESSION: No acute findings of the foot.  Hallux valgus deformity of the first ray. Deformity of the phalanges of the second digit is favor chronic. Electronically Signed   By: Suzy Bouchard M.D.   On: 12/26/2016 14:39   Dg Foot 2 Views Right  Result Date: 12/26/2016 CLINICAL DATA:  83 year old female with bilateral foot pain since falling off the toilet 3 days previously EXAM: RIGHT FOOT - 2 VIEW COMPARISON:  None. FINDINGS: The bones are diffusely osteopenic. Hallux valgus angulation of the great toe with bony bunion formation. Degenerative osteoarthritis at the TMT joints. No focal acute fracture or malalignment. IMPRESSION: 1. No acute fracture or malalignment. 2. Diffuse osteopenia. 3. Hallux valgus angulation of the great toe with bony bunion formation. 4. Mild midfoot degenerative osteoarthritis. Electronically Signed   By: Jacqulynn Cadet M.D.   On: 12/26/2016 14:37    ASSESSMENT AND PLAN:   Active Problems:   UTI (urinary tract infection)   Cystitis   Goals of care, counseling/discussion   Palliative care encounter   Fall   * UTI with sepsis    Sepsis was present on admission ( hypotension, fever, Tachycardia and confusion) -  sensitivity result on urine culture- Klebsiella. - On IV Rocephin- as per cx- sensitive to that.  * Bacteremia    Blood cx have klebsiella- same as urine.    May switch to keflex or ceftin on d/c, may need total 10-14 days due to bacteremia.  * Acute rhabdomyolysis - Due to fall and dehydration - CK is 3580> 745> 300> 100 - Aggressive IV fluid hydration - stop IV fluids now.  * Hypotension -  Her initial blood pressure in the ED was in 70s, received couple liters of fluid and her pressure is, and 120s.  Now - Continue aggressive hydration   BP is stable now.  * Right shoulder and buttock pain - Status post fall - Likely muscular strain, Xray are negative. - Consult physical and occupational therapy, need rehab placement - ice pack on shoulder and hip for pain.  *  hypokalemia   Replace oral.   All the records are reviewed and case discussed with Care Management/Social Workerr. Management plans discussed with the patient, family and they are in agreement.  CODE STATUS: Full.  TOTAL TIME TAKING CARE OF THIS PATIENT: 35 minutes.   Discussed with her daughter in room. Awaited approval from insurance.  POSSIBLE D/C IN 1-2 DAYS, DEPENDING ON CLINICAL CONDITION.   Vaughan Basta M.D on 12/28/2016   Between 7am to 6pm - Pager - [phone]  After 6pm go to [email or web address] - password [email or web address] [facility]  Office  [phone]  CC: Primary care physician; Guadalupe Maple, MD  Note: This dictation was prepared with Dragon dictation along with smaller phrase technology. Any transcriptional errors that result from this process are unintentional.

## 2016-12-29 DIAGNOSIS — K5900 Constipation, unspecified: Secondary | ICD-10-CM | POA: Diagnosis not present

## 2016-12-29 DIAGNOSIS — M19011 Primary osteoarthritis, right shoulder: Secondary | ICD-10-CM | POA: Diagnosis not present

## 2016-12-29 DIAGNOSIS — M25511 Pain in right shoulder: Secondary | ICD-10-CM | POA: Diagnosis not present

## 2016-12-29 DIAGNOSIS — R531 Weakness: Secondary | ICD-10-CM | POA: Diagnosis not present

## 2016-12-29 DIAGNOSIS — R1312 Dysphagia, oropharyngeal phase: Secondary | ICD-10-CM | POA: Diagnosis not present

## 2016-12-29 DIAGNOSIS — R498 Other voice and resonance disorders: Secondary | ICD-10-CM | POA: Diagnosis not present

## 2016-12-29 DIAGNOSIS — Z5189 Encounter for other specified aftercare: Secondary | ICD-10-CM | POA: Diagnosis not present

## 2016-12-29 DIAGNOSIS — A419 Sepsis, unspecified organism: Secondary | ICD-10-CM | POA: Diagnosis not present

## 2016-12-29 DIAGNOSIS — R2681 Unsteadiness on feet: Secondary | ICD-10-CM | POA: Diagnosis not present

## 2016-12-29 DIAGNOSIS — M6281 Muscle weakness (generalized): Secondary | ICD-10-CM | POA: Diagnosis not present

## 2016-12-29 DIAGNOSIS — I890 Lymphedema, not elsewhere classified: Secondary | ICD-10-CM | POA: Diagnosis not present

## 2016-12-29 DIAGNOSIS — N390 Urinary tract infection, site not specified: Secondary | ICD-10-CM | POA: Diagnosis not present

## 2016-12-29 DIAGNOSIS — K5909 Other constipation: Secondary | ICD-10-CM | POA: Diagnosis not present

## 2016-12-29 DIAGNOSIS — E669 Obesity, unspecified: Secondary | ICD-10-CM | POA: Diagnosis not present

## 2016-12-29 DIAGNOSIS — I6789 Other cerebrovascular disease: Secondary | ICD-10-CM | POA: Diagnosis not present

## 2016-12-29 DIAGNOSIS — Z8673 Personal history of transient ischemic attack (TIA), and cerebral infarction without residual deficits: Secondary | ICD-10-CM | POA: Diagnosis not present

## 2016-12-29 DIAGNOSIS — R7881 Bacteremia: Secondary | ICD-10-CM | POA: Diagnosis not present

## 2016-12-29 DIAGNOSIS — R1310 Dysphagia, unspecified: Secondary | ICD-10-CM | POA: Diagnosis not present

## 2016-12-29 DIAGNOSIS — M6282 Rhabdomyolysis: Secondary | ICD-10-CM | POA: Diagnosis not present

## 2016-12-29 DIAGNOSIS — Z7901 Long term (current) use of anticoagulants: Secondary | ICD-10-CM | POA: Diagnosis not present

## 2016-12-29 DIAGNOSIS — Z9181 History of falling: Secondary | ICD-10-CM | POA: Diagnosis not present

## 2016-12-29 DIAGNOSIS — Z7401 Bed confinement status: Secondary | ICD-10-CM | POA: Diagnosis not present

## 2016-12-29 DIAGNOSIS — M19012 Primary osteoarthritis, left shoulder: Secondary | ICD-10-CM | POA: Diagnosis not present

## 2016-12-29 DIAGNOSIS — E568 Deficiency of other vitamins: Secondary | ICD-10-CM | POA: Diagnosis not present

## 2016-12-29 LAB — CBC
HCT: 38.6 % (ref 35.0–47.0)
HEMOGLOBIN: 13.3 g/dL (ref 12.0–16.0)
MCH: 31.4 pg (ref 26.0–34.0)
MCHC: 34.4 g/dL (ref 32.0–36.0)
MCV: 91.1 fL (ref 80.0–100.0)
Platelets: 147 10*3/uL — ABNORMAL LOW (ref 150–440)
RBC: 4.23 MIL/uL (ref 3.80–5.20)
RDW: 13.2 % (ref 11.5–14.5)
WBC: 5.3 10*3/uL (ref 3.6–11.0)

## 2016-12-29 LAB — CULTURE, BLOOD (ROUTINE X 2)
CULTURE: NO GROWTH
SPECIAL REQUESTS: ADEQUATE

## 2016-12-29 LAB — GLUCOSE, CAPILLARY: Glucose-Capillary: 118 mg/dL — ABNORMAL HIGH (ref 65–99)

## 2016-12-29 MED ORDER — CEPHALEXIN 250 MG PO CAPS: 250.0000 mg | ORAL_CAPSULE | Freq: Three times a day (TID) | ORAL | 0 refills | Status: AC

## 2016-12-29 MED ORDER — DOCUSATE SODIUM 100 MG PO CAPS: 100.0000 mg | ORAL_CAPSULE | Freq: Two times a day (BID) | ORAL | 0 refills | Status: AC

## 2016-12-29 MED ORDER — CEFAZOLIN SODIUM-DEXTROSE 2-4 GM/100ML-% IV SOLN
2.0000 g | Freq: Three times a day (TID) | INTRAVENOUS | Status: DC
  Administered 2016-12-29: 2 g via INTRAVENOUS
  Filled 2016-12-29 (×4): qty 100

## 2016-12-29 NOTE — Clinical Social Work Note (Signed)
Pt is ready for discharge today and will go to [facility]. Facility is ready to admit pt as they have received discharge information. Pt and family are aware and agreeable to discharge plan. RN will call report. [facility] will provide transportation. CSW is signing off as no further needs identified.   Darden Dates, MSW, LCSW  Clinical Social Worker  [phone]

## 2016-12-29 NOTE — Progress Notes (Signed)
Chaplain was making rounds and visited with pt in room 116. Chaplain provided [employer] of prayer and a pastoral presence.    12/29/16 1110  Clinical Encounter Type  Visited With Patient  Visit Type Initial;Spiritual support  Referral From Nurse  Consult/Referral To Chaplain  Spiritual Encounters  Spiritual Needs Prayer

## 2016-12-29 NOTE — Progress Notes (Signed)
Pt is being discharged to [facility]. Report given to [place], [place]. AVS given and explained to pt and daughter, both verbalized understanding. Meds reviewed. Additional copy of AVS placed in discharge envelope. Awaiting EMS.

## 2016-12-29 NOTE — Evaluation (Signed)
Clinical/Bedside Swallow Evaluation Patient Details  Name: Jennifer Murray MRN: [id number] Date of Birth: 1927/11/22  Today's Date: 12/29/2016 Time: SLP Start Time (ACUTE ONLY): 1200 SLP Stop Time (ACUTE ONLY): 1300 SLP Time Calculation (min) (ACUTE ONLY): 60 min  Past Medical History:  Past Medical History:  Diagnosis Date  . Acute cerebrovascular insufficiency transient focal neurologic deficit 2003  . Arthritis   . Cancer (HCC)    Basal Cell  . Diverticulosis   . Leg edema   . Lumbar spinal stenosis   . Obesity   . Osteoporosis   . Stroke [facility])    Past Surgical History:  Past Surgical History:  Procedure Laterality Date  . BREAST BIOPSY Left 1975   Benign per patient  . COLONOSCOPY W/ POLYPECTOMY    . EYE SURGERY Bilateral 2016   cataract  . JOINT REPLACEMENT Left 2011   knee   HPI:  Pt is a 83 y.o. female with a known history of Chronic lymphedema, lumbar spinal stenosis, h/o Stroke, diverticulosis, Obesity, Cancer, leg edema is being admitted status post fall. Patient was found on the ground by her family. She did not use her life alert when she fell due to weakness. EMS arrived and found her febrile and complaining of right hip and right shoulder pain. She was on the ground for most of the night.  Patient at baseline uses a frontwheel walker and when she goes long distances.  She will use wheelchair.  She fell into the commode and pulled the string after a while as she couldn't get up.  EMS was called, who helped her get in the bed.  She was hurting in her right shoulder and buttock in those areas were x-rayed and has no acute fracture.  She was noted to have UTI and is being admitted for further evaluation and management.    Assessment / Plan / Recommendation Clinical Impression  Pt appears to present w/ slight-min Oral phase dysphagia; NO pharyngeal phase deficits appreciated during po intake. Pt appears at reduced risk for aspiration when following general aspiration  precautions and following a min modified diet and reducing environmental distractions during meals. Pt consumed po trials of thin liquids via cup/straw, purees, and mech soft foods(cut foods) w/ no overt s/s of aspiration noted but slight-min Oral phase dysphagia noted c/b increased mastication and oral phase time w/ increased textures/foods; more timely swallowing noted w/ purees and thin liquids via cup/straw though. Pt exhibited min slower motor speech movements in coordination tasks which may be related to the min slower oral phase management. Pt also benefited from reducing distractions from environmental stimuli (TV, talking w/ others) during eating/drinking. Pt was also educated on the need to follow general aspiration precautions including small, single bites/sips and clear mouth fully b/t bites/sips. Pt verbally agreed and followed through w/ instructions w/ gentle cues but Daughter felt pt might not remember everything "later". Recommend a mech soft diet w/ thin liquids; general aspiration precautions; Pills given in a Puree for easier swallowing if pt is having difficulty coordinating the oral phase of swallowing Pills. NSG and MD updated.  SLP Visit Diagnosis: Dysphagia, oral phase (R13.11)    Aspiration Risk   (reduced)    Diet Recommendation  Mech Soft (Dysphagia level 3) w/ Thin liquids; aspiration precautions; support/monitoring at meals as needed for follow through w/ precautions  Medication Administration: Whole meds with puree (as needed for easier swallowing)    Other  Recommendations Recommended Consults:  (n/a at this time) Oral  Care Recommendations: Oral care BID;Patient independent with oral care;Staff/trained caregiver to provide oral care   Follow up Recommendations None      Frequency and Duration  n/a         Prognosis Prognosis for Safe Diet Advancement: Good      Swallow Study   General Date of Onset: 12/24/16 HPI: Pt is a 83 y.o. female with a known history  of Chronic lymphedema, lumbar spinal stenosis, h/o Stroke, diverticulosis, Obesity, Cancer, leg edema is being admitted status post fall. Patient was found on the ground by her family. She did not use her life alert when she fell due to weakness. EMS arrived and found her febrile and complaining of right hip and right shoulder pain. She was on the ground for most of the night.  Patient at baseline uses a frontwheel walker and when she goes long distances.  She will use wheelchair.  She fell into the commode and pulled the string after a while as she couldn't get up.  EMS was called, who helped her get in the bed.  She was hurting in her right shoulder and buttock in those areas were x-rayed and has no acute fracture.  She was noted to have UTI and is being admitted for further evaluation and management.  Type of Study: Bedside Swallow Evaluation Previous Swallow Assessment: none Diet Prior to this Study: Regular;Nectar-thick liquids Temperature Spikes Noted: No (wbc 5.3) Respiratory Status: Room air History of Recent Intubation: No Behavior/Cognition: Alert;Cooperative;Pleasant mood;Distractible (min; slight slowness in motor speech responses) Oral Cavity Assessment: Within Functional Limits Oral Care Completed by SLP: Recent completion by staff Oral Cavity - Dentition: Adequate natural dentition Vision: Functional for self-feeding Self-Feeding Abilities: Able to feed self;Needs set up Patient Positioning: Upright in bed (support needed) Baseline Vocal Quality: Normal Volitional Cough: Strong Volitional Swallow: Able to elicit    Oral/Motor/Sensory Function Overall Oral Motor/Sensory Function: Within functional limits (min slower coordinated, rapid movements)   Ice Chips Ice chips: Not tested   Thin Liquid Thin Liquid: Within functional limits Presentation: Cup;Self Fed;Straw (~8-10 ozs total)    Nectar Thick Nectar Thick Liquid: Not tested   Honey Thick Honey Thick Liquid: Not tested    Puree Puree: Within functional limits Presentation: Self Fed;Spoon (~4 ozs )   Solid   GO   Solid: Impaired Presentation: Self Fed;Spoon (8 trials) Oral Phase Impairments: Impaired mastication (increased oral phase time; munching) Oral Phase Functional Implications: Prolonged oral transit;Impaired mastication (min) Pharyngeal Phase Impairments:  (none)         Orinda Kenner, MS, CCC-SLP Watson,Katherine 12/29/2016,3:38 PM

## 2016-12-29 NOTE — Discharge Summary (Signed)
[place] at [place] NAME: Jennifer Murray    MR#:  [id number]  DATE OF BIRTH:  1928-03-18  DATE OF ADMISSION:  12/24/2016   ADMITTING PHYSICIAN: Max Sane, MD  DATE OF DISCHARGE: 12/29/16  PRIMARY CARE PHYSICIAN: Guadalupe Maple, MD   ADMISSION DIAGNOSIS:   Generalized weakness [R53.1] Cystitis [N30.90] Traumatic rhabdomyolysis, initial encounter ([place]) [T79.6XXA]  DISCHARGE DIAGNOSIS:   Active Problems:   UTI (urinary tract infection)   Cystitis   Goals of care, counseling/discussion   Palliative care encounter   Fall   SECONDARY DIAGNOSIS:   Past Medical History:  Diagnosis Date  . Acute cerebrovascular insufficiency transient focal neurologic deficit 2003  . Arthritis   . Cancer (HCC)    Basal Cell  . Diverticulosis   . Leg edema   . Lumbar spinal stenosis   . Obesity   . Osteoporosis   . Stroke [facility])     HOSPITAL COURSE:   83 year old female with past medical history significant for chronic lymphedema, lumbar spinal stenosis, history of CVA, arthritis presents to hospital secondary to a fall.  #1 sepsis-secondary to urinary tract infection. -Blood cultures and urine cultures growing sensitive Klebsiella. -Received IV Ancef in the hospital, being transitioned to Keflex at discharge for 2 weeks. -No further fevers. WBC  Normalized  #2 rhabdomyolysis-secondary to fall on the commode, sleeps CK was significantly elevated on admission -Resolved with fluids  #3 dysphagia-family concerned about dysphagia in the hospital, we'll have speech eval to check with the consistency of foods.  #4 history of CVA with no focal neurological deficits-continue Plavix at this time.  Patient worked well with physical therapy, they have recommended rehabilitation. -Patient will be discharged [facility] today   DISCHARGE CONDITIONS:   Guarded  CONSULTS OBTAINED:   None  DRUG ALLERGIES:   Allergies  Allergen  Reactions  . Ciprocin-Fluocin-Procin [Fluocinolone Acetonide] Other (See Comments)    dizziness  . Ciprofloxacin Other (See Comments)    Dizziness.   DISCHARGE MEDICATIONS:   Allergies as of 12/29/2016      Reactions   Ciprocin-fluocin-procin [fluocinolone Acetonide] Other (See Comments)   dizziness   Ciprofloxacin Other (See Comments)   Dizziness.      Medication List    TAKE these medications   B COMPLEX PO Take 1 tablet by mouth daily.   CENTRUM SILVER ADULT 50+ PO Take 1 tablet by mouth daily.   cephALEXin 250 MG capsule Commonly known as:  KEFLEX Take 1 capsule (250 mg total) by mouth 3 (three) times daily. X 2 weeks   clopidogrel 75 MG tablet Commonly known as:  PLAVIX Take 1 tablet (75 mg total) by mouth daily.   docusate sodium 100 MG capsule Commonly known as:  COLACE Take 1 capsule (100 mg total) by mouth 2 (two) times daily.   FISH OIL BURP-LESS PO Take 1 capsule by mouth daily.        DISCHARGE INSTRUCTIONS:   1. PCP follow-up in 1-2 weeks  DIET:   Cardiac diet  ACTIVITY:   Activity as tolerated  OXYGEN:   Home Oxygen: No.  Oxygen Delivery: room air  DISCHARGE LOCATION:   nursing home   If you experience worsening of your admission symptoms, develop shortness of breath, life threatening emergency, suicidal or homicidal thoughts you must seek medical attention immediately by calling 911 or calling your MD immediately  if symptoms less severe.  You Must read complete instructions/literature along with all the  possible adverse reactions/side effects for all the Medicines you take and that have been prescribed to you. Take any new Medicines after you have completely understood and accpet all the possible adverse reactions/side effects.   Please note  You were cared for by a hospitalist during your hospital stay. If you have any questions about your discharge medications or the care you received while you were in the hospital after you are  discharged, you can call the unit and asked to speak with the hospitalist on call if the hospitalist that took care of you is not available. Once you are discharged, your primary care physician will handle any further medical issues. Please note that NO REFILLS for any discharge medications will be authorized once you are discharged, as it is imperative that you return to your primary care physician (or establish a relationship with a primary care physician if you do not have one) for your aftercare needs so that they can reassess your need for medications and monitor your lab values.    On the day of Discharge:  VITAL SIGNS:   Blood pressure 114/65, pulse 72, temperature 98.2 F (36.8 C), temperature source Oral, resp. rate 20, height 5\' 6"  (1.676 m), weight 77.1 kg (170 lb), SpO2 95 %.  PHYSICAL EXAMINATION:    GENERAL:  83 y.o.-year-old patient lying in the bed with no acute distress.  EYES: Pupils equal, round, reactive to light and accommodation. No scleral icterus. Extraocular muscles intact.  HEENT: Head atraumatic, normocephalic. Oropharynx and nasopharynx clear. Dry mucous membranes NECK:  Supple, no jugular venous distention. No thyroid enlargement, no tenderness.  LUNGS: Normal breath sounds bilaterally, no wheezing, rales,rhonchi or crepitation. No use of accessory muscles of respiration. Decreased bibasilar breath sounds.  CARDIOVASCULAR: S1, S2 normal. No rubs, or gallops. 2/6 systolic murmur is present ABDOMEN: Soft, non-tender, non-distended. Bowel sounds present. No organomegaly or mass.  EXTREMITIES: No pedal edema, cyanosis, or clubbing.  NEUROLOGIC: Cranial nerves II through XII are intact. Muscle strength 5/5 in all extremities. Sensation intact. Gait not checked. Global weakness noted PSYCHIATRIC: The patient is alert and oriented x 3.  SKIN: No obvious rash, lesion, or ulcer.   DATA REVIEW:   CBC  Recent Labs Lab 12/29/16 0436  WBC 5.3  HGB 13.3  HCT 38.6    PLT 147*    Chemistries   Recent Labs Lab 12/24/16 2038  12/26/16 0449  12/28/16 0531  NA 136  < > 137  < > 139  K 3.5  < > 3.2*  < > 3.8  CL 106  < > 110  < > 109  CO2 23  < > 24  < > 25  GLUCOSE 153*  < > 116*  < > 107*  BUN 19  < > 8  < > 6  CREATININE 0.68  < > 0.54  < > 0.48  CALCIUM 9.3  < > 8.6*  < > 9.4  MG  --   --  1.7  --   --   AST 159*  --   --   --   --   ALT 49  --   --   --   --   ALKPHOS 80  --   --   --   --   BILITOT 1.4*  --   --   --   --   < > = values in this interval not displayed.   Microbiology Results  Results for orders placed or performed during  the hospital encounter of 12/24/16  Blood culture (routine x 2)     Status: Abnormal   Collection Time: 12/24/16  8:39 PM  Result Value Ref Range Status   Specimen Description BLOOD LEFT ANTECUBITAL  Final   Special Requests   Final    BOTTLES DRAWN AEROBIC AND ANAEROBIC Blood Culture results may not be optimal due to an excessive volume of blood received in culture bottles   Culture  Setup Time   Final    GRAM NEGATIVE RODS AEROBIC BOTTLE ONLY CRITICAL RESULT CALLED TO, READ BACK BY AND VERIFIED WITH: NATE COOKSON AT [place] ON 12/26/2016 JJB    Culture KLEBSIELLA PNEUMONIAE (A)  Final   Report Status 12/28/2016 FINAL  Final   Organism ID, Bacteria KLEBSIELLA PNEUMONIAE  Final      Susceptibility   Klebsiella pneumoniae - MIC*    AMPICILLIN >=32 RESISTANT Resistant     CEFAZOLIN <=4 SENSITIVE Sensitive     CEFEPIME <=1 SENSITIVE Sensitive     CEFTAZIDIME <=1 SENSITIVE Sensitive     CEFTRIAXONE <=1 SENSITIVE Sensitive     CIPROFLOXACIN <=0.25 SENSITIVE Sensitive     GENTAMICIN <=1 SENSITIVE Sensitive     IMIPENEM <=0.25 SENSITIVE Sensitive     TRIMETH/SULFA <=20 SENSITIVE Sensitive     AMPICILLIN/SULBACTAM 4 SENSITIVE Sensitive     PIP/TAZO <=4 SENSITIVE Sensitive     Extended ESBL NEGATIVE Sensitive     * KLEBSIELLA PNEUMONIAE  Blood culture (routine x 2)     Status: None   Collection Time:  12/24/16  8:39 PM  Result Value Ref Range Status   Specimen Description BLOOD RIGHT WRIST  Final   Special Requests   Final    BOTTLES DRAWN AEROBIC AND ANAEROBIC Blood Culture adequate volume   Culture NO GROWTH 5 DAYS  Final   Report Status 12/29/2016 FINAL  Final  Blood Culture ID Panel (Reflexed)     Status: Abnormal   Collection Time: 12/24/16  8:39 PM  Result Value Ref Range Status   Enterococcus species NOT DETECTED NOT DETECTED Final   Listeria monocytogenes NOT DETECTED NOT DETECTED Final   Staphylococcus species NOT DETECTED NOT DETECTED Final   Staphylococcus aureus NOT DETECTED NOT DETECTED Final   Streptococcus species NOT DETECTED NOT DETECTED Final   Streptococcus agalactiae NOT DETECTED NOT DETECTED Final   Streptococcus pneumoniae NOT DETECTED NOT DETECTED Final   Streptococcus pyogenes NOT DETECTED NOT DETECTED Final   Acinetobacter baumannii NOT DETECTED NOT DETECTED Final   Enterobacteriaceae species DETECTED (A) NOT DETECTED Final    Comment: Enterobacteriaceae represent a large family of gram-negative bacteria, not a single organism. CRITICAL RESULT CALLED TO, READ BACK BY AND VERIFIED WITH: NATE COOKSON AT 1803 ON 12/26/2016 JJB    Enterobacter cloacae complex NOT DETECTED NOT DETECTED Final   Escherichia coli NOT DETECTED NOT DETECTED Final   Klebsiella oxytoca NOT DETECTED NOT DETECTED Final   Klebsiella pneumoniae DETECTED (A) NOT DETECTED Final    Comment: CRITICAL RESULT CALLED TO, READ BACK BY AND VERIFIED WITH: NATE COOKSON AT 1803 ON 12/26/2016 JJB    Proteus species NOT DETECTED NOT DETECTED Final   Serratia marcescens NOT DETECTED NOT DETECTED Final   Carbapenem resistance NOT DETECTED NOT DETECTED Final   Haemophilus influenzae NOT DETECTED NOT DETECTED Final   Neisseria meningitidis NOT DETECTED NOT DETECTED Final   Pseudomonas aeruginosa NOT DETECTED NOT DETECTED Final   Candida albicans NOT DETECTED NOT DETECTED Final   Candida glabrata NOT  DETECTED NOT  DETECTED Final   Candida krusei NOT DETECTED NOT DETECTED Final   Candida parapsilosis NOT DETECTED NOT DETECTED Final   Candida tropicalis NOT DETECTED NOT DETECTED Final  Urine culture     Status: Abnormal   Collection Time: 12/24/16 11:07 PM  Result Value Ref Range Status   Specimen Description URINE, RANDOM  Final   Special Requests NONE  Final   Culture >=100,000 COLONIES/mL KLEBSIELLA PNEUMONIAE (A)  Final   Report Status 12/27/2016 FINAL  Final   Organism ID, Bacteria KLEBSIELLA PNEUMONIAE (A)  Final      Susceptibility   Klebsiella pneumoniae - MIC*    AMPICILLIN >=32 RESISTANT Resistant     CEFAZOLIN <=4 SENSITIVE Sensitive     CEFTRIAXONE <=1 SENSITIVE Sensitive     CIPROFLOXACIN <=0.25 SENSITIVE Sensitive     GENTAMICIN <=1 SENSITIVE Sensitive     IMIPENEM <=0.25 SENSITIVE Sensitive     NITROFURANTOIN 64 INTERMEDIATE Intermediate     TRIMETH/SULFA <=20 SENSITIVE Sensitive     AMPICILLIN/SULBACTAM 4 SENSITIVE Sensitive     PIP/TAZO <=4 SENSITIVE Sensitive     Extended ESBL NEGATIVE Sensitive     * >=100,000 COLONIES/mL KLEBSIELLA PNEUMONIAE    RADIOLOGY:  No results found.   Management plans discussed with the patient, family and they are in agreement.  CODE STATUS:     Code Status Orders        Start     Ordered   12/25/16 0129  Do not attempt resuscitation (DNR)  Continuous    Question Answer Comment  In the event of cardiac or respiratory ARREST Do not call a "code blue"   In the event of cardiac or respiratory ARREST Do not perform Intubation, CPR, defibrillation or ACLS   In the event of cardiac or respiratory ARREST Use medication by any route, position, wound care, and other measures to relive pain and suffering. May use oxygen, suction and manual treatment of airway obstruction as needed for comfort.      12/25/16 0128    Code Status History    Date Active Date Inactive Code Status Order ID Comments User Context   09/19/2016  7:47 PM  09/22/2016  5:49 PM Full Code [id number]  Olean Ree, MD ED    Advance Directive Documentation     Most Recent Value  Type of Advance Directive  Healthcare Power of [place], Living will  Pre-existing out of facility DNR order (yellow form or pink MOST form)  -  "MOST" Form in Place?  -      TOTAL TIME TAKING CARE OF THIS PATIENT: 37 minutes.    Keon Benscoter M.D on 12/29/2016 at 11:28 AM  Between 7am to 6pm - Pager - [phone]  After 6pm go to [email or web address] - Proofreader  [facility]  Office  [phone]  CC: Primary care physician; Guadalupe Maple, MD   Note: This dictation was prepared with Dragon dictation along with smaller phrase technology. Any transcriptional errors that result from this process are unintentional.

## 2016-12-29 NOTE — Care Management Important Message (Signed)
Important Message  Patient Details  Name: Jennifer Murray MRN: [id number] Date of Birth: 1927-12-31   Medicare Important Message Given:  Yes    Shelbie Ammons, RN 12/29/2016, 8:50 AM

## 2016-12-29 NOTE — Clinical Social Work Placement (Signed)
   CLINICAL SOCIAL WORK PLACEMENT  NOTE  Date:  12/29/2016  Patient Details  Name: Jennifer Murray MRN: [id number] Date of Birth: [birth year]  Clinical Social Work is seeking post-discharge placement for this patient at the [place] level of care (*CSW will initial, date and re-position this form in  chart as items are completed):  Yes   Patient/family provided with [place] Work Department's list of facilities offering this level of care within the geographic area requested by the patient (or if unable, by the patient's family).  Yes   Patient/family informed of their freedom to choose among providers that offer the needed level of care, that participate in Medicare, Medicaid or managed care program needed by the patient, have an available bed and are willing to accept the patient.  Yes   Patient/family informed of [place]'s ownership interest in [facility] and [facility], as well as of the fact that they are under no obligation to receive care at these facilities.  PASRR submitted to EDS on       PASRR number received on       Existing PASRR number confirmed on 12/27/16     FL2 transmitted to all facilities in geographic area requested by pt/family on 12/27/16     FL2 transmitted to all facilities within larger geographic area on       Patient informed that his/her managed care company has contracts with or will negotiate with certain facilities, including the following:        Yes   Patient/family informed of bed offers received.  Patient chooses bed at [facility]     Physician recommends and patient chooses bed at      Patient to be transferred to [facility] on 12/29/16.  Patient to be transferred to facility by [place]     Patient family notified on 12/29/16 of transfer.  Name of family member notified:  Pt's daughters.      PHYSICIAN Please sign DNR     Additional Comment:     _______________________________________________ Darden Dates, LCSW 12/29/2016, 11:39 AM

## 2016-12-31 ENCOUNTER — Telehealth: Payer: Self-pay

## 2016-12-31 DIAGNOSIS — Z8673 Personal history of transient ischemic attack (TIA), and cerebral infarction without residual deficits: Secondary | ICD-10-CM | POA: Diagnosis not present

## 2016-12-31 DIAGNOSIS — K5900 Constipation, unspecified: Secondary | ICD-10-CM | POA: Diagnosis not present

## 2016-12-31 DIAGNOSIS — N390 Urinary tract infection, site not specified: Secondary | ICD-10-CM | POA: Diagnosis not present

## 2016-12-31 DIAGNOSIS — R1310 Dysphagia, unspecified: Secondary | ICD-10-CM | POA: Diagnosis not present

## 2016-12-31 DIAGNOSIS — M6281 Muscle weakness (generalized): Secondary | ICD-10-CM | POA: Diagnosis not present

## 2016-12-31 NOTE — Telephone Encounter (Signed)
I have made the 1st attempt to contact the patient or family member in charge, in order to follow up from recently being discharged from the hospital. I left a message on voicemail but I will make another attempt at a different time.  

## 2017-01-01 NOTE — Telephone Encounter (Signed)
I have made the 2nd attempt to contact the patient or family member in charge, in order to follow up from recently being discharged from the hospital. I left a message on voicemail but I will make another attempt at a different time.  

## 2017-01-05 ENCOUNTER — Inpatient Hospital Stay: Payer: Medicare Other | Admitting: Family Medicine

## 2017-01-06 DIAGNOSIS — N390 Urinary tract infection, site not specified: Secondary | ICD-10-CM | POA: Diagnosis not present

## 2017-01-06 DIAGNOSIS — K5900 Constipation, unspecified: Secondary | ICD-10-CM | POA: Diagnosis not present

## 2017-01-06 DIAGNOSIS — M6282 Rhabdomyolysis: Secondary | ICD-10-CM | POA: Diagnosis not present

## 2017-01-06 DIAGNOSIS — M6281 Muscle weakness (generalized): Secondary | ICD-10-CM | POA: Diagnosis not present

## 2017-01-06 DIAGNOSIS — Z8673 Personal history of transient ischemic attack (TIA), and cerebral infarction without residual deficits: Secondary | ICD-10-CM | POA: Diagnosis not present

## 2017-01-06 DIAGNOSIS — R1310 Dysphagia, unspecified: Secondary | ICD-10-CM | POA: Diagnosis not present

## 2017-01-06 DIAGNOSIS — I890 Lymphedema, not elsewhere classified: Secondary | ICD-10-CM | POA: Diagnosis not present

## 2017-01-14 DIAGNOSIS — M19011 Primary osteoarthritis, right shoulder: Secondary | ICD-10-CM | POA: Diagnosis not present

## 2017-01-14 DIAGNOSIS — E669 Obesity, unspecified: Secondary | ICD-10-CM | POA: Diagnosis not present

## 2017-01-14 DIAGNOSIS — M19012 Primary osteoarthritis, left shoulder: Secondary | ICD-10-CM | POA: Diagnosis not present

## 2017-01-15 DIAGNOSIS — K5900 Constipation, unspecified: Secondary | ICD-10-CM | POA: Diagnosis not present

## 2017-01-15 DIAGNOSIS — Z8673 Personal history of transient ischemic attack (TIA), and cerebral infarction without residual deficits: Secondary | ICD-10-CM | POA: Diagnosis not present

## 2017-01-15 DIAGNOSIS — Z7901 Long term (current) use of anticoagulants: Secondary | ICD-10-CM | POA: Diagnosis not present

## 2017-01-15 DIAGNOSIS — R1310 Dysphagia, unspecified: Secondary | ICD-10-CM | POA: Diagnosis not present

## 2017-01-15 DIAGNOSIS — M6281 Muscle weakness (generalized): Secondary | ICD-10-CM | POA: Diagnosis not present

## 2017-01-20 ENCOUNTER — Telehealth: Payer: Self-pay | Admitting: Family Medicine

## 2017-01-20 NOTE — Telephone Encounter (Signed)
Called pt to schedule for Annual Wellness Visit with Nurse Health Advisor, Tiffany Hill, my c/b # is [phone]  Kathryn Brown ° °

## 2017-01-22 ENCOUNTER — Other Ambulatory Visit: Payer: Self-pay | Admitting: *Deleted

## 2017-01-22 NOTE — Patient Outreach (Signed)
[place] [facility]) Care Management  01/22/2017  Jennifer Murray [birth year] [id number]   Met with [name], SW at facility.  He states patient progressing. She has supportive family. Patient independent and still working at her own newspaper writing a weekly column.  He anticipates patient to be there a couple more weeks. He states he will set up home care services.   Attempted to meet with patient in her room, she was not in her room. Will attempt to meet her next facility visit. Royetta Crochet. Laymond Purser, RN, BSN, [place] [phone]) Business Cell  [phone]) Toll Free Office

## 2017-01-28 DIAGNOSIS — R1310 Dysphagia, unspecified: Secondary | ICD-10-CM | POA: Diagnosis not present

## 2017-01-28 DIAGNOSIS — K5900 Constipation, unspecified: Secondary | ICD-10-CM | POA: Diagnosis not present

## 2017-01-28 DIAGNOSIS — R531 Weakness: Secondary | ICD-10-CM | POA: Diagnosis not present

## 2017-01-28 DIAGNOSIS — Z8673 Personal history of transient ischemic attack (TIA), and cerebral infarction without residual deficits: Secondary | ICD-10-CM | POA: Diagnosis not present

## 2017-01-29 ENCOUNTER — Other Ambulatory Visit: Payer: Self-pay | Admitting: *Deleted

## 2017-01-29 NOTE — Patient Outreach (Signed)
[place] [facility]) Care Management  01/29/2017  Jennifer Murray 09/24/1927 [id number]   Met with patient at facility.  Patient reports she is going home next week. 9/15. She reports she has adult grandchildren living with her to assist her.  She has transportation. She reports only one prescription medication and takes vitamins.   Patient does want to have some private pay assistance when she goes home a few days a week. Patient does not feel she needs care management but accepted brochure and RNCM contact for future reference.   RNCM reviewed [facility] care management.  RNCM instructed patient to speak with facility SW regarding home care services.   Plan to sign off. Royetta Crochet. Laymond Purser, RN, BSN, [place] [phone]) Business Cell  [phone]) Toll Free Office

## 2017-02-05 ENCOUNTER — Telehealth (INDEPENDENT_AMBULATORY_CARE_PROVIDER_SITE_OTHER): Payer: Self-pay

## 2017-02-05 NOTE — Telephone Encounter (Signed)
Patients daughter called wanting to know if there is a difference in the patient wearing velcro compression versus compression hose and how often they should be replaced. I explained that both will do what is needed which is provide compression, the velcro will be easier for the patient to place alone versus the hose which would require a little force. I also let her know that speaking with someone that sells velcro and hose compression will also give her much needed insight to the length of time before replacing them and differences.

## 2017-02-08 DIAGNOSIS — I2510 Atherosclerotic heart disease of native coronary artery without angina pectoris: Secondary | ICD-10-CM | POA: Diagnosis not present

## 2017-02-08 DIAGNOSIS — M4800 Spinal stenosis, site unspecified: Secondary | ICD-10-CM | POA: Diagnosis not present

## 2017-02-08 DIAGNOSIS — I10 Essential (primary) hypertension: Secondary | ICD-10-CM | POA: Diagnosis not present

## 2017-02-08 DIAGNOSIS — M6281 Muscle weakness (generalized): Secondary | ICD-10-CM | POA: Diagnosis not present

## 2017-02-08 DIAGNOSIS — M1991 Primary osteoarthritis, unspecified site: Secondary | ICD-10-CM | POA: Diagnosis not present

## 2017-02-08 DIAGNOSIS — I872 Venous insufficiency (chronic) (peripheral): Secondary | ICD-10-CM | POA: Diagnosis not present

## 2017-02-08 DIAGNOSIS — M6282 Rhabdomyolysis: Secondary | ICD-10-CM | POA: Diagnosis not present

## 2017-02-08 DIAGNOSIS — Z9181 History of falling: Secondary | ICD-10-CM | POA: Diagnosis not present

## 2017-02-08 DIAGNOSIS — I69398 Other sequelae of cerebral infarction: Secondary | ICD-10-CM | POA: Diagnosis not present

## 2017-02-09 ENCOUNTER — Telehealth: Payer: Self-pay | Admitting: Family Medicine

## 2017-02-09 DIAGNOSIS — M6281 Muscle weakness (generalized): Secondary | ICD-10-CM | POA: Diagnosis not present

## 2017-02-09 DIAGNOSIS — I872 Venous insufficiency (chronic) (peripheral): Secondary | ICD-10-CM | POA: Diagnosis not present

## 2017-02-09 DIAGNOSIS — I2510 Atherosclerotic heart disease of native coronary artery without angina pectoris: Secondary | ICD-10-CM | POA: Diagnosis not present

## 2017-02-09 DIAGNOSIS — M1991 Primary osteoarthritis, unspecified site: Secondary | ICD-10-CM | POA: Diagnosis not present

## 2017-02-09 DIAGNOSIS — I10 Essential (primary) hypertension: Secondary | ICD-10-CM | POA: Diagnosis not present

## 2017-02-09 DIAGNOSIS — I69398 Other sequelae of cerebral infarction: Secondary | ICD-10-CM | POA: Diagnosis not present

## 2017-02-09 NOTE — Telephone Encounter (Signed)
Noted  

## 2017-02-09 NOTE — Telephone Encounter (Signed)
Kim with [facility] called to let Dr Jeananne Rama know that the patient has been admitted to their services from [facility] for OT/PT, nurse care, homehealth  Just FYI

## 2017-02-11 DIAGNOSIS — M6281 Muscle weakness (generalized): Secondary | ICD-10-CM | POA: Diagnosis not present

## 2017-02-11 DIAGNOSIS — I872 Venous insufficiency (chronic) (peripheral): Secondary | ICD-10-CM | POA: Diagnosis not present

## 2017-02-11 DIAGNOSIS — I10 Essential (primary) hypertension: Secondary | ICD-10-CM | POA: Diagnosis not present

## 2017-02-11 DIAGNOSIS — M1991 Primary osteoarthritis, unspecified site: Secondary | ICD-10-CM | POA: Diagnosis not present

## 2017-02-11 DIAGNOSIS — I69398 Other sequelae of cerebral infarction: Secondary | ICD-10-CM | POA: Diagnosis not present

## 2017-02-11 DIAGNOSIS — I2510 Atherosclerotic heart disease of native coronary artery without angina pectoris: Secondary | ICD-10-CM | POA: Diagnosis not present

## 2017-02-13 ENCOUNTER — Encounter: Payer: Self-pay | Admitting: Family Medicine

## 2017-02-13 ENCOUNTER — Ambulatory Visit (INDEPENDENT_AMBULATORY_CARE_PROVIDER_SITE_OTHER): Payer: Medicare Other | Admitting: Family Medicine

## 2017-02-13 VITALS — BP 97/67 | HR 76 | Temp 98.0°F

## 2017-02-13 DIAGNOSIS — I872 Venous insufficiency (chronic) (peripheral): Secondary | ICD-10-CM | POA: Diagnosis not present

## 2017-02-13 DIAGNOSIS — R351 Nocturia: Secondary | ICD-10-CM | POA: Diagnosis not present

## 2017-02-13 DIAGNOSIS — I890 Lymphedema, not elsewhere classified: Secondary | ICD-10-CM

## 2017-02-13 DIAGNOSIS — L02224 Furuncle of groin: Secondary | ICD-10-CM | POA: Diagnosis not present

## 2017-02-13 DIAGNOSIS — R21 Rash and other nonspecific skin eruption: Secondary | ICD-10-CM

## 2017-02-13 DIAGNOSIS — I6523 Occlusion and stenosis of bilateral carotid arteries: Secondary | ICD-10-CM

## 2017-02-13 DIAGNOSIS — I69398 Other sequelae of cerebral infarction: Secondary | ICD-10-CM | POA: Diagnosis not present

## 2017-02-13 DIAGNOSIS — M1991 Primary osteoarthritis, unspecified site: Secondary | ICD-10-CM | POA: Diagnosis not present

## 2017-02-13 DIAGNOSIS — I2510 Atherosclerotic heart disease of native coronary artery without angina pectoris: Secondary | ICD-10-CM | POA: Diagnosis not present

## 2017-02-13 DIAGNOSIS — I10 Essential (primary) hypertension: Secondary | ICD-10-CM | POA: Diagnosis not present

## 2017-02-13 DIAGNOSIS — M6281 Muscle weakness (generalized): Secondary | ICD-10-CM | POA: Diagnosis not present

## 2017-02-13 MED ORDER — CEPHALEXIN 250 MG PO CAPS: 250.0000 mg | ORAL_CAPSULE | Freq: Three times a day (TID) | ORAL | 0 refills | Status: AC

## 2017-02-13 MED ORDER — TOLTERODINE TARTRATE 1 MG PO TABS: 1.0000 mg | ORAL_TABLET | Freq: Every evening | ORAL | 1 refills | Status: DC | PRN

## 2017-02-13 NOTE — Patient Instructions (Addendum)
Tolterodine tablets What is this medicine? TOLTERODINE (tole TER a deen) is used to treat overactive bladder. This medicine reduces the amount of bathroom visits. It may also help to control wetting accidents. This medicine may be used for other purposes; ask your health care provider or pharmacist if you have questions. COMMON BRAND NAME(S): Detrol What should I tell my health care provider before I take this medicine? They need to know if you have any of these conditions: -difficulty passing urine -glaucoma -intestinal obstruction -irregular heartbeat or you have a family member with irregular heartbeat -kidney disease -liver disease -myasthenia gravis -an unusual or allergic reaction to tolterodine, fesoterodine, other medicines, foods, dyes, or preservatives -pregnant or trying to get pregnant -breast-feeding How should I use this medicine? Take this medicine by mouth with a glass of water. Follow the directions on the prescription label. Take your doses at regular intervals. Do not take your medicine more often than directed. Talk to your pediatrician regarding the use of this medicine in children. Special care may be needed. Overdosage: If you think you have taken too much of this medicine contact a poison control center or emergency room at once. NOTE: This medicine is only for you. Do not share this medicine with others. What if I miss a dose? If you miss a dose, take it as soon as you can. If it is almost time for your next dose, take only that dose. Do not take double or extra doses. What may interact with this medicine? -clarithromycin -cyclosporine -erythromycin -fluoxetine -medicines for fungal infections, like fluconazole, itraconazole, ketoconazole or voriconazole -vinblastine This list may not describe all possible interactions. Give your health care provider a list of all the medicines, herbs, non-prescription drugs, or dietary supplements you use. Also tell them if  you smoke, drink alcohol, or use illegal drugs. Some items may interact with your medicine. What should I watch for while using this medicine? It may take 2 or 3 months to notice the full benefit from this medicine. You may need to limit your intake tea, coffee, caffeinated sodas, and alcohol. These drinks may make your symptoms worse. You may get drowsy or dizzy. Do not drive, use machinery, or do anything that needs mental alertness until you know how this drug affects you. Do not stand or sit up quickly, especially if you are an older patient. This reduces the risk of dizzy or fainting spells. Your mouth may get dry. Chewing sugarless gum or sucking hard candy, and drinking plenty of water may help. Contact your doctor if the problem does not go away or is severe. This medicine may cause dry eyes and blurred vision. If you wear contact lenses you may feel some discomfort. Lubricating drops may help. See your eye doctor if the problem does not go away or is severe. Avoid extreme heat. This medicine can cause you to sweat less than normal. Your body temperature could increase to dangerous levels, which may lead to heat stroke. What side effects may I notice from receiving this medicine? Side effects that you should report to your doctor or health care professional as soon as possible: -allergic reactions like skin rash, itching or hives, swelling of the face, lips, or tongue -breathing problems -confusion -difficulty passing urine -fast, irregular heartbeat -hallucinations -swelling in feet, hands Side effects that usually do not require medical attention (report to your doctor or health care professional if they continue or are bothersome): -changes in vision -constipation -dry eyes, mouth -headache -dizziness, drowsiness -stomach  upset This list may not describe all possible side effects. Call your doctor for medical advice about side effects. You may report side effects to FDA at  1-800-FDA-1088. Where should I keep my medicine? Keep out of the reach of children. Store at room temperature between 15 and 30 degrees C (59 and 86 degrees F). Throw away any unused medicine after the expiration date. NOTE: This sheet is a summary. It may not cover all possible information. If you have questions about this medicine, talk to your doctor, pharmacist, or health care provider.  2018 Elsevier/Gold Standard (2010-02-19 17:19:08) Tolterodine tablets What is this medicine? TOLTERODINE (tole TER a deen) is used to treat overactive bladder. This medicine reduces the amount of bathroom visits. It may also help to control wetting accidents. This medicine may be used for other purposes; ask your health care provider or pharmacist if you have questions. COMMON BRAND NAME(S): Detrol What should I tell my health care provider before I take this medicine? They need to know if you have any of these conditions: -difficulty passing urine -glaucoma -intestinal obstruction -irregular heartbeat or you have a family member with irregular heartbeat -kidney disease -liver disease -myasthenia gravis -an unusual or allergic reaction to tolterodine, fesoterodine, other medicines, foods, dyes, or preservatives -pregnant or trying to get pregnant -breast-feeding How should I use this medicine? Take this medicine by mouth with a glass of water. Follow the directions on the prescription label. Take your doses at regular intervals. Do not take your medicine more often than directed. Talk to your pediatrician regarding the use of this medicine in children. Special care may be needed. Overdosage: If you think you have taken too much of this medicine contact a poison control center or emergency room at once. NOTE: This medicine is only for you. Do not share this medicine with others. What if I miss a dose? If you miss a dose, take it as soon as you can. If it is almost time for your next dose, take only  that dose. Do not take double or extra doses. What may interact with this medicine? -clarithromycin -cyclosporine -erythromycin -fluoxetine -medicines for fungal infections, like fluconazole, itraconazole, ketoconazole or voriconazole -vinblastine This list may not describe all possible interactions. Give your health care provider a list of all the medicines, herbs, non-prescription drugs, or dietary supplements you use. Also tell them if you smoke, drink alcohol, or use illegal drugs. Some items may interact with your medicine. What should I watch for while using this medicine? It may take 2 or 3 months to notice the full benefit from this medicine. You may need to limit your intake tea, coffee, caffeinated sodas, and alcohol. These drinks may make your symptoms worse. You may get drowsy or dizzy. Do not drive, use machinery, or do anything that needs mental alertness until you know how this drug affects you. Do not stand or sit up quickly, especially if you are an older patient. This reduces the risk of dizzy or fainting spells. Your mouth may get dry. Chewing sugarless gum or sucking hard candy, and drinking plenty of water may help. Contact your doctor if the problem does not go away or is severe. This medicine may cause dry eyes and blurred vision. If you wear contact lenses you may feel some discomfort. Lubricating drops may help. See your eye doctor if the problem does not go away or is severe. Avoid extreme heat. This medicine can cause you to sweat less than normal. Your body temperature  could increase to dangerous levels, which may lead to heat stroke. What side effects may I notice from receiving this medicine? Side effects that you should report to your doctor or health care professional as soon as possible: -allergic reactions like skin rash, itching or hives, swelling of the face, lips, or tongue -breathing problems -confusion -difficulty passing urine -fast, irregular  heartbeat -hallucinations -swelling in feet, hands Side effects that usually do not require medical attention (report to your doctor or health care professional if they continue or are bothersome): -changes in vision -constipation -dry eyes, mouth -headache -dizziness, drowsiness -stomach upset This list may not describe all possible side effects. Call your doctor for medical advice about side effects. You may report side effects to FDA at 1-800-FDA-1088. Where should I keep my medicine? Keep out of the reach of children. Store at room temperature between 15 and 30 degrees C (59 and 86 degrees F). Throw away any unused medicine after the expiration date. NOTE: This sheet is a summary. It may not cover all possible information. If you have questions about this medicine, talk to your doctor, pharmacist, or health care provider.  2018 Elsevier/Gold Standard (2010-02-19 17:19:08)

## 2017-02-13 NOTE — Progress Notes (Signed)
BP 97/67   Pulse 76   Temp 98 F (36.7 C)   SpO2 97%    Subjective:    Patient ID: Jennifer Murray, female    DOB: 09/30/1927, 83 y.o.   MRN: [id number]  HPI: Jennifer Murray is a 83 y.o. female  Chief Complaint  Patient presents with  . Follow-up    Was admitted for Rhabdomyosis and sent to SNF for rehab. Still has complaints of feeling weak at times.   . Urinary Frequency    interested in a rx for Detrol  . Wound Check    leg wound, spot in her vaginal area has been applying neosporin to both areas.  . Edema    interested in a rx for a fluid pill   Patient presents today for hospital/SNF f/u after a fall onto her hip, rhabdomyolysis, and sepsis from UTI. Doing a fair amount better since starting rehab. UTI cleared with keflex per recent labs prior to SNF d/c, but the past few months or so notes nocturia x 5-6 times nightly where she's having to get up and walk to the bathroom. Very fatigued in the daytime as she's up almost every hour to urinate. Does not want a bedside commode.   Still having significant lymphedema b/l LEs,using compression braces daily for that. Also noting some patchy redness at left ankle,itches occasionally but does not always bother her. Family wanting to discuss diuretic medication to help with this.   Caregivers noted a boil in her right groin area that they've been putting neosporin on. Starting to becoming painful and very inflamed. Denies fever, chills, drainage.   Past Medical History:  Diagnosis Date  . Acute cerebrovascular insufficiency transient focal neurologic deficit 2003  . Arthritis   . Cancer (HCC)    Basal Cell  . Diverticulosis   . Leg edema   . Lumbar spinal stenosis   . Obesity   . Osteoporosis   . Stroke [facility])    Social History   Social History  . Marital status: Widowed    Spouse name: N/A  . Number of children: N/A  . Years of education: N/A   Occupational History  . Not on file.   Social History Main Topics  . Smoking  status: Passive Smoke Exposure - Never Smoker  . Smokeless tobacco: Never Used  . Alcohol use No  . Drug use: No  . Sexual activity: No   Other Topics Concern  . Not on file   Social History Narrative  . No narrative on file   Relevant past medical, surgical, family and social history reviewed and updated as indicated. Interim medical history since our last visit reviewed. Allergies and medications reviewed and updated.  Review of Systems  Constitutional: Positive for fatigue.  HENT: Negative.   Respiratory: Negative.   Cardiovascular: Positive for leg swelling (chronic lymphedema ).  Gastrointestinal: Negative.   Genitourinary: Positive for frequency and vaginal pain (from boil).  Neurological: Positive for weakness.  Psychiatric/Behavioral: Negative.     Per HPI unless specifically indicated above     Objective:    BP 97/67   Pulse 76   Temp 98 F (36.7 C)   SpO2 97%   Wt Readings from Last 3 Encounters:  12/29/16 170 lb (77.1 kg)  12/18/16 238 lb (108 kg)  09/30/16 216 lb 8 oz (98.2 kg)    Physical Exam  Constitutional: She appears well-developed and well-nourished.  HENT:  Head: Atraumatic.  Eyes: Pupils are equal, round,  and reactive to light. Conjunctivae are normal.  Neck: Normal range of motion. Neck supple.  Cardiovascular: Normal rate and normal heart sounds.   Pulmonary/Chest: Effort normal and breath sounds normal. No respiratory distress.  Genitourinary:  Genitourinary Comments: Attempted to examine right groin/labial area but pt unable to hold ambulation long enough or lay on exam table for me to visualize boil in office  Musculoskeletal: She exhibits edema (Significant lymphedema b/l LEs). She exhibits no tenderness.  In wheelchair, difficulty ambulating d/t weakness from recent hospitalization  Neurological: She is alert.  Skin: Skin is warm and dry. Rash (minimal macular patch at left ankle) noted.  Psychiatric: She has a normal mood and affect.  Her behavior is normal.  Nursing note and vitals reviewed.     Assessment & Plan:   Problem List Items Addressed This Visit    None    Visit Diagnoses    Nocturia    -  Primary   Lymphedema       Appears unchanged from previous. Discussed with family why a diuretic is a poor option, particularly with everything else going on. Cont. compression, elevation   Rash       Suspect just irritation from heat and her compression braces. Will send some triamcinolone cream to apply to the area. F/u if no improvement   Boil of groin       Unable to visualize today. Continue neosporin, warm compresses, will start keflex. F/u if worsening, home health to check daily as able   Relevant Medications   cephALEXin (KEFLEX) 250 MG capsule    Long discussion with pt and family members about safety balance with adding a bladder medicine or continuing current habits of walking to restroom 5-6 times nightly and having poor sleep/daytime somnolence. Strongly recommended they revisit a bedside commode regardless, even though her bathroom is only about 10 steps away. Will send low dose detrol for bedtime use if they decide to try it. Pt has home health assistance currently so may be a good time for a trial. Reviewed risks and side effects at length, including cognitive and sedation risks. Stop drinking water at around 7 pm, push fluids during the day.    Follow up plan: Return for as scheduled.

## 2017-02-16 DIAGNOSIS — M1991 Primary osteoarthritis, unspecified site: Secondary | ICD-10-CM | POA: Diagnosis not present

## 2017-02-16 DIAGNOSIS — M6281 Muscle weakness (generalized): Secondary | ICD-10-CM | POA: Diagnosis not present

## 2017-02-16 DIAGNOSIS — I872 Venous insufficiency (chronic) (peripheral): Secondary | ICD-10-CM | POA: Diagnosis not present

## 2017-02-16 DIAGNOSIS — I69398 Other sequelae of cerebral infarction: Secondary | ICD-10-CM | POA: Diagnosis not present

## 2017-02-16 DIAGNOSIS — I2510 Atherosclerotic heart disease of native coronary artery without angina pectoris: Secondary | ICD-10-CM | POA: Diagnosis not present

## 2017-02-16 DIAGNOSIS — I10 Essential (primary) hypertension: Secondary | ICD-10-CM | POA: Diagnosis not present

## 2017-02-17 DIAGNOSIS — M6281 Muscle weakness (generalized): Secondary | ICD-10-CM | POA: Diagnosis not present

## 2017-02-17 DIAGNOSIS — I2510 Atherosclerotic heart disease of native coronary artery without angina pectoris: Secondary | ICD-10-CM | POA: Diagnosis not present

## 2017-02-17 DIAGNOSIS — I10 Essential (primary) hypertension: Secondary | ICD-10-CM | POA: Diagnosis not present

## 2017-02-17 DIAGNOSIS — I69398 Other sequelae of cerebral infarction: Secondary | ICD-10-CM | POA: Diagnosis not present

## 2017-02-17 DIAGNOSIS — I872 Venous insufficiency (chronic) (peripheral): Secondary | ICD-10-CM | POA: Diagnosis not present

## 2017-02-17 DIAGNOSIS — M1991 Primary osteoarthritis, unspecified site: Secondary | ICD-10-CM | POA: Diagnosis not present

## 2017-02-18 DIAGNOSIS — I872 Venous insufficiency (chronic) (peripheral): Secondary | ICD-10-CM | POA: Diagnosis not present

## 2017-02-18 DIAGNOSIS — I10 Essential (primary) hypertension: Secondary | ICD-10-CM | POA: Diagnosis not present

## 2017-02-18 DIAGNOSIS — I2510 Atherosclerotic heart disease of native coronary artery without angina pectoris: Secondary | ICD-10-CM | POA: Diagnosis not present

## 2017-02-18 DIAGNOSIS — M1991 Primary osteoarthritis, unspecified site: Secondary | ICD-10-CM | POA: Diagnosis not present

## 2017-02-18 DIAGNOSIS — I69398 Other sequelae of cerebral infarction: Secondary | ICD-10-CM | POA: Diagnosis not present

## 2017-02-18 DIAGNOSIS — M6281 Muscle weakness (generalized): Secondary | ICD-10-CM | POA: Diagnosis not present

## 2017-02-20 DIAGNOSIS — I10 Essential (primary) hypertension: Secondary | ICD-10-CM | POA: Diagnosis not present

## 2017-02-20 DIAGNOSIS — M6281 Muscle weakness (generalized): Secondary | ICD-10-CM | POA: Diagnosis not present

## 2017-02-20 DIAGNOSIS — I69398 Other sequelae of cerebral infarction: Secondary | ICD-10-CM | POA: Diagnosis not present

## 2017-02-20 DIAGNOSIS — M1991 Primary osteoarthritis, unspecified site: Secondary | ICD-10-CM | POA: Diagnosis not present

## 2017-02-20 DIAGNOSIS — I872 Venous insufficiency (chronic) (peripheral): Secondary | ICD-10-CM | POA: Diagnosis not present

## 2017-02-20 DIAGNOSIS — I2510 Atherosclerotic heart disease of native coronary artery without angina pectoris: Secondary | ICD-10-CM | POA: Diagnosis not present

## 2017-02-24 DIAGNOSIS — I872 Venous insufficiency (chronic) (peripheral): Secondary | ICD-10-CM | POA: Diagnosis not present

## 2017-02-24 DIAGNOSIS — I69398 Other sequelae of cerebral infarction: Secondary | ICD-10-CM | POA: Diagnosis not present

## 2017-02-24 DIAGNOSIS — M6281 Muscle weakness (generalized): Secondary | ICD-10-CM | POA: Diagnosis not present

## 2017-02-24 DIAGNOSIS — I2510 Atherosclerotic heart disease of native coronary artery without angina pectoris: Secondary | ICD-10-CM | POA: Diagnosis not present

## 2017-02-24 DIAGNOSIS — I10 Essential (primary) hypertension: Secondary | ICD-10-CM | POA: Diagnosis not present

## 2017-02-24 DIAGNOSIS — M1991 Primary osteoarthritis, unspecified site: Secondary | ICD-10-CM | POA: Diagnosis not present

## 2017-02-25 DIAGNOSIS — I10 Essential (primary) hypertension: Secondary | ICD-10-CM | POA: Diagnosis not present

## 2017-02-25 DIAGNOSIS — M6281 Muscle weakness (generalized): Secondary | ICD-10-CM | POA: Diagnosis not present

## 2017-02-25 DIAGNOSIS — M1991 Primary osteoarthritis, unspecified site: Secondary | ICD-10-CM | POA: Diagnosis not present

## 2017-02-25 DIAGNOSIS — I69398 Other sequelae of cerebral infarction: Secondary | ICD-10-CM | POA: Diagnosis not present

## 2017-02-25 DIAGNOSIS — I2510 Atherosclerotic heart disease of native coronary artery without angina pectoris: Secondary | ICD-10-CM | POA: Diagnosis not present

## 2017-02-25 DIAGNOSIS — I872 Venous insufficiency (chronic) (peripheral): Secondary | ICD-10-CM | POA: Diagnosis not present

## 2017-02-27 ENCOUNTER — Other Ambulatory Visit: Payer: Self-pay | Admitting: Family Medicine

## 2017-02-27 DIAGNOSIS — I10 Essential (primary) hypertension: Secondary | ICD-10-CM | POA: Diagnosis not present

## 2017-02-27 DIAGNOSIS — I69398 Other sequelae of cerebral infarction: Secondary | ICD-10-CM | POA: Diagnosis not present

## 2017-02-27 DIAGNOSIS — M6281 Muscle weakness (generalized): Secondary | ICD-10-CM | POA: Diagnosis not present

## 2017-02-27 DIAGNOSIS — I2510 Atherosclerotic heart disease of native coronary artery without angina pectoris: Secondary | ICD-10-CM | POA: Diagnosis not present

## 2017-02-27 DIAGNOSIS — I872 Venous insufficiency (chronic) (peripheral): Secondary | ICD-10-CM | POA: Diagnosis not present

## 2017-02-27 DIAGNOSIS — M1991 Primary osteoarthritis, unspecified site: Secondary | ICD-10-CM | POA: Diagnosis not present

## 2017-02-27 MED ORDER — NYSTATIN 100000 UNIT/GM EX POWD: Freq: Four times a day (QID) | CUTANEOUS | 0 refills | Status: DC

## 2017-02-27 NOTE — Telephone Encounter (Addendum)
Tiffany with [facility] called to get someone to send in a script for nystatin powder due to patient has fungal rash under the folds of her stomach.  Tarheel Ellis Parents if you need more information [phone]  Thanks

## 2017-02-27 NOTE — Telephone Encounter (Signed)
Please adivse

## 2017-02-28 DIAGNOSIS — I2510 Atherosclerotic heart disease of native coronary artery without angina pectoris: Secondary | ICD-10-CM | POA: Diagnosis not present

## 2017-02-28 DIAGNOSIS — I10 Essential (primary) hypertension: Secondary | ICD-10-CM | POA: Diagnosis not present

## 2017-02-28 DIAGNOSIS — M6281 Muscle weakness (generalized): Secondary | ICD-10-CM | POA: Diagnosis not present

## 2017-02-28 DIAGNOSIS — M1991 Primary osteoarthritis, unspecified site: Secondary | ICD-10-CM | POA: Diagnosis not present

## 2017-02-28 DIAGNOSIS — I872 Venous insufficiency (chronic) (peripheral): Secondary | ICD-10-CM | POA: Diagnosis not present

## 2017-02-28 DIAGNOSIS — I69398 Other sequelae of cerebral infarction: Secondary | ICD-10-CM | POA: Diagnosis not present

## 2017-03-03 DIAGNOSIS — I69398 Other sequelae of cerebral infarction: Secondary | ICD-10-CM | POA: Diagnosis not present

## 2017-03-03 DIAGNOSIS — M1991 Primary osteoarthritis, unspecified site: Secondary | ICD-10-CM | POA: Diagnosis not present

## 2017-03-03 DIAGNOSIS — I2510 Atherosclerotic heart disease of native coronary artery without angina pectoris: Secondary | ICD-10-CM | POA: Diagnosis not present

## 2017-03-03 DIAGNOSIS — I10 Essential (primary) hypertension: Secondary | ICD-10-CM | POA: Diagnosis not present

## 2017-03-03 DIAGNOSIS — M6281 Muscle weakness (generalized): Secondary | ICD-10-CM | POA: Diagnosis not present

## 2017-03-03 DIAGNOSIS — I872 Venous insufficiency (chronic) (peripheral): Secondary | ICD-10-CM | POA: Diagnosis not present

## 2017-03-04 DIAGNOSIS — M6281 Muscle weakness (generalized): Secondary | ICD-10-CM | POA: Diagnosis not present

## 2017-03-04 DIAGNOSIS — I69398 Other sequelae of cerebral infarction: Secondary | ICD-10-CM | POA: Diagnosis not present

## 2017-03-04 DIAGNOSIS — I2510 Atherosclerotic heart disease of native coronary artery without angina pectoris: Secondary | ICD-10-CM | POA: Diagnosis not present

## 2017-03-04 DIAGNOSIS — I10 Essential (primary) hypertension: Secondary | ICD-10-CM | POA: Diagnosis not present

## 2017-03-04 DIAGNOSIS — I872 Venous insufficiency (chronic) (peripheral): Secondary | ICD-10-CM | POA: Diagnosis not present

## 2017-03-04 DIAGNOSIS — M1991 Primary osteoarthritis, unspecified site: Secondary | ICD-10-CM | POA: Diagnosis not present

## 2017-03-06 DIAGNOSIS — I10 Essential (primary) hypertension: Secondary | ICD-10-CM | POA: Diagnosis not present

## 2017-03-06 DIAGNOSIS — I2510 Atherosclerotic heart disease of native coronary artery without angina pectoris: Secondary | ICD-10-CM | POA: Diagnosis not present

## 2017-03-06 DIAGNOSIS — M6281 Muscle weakness (generalized): Secondary | ICD-10-CM | POA: Diagnosis not present

## 2017-03-06 DIAGNOSIS — I69398 Other sequelae of cerebral infarction: Secondary | ICD-10-CM | POA: Diagnosis not present

## 2017-03-06 DIAGNOSIS — I872 Venous insufficiency (chronic) (peripheral): Secondary | ICD-10-CM | POA: Diagnosis not present

## 2017-03-06 DIAGNOSIS — M1991 Primary osteoarthritis, unspecified site: Secondary | ICD-10-CM | POA: Diagnosis not present

## 2017-03-10 DIAGNOSIS — I872 Venous insufficiency (chronic) (peripheral): Secondary | ICD-10-CM | POA: Diagnosis not present

## 2017-03-10 DIAGNOSIS — M6281 Muscle weakness (generalized): Secondary | ICD-10-CM | POA: Diagnosis not present

## 2017-03-10 DIAGNOSIS — M1991 Primary osteoarthritis, unspecified site: Secondary | ICD-10-CM | POA: Diagnosis not present

## 2017-03-10 DIAGNOSIS — I69398 Other sequelae of cerebral infarction: Secondary | ICD-10-CM | POA: Diagnosis not present

## 2017-03-10 DIAGNOSIS — I10 Essential (primary) hypertension: Secondary | ICD-10-CM | POA: Diagnosis not present

## 2017-03-10 DIAGNOSIS — I2510 Atherosclerotic heart disease of native coronary artery without angina pectoris: Secondary | ICD-10-CM | POA: Diagnosis not present

## 2017-03-11 ENCOUNTER — Other Ambulatory Visit: Payer: Self-pay | Admitting: Family Medicine

## 2017-03-11 DIAGNOSIS — I10 Essential (primary) hypertension: Secondary | ICD-10-CM | POA: Diagnosis not present

## 2017-03-11 DIAGNOSIS — I2510 Atherosclerotic heart disease of native coronary artery without angina pectoris: Secondary | ICD-10-CM | POA: Diagnosis not present

## 2017-03-11 DIAGNOSIS — M6281 Muscle weakness (generalized): Secondary | ICD-10-CM | POA: Diagnosis not present

## 2017-03-11 DIAGNOSIS — I69398 Other sequelae of cerebral infarction: Secondary | ICD-10-CM | POA: Diagnosis not present

## 2017-03-11 DIAGNOSIS — I872 Venous insufficiency (chronic) (peripheral): Secondary | ICD-10-CM | POA: Diagnosis not present

## 2017-03-11 DIAGNOSIS — M1991 Primary osteoarthritis, unspecified site: Secondary | ICD-10-CM | POA: Diagnosis not present

## 2017-03-11 MED ORDER — NYSTATIN 100000 UNIT/GM EX POWD: Freq: Four times a day (QID) | CUTANEOUS | 0 refills | Status: DC

## 2017-03-11 NOTE — Telephone Encounter (Signed)
Home health nurse called to see if Dr Jeananne Rama would call in refill for the patients nystatin powder for the yeast under the folds of her stomach.  [place]  Thank you

## 2017-03-11 NOTE — Telephone Encounter (Signed)
Routing to provider  

## 2017-03-13 ENCOUNTER — Telehealth: Payer: Self-pay | Admitting: Family Medicine

## 2017-03-13 DIAGNOSIS — I69398 Other sequelae of cerebral infarction: Secondary | ICD-10-CM | POA: Diagnosis not present

## 2017-03-13 DIAGNOSIS — M6281 Muscle weakness (generalized): Secondary | ICD-10-CM | POA: Diagnosis not present

## 2017-03-13 DIAGNOSIS — I10 Essential (primary) hypertension: Secondary | ICD-10-CM | POA: Diagnosis not present

## 2017-03-13 DIAGNOSIS — I872 Venous insufficiency (chronic) (peripheral): Secondary | ICD-10-CM | POA: Diagnosis not present

## 2017-03-13 DIAGNOSIS — I2510 Atherosclerotic heart disease of native coronary artery without angina pectoris: Secondary | ICD-10-CM | POA: Diagnosis not present

## 2017-03-13 DIAGNOSIS — M1991 Primary osteoarthritis, unspecified site: Secondary | ICD-10-CM | POA: Diagnosis not present

## 2017-03-13 NOTE — Telephone Encounter (Signed)
Noted  

## 2017-03-13 NOTE — Telephone Encounter (Signed)
[facility] caregiver called to let Dr Jeananne Rama know that patient missed OT today.  Thanks

## 2017-03-13 NOTE — Telephone Encounter (Signed)
Noted. Will forward to provider for notice.

## 2017-03-16 ENCOUNTER — Other Ambulatory Visit: Payer: Self-pay | Admitting: Family Medicine

## 2017-03-16 DIAGNOSIS — M6281 Muscle weakness (generalized): Secondary | ICD-10-CM | POA: Diagnosis not present

## 2017-03-16 DIAGNOSIS — I2510 Atherosclerotic heart disease of native coronary artery without angina pectoris: Secondary | ICD-10-CM | POA: Diagnosis not present

## 2017-03-16 DIAGNOSIS — I69398 Other sequelae of cerebral infarction: Secondary | ICD-10-CM | POA: Diagnosis not present

## 2017-03-16 DIAGNOSIS — M1991 Primary osteoarthritis, unspecified site: Secondary | ICD-10-CM | POA: Diagnosis not present

## 2017-03-16 DIAGNOSIS — I10 Essential (primary) hypertension: Secondary | ICD-10-CM | POA: Diagnosis not present

## 2017-03-16 DIAGNOSIS — I872 Venous insufficiency (chronic) (peripheral): Secondary | ICD-10-CM | POA: Diagnosis not present

## 2017-03-16 NOTE — Telephone Encounter (Signed)
Routing to provider. Appt on 05/06/17.

## 2017-03-17 DIAGNOSIS — I10 Essential (primary) hypertension: Secondary | ICD-10-CM | POA: Diagnosis not present

## 2017-03-17 DIAGNOSIS — I69398 Other sequelae of cerebral infarction: Secondary | ICD-10-CM | POA: Diagnosis not present

## 2017-03-17 DIAGNOSIS — M1991 Primary osteoarthritis, unspecified site: Secondary | ICD-10-CM | POA: Diagnosis not present

## 2017-03-17 DIAGNOSIS — I872 Venous insufficiency (chronic) (peripheral): Secondary | ICD-10-CM | POA: Diagnosis not present

## 2017-03-17 DIAGNOSIS — M6281 Muscle weakness (generalized): Secondary | ICD-10-CM | POA: Diagnosis not present

## 2017-03-17 DIAGNOSIS — I2510 Atherosclerotic heart disease of native coronary artery without angina pectoris: Secondary | ICD-10-CM | POA: Diagnosis not present

## 2017-03-18 DIAGNOSIS — I2510 Atherosclerotic heart disease of native coronary artery without angina pectoris: Secondary | ICD-10-CM | POA: Diagnosis not present

## 2017-03-18 DIAGNOSIS — M1991 Primary osteoarthritis, unspecified site: Secondary | ICD-10-CM | POA: Diagnosis not present

## 2017-03-18 DIAGNOSIS — I872 Venous insufficiency (chronic) (peripheral): Secondary | ICD-10-CM | POA: Diagnosis not present

## 2017-03-18 DIAGNOSIS — I69398 Other sequelae of cerebral infarction: Secondary | ICD-10-CM | POA: Diagnosis not present

## 2017-03-18 DIAGNOSIS — M6281 Muscle weakness (generalized): Secondary | ICD-10-CM | POA: Diagnosis not present

## 2017-03-18 DIAGNOSIS — I10 Essential (primary) hypertension: Secondary | ICD-10-CM | POA: Diagnosis not present

## 2017-03-20 DIAGNOSIS — I872 Venous insufficiency (chronic) (peripheral): Secondary | ICD-10-CM | POA: Diagnosis not present

## 2017-03-20 DIAGNOSIS — I10 Essential (primary) hypertension: Secondary | ICD-10-CM | POA: Diagnosis not present

## 2017-03-20 DIAGNOSIS — M6281 Muscle weakness (generalized): Secondary | ICD-10-CM | POA: Diagnosis not present

## 2017-03-20 DIAGNOSIS — I69398 Other sequelae of cerebral infarction: Secondary | ICD-10-CM | POA: Diagnosis not present

## 2017-03-20 DIAGNOSIS — M1991 Primary osteoarthritis, unspecified site: Secondary | ICD-10-CM | POA: Diagnosis not present

## 2017-03-20 DIAGNOSIS — I2510 Atherosclerotic heart disease of native coronary artery without angina pectoris: Secondary | ICD-10-CM | POA: Diagnosis not present

## 2017-03-23 DIAGNOSIS — I69398 Other sequelae of cerebral infarction: Secondary | ICD-10-CM | POA: Diagnosis not present

## 2017-03-23 DIAGNOSIS — I10 Essential (primary) hypertension: Secondary | ICD-10-CM | POA: Diagnosis not present

## 2017-03-23 DIAGNOSIS — I872 Venous insufficiency (chronic) (peripheral): Secondary | ICD-10-CM | POA: Diagnosis not present

## 2017-03-23 DIAGNOSIS — M1991 Primary osteoarthritis, unspecified site: Secondary | ICD-10-CM | POA: Diagnosis not present

## 2017-03-23 DIAGNOSIS — M6281 Muscle weakness (generalized): Secondary | ICD-10-CM | POA: Diagnosis not present

## 2017-03-23 DIAGNOSIS — I2510 Atherosclerotic heart disease of native coronary artery without angina pectoris: Secondary | ICD-10-CM | POA: Diagnosis not present

## 2017-03-24 DIAGNOSIS — M6281 Muscle weakness (generalized): Secondary | ICD-10-CM | POA: Diagnosis not present

## 2017-03-24 DIAGNOSIS — M1991 Primary osteoarthritis, unspecified site: Secondary | ICD-10-CM | POA: Diagnosis not present

## 2017-03-24 DIAGNOSIS — I10 Essential (primary) hypertension: Secondary | ICD-10-CM | POA: Diagnosis not present

## 2017-03-24 DIAGNOSIS — I872 Venous insufficiency (chronic) (peripheral): Secondary | ICD-10-CM | POA: Diagnosis not present

## 2017-03-24 DIAGNOSIS — I2510 Atherosclerotic heart disease of native coronary artery without angina pectoris: Secondary | ICD-10-CM | POA: Diagnosis not present

## 2017-03-24 DIAGNOSIS — I69398 Other sequelae of cerebral infarction: Secondary | ICD-10-CM | POA: Diagnosis not present

## 2017-03-25 DIAGNOSIS — M6281 Muscle weakness (generalized): Secondary | ICD-10-CM | POA: Diagnosis not present

## 2017-03-25 DIAGNOSIS — I2510 Atherosclerotic heart disease of native coronary artery without angina pectoris: Secondary | ICD-10-CM | POA: Diagnosis not present

## 2017-03-25 DIAGNOSIS — I872 Venous insufficiency (chronic) (peripheral): Secondary | ICD-10-CM | POA: Diagnosis not present

## 2017-03-25 DIAGNOSIS — I10 Essential (primary) hypertension: Secondary | ICD-10-CM | POA: Diagnosis not present

## 2017-03-25 DIAGNOSIS — I69398 Other sequelae of cerebral infarction: Secondary | ICD-10-CM | POA: Diagnosis not present

## 2017-03-25 DIAGNOSIS — M1991 Primary osteoarthritis, unspecified site: Secondary | ICD-10-CM | POA: Diagnosis not present

## 2017-03-26 ENCOUNTER — Other Ambulatory Visit: Payer: Self-pay

## 2017-03-26 ENCOUNTER — Other Ambulatory Visit: Payer: Self-pay | Admitting: Family Medicine

## 2017-03-26 MED ORDER — NYSTATIN 100000 UNIT/GM EX POWD: Freq: Four times a day (QID) | CUTANEOUS | 1 refills | Status: AC

## 2017-03-26 NOTE — Telephone Encounter (Signed)
Left message for pt. To call back in regard to rash , to get more information. Also to verify pharmacy.

## 2017-03-26 NOTE — Telephone Encounter (Signed)
Copied from [place] #[id number]. Topic: Quick Communication - See Telephone Encounter >> Mar 26, 2017  2:11 PM Corie Chiquito, [place] wrote: CRM for notification. See Telephone encounter for:  03/26/17. Patient needs a refill on her Nystatin. Patient rash has came back.

## 2017-03-27 DIAGNOSIS — I872 Venous insufficiency (chronic) (peripheral): Secondary | ICD-10-CM | POA: Diagnosis not present

## 2017-03-27 DIAGNOSIS — I69398 Other sequelae of cerebral infarction: Secondary | ICD-10-CM | POA: Diagnosis not present

## 2017-03-27 DIAGNOSIS — M1991 Primary osteoarthritis, unspecified site: Secondary | ICD-10-CM | POA: Diagnosis not present

## 2017-03-27 DIAGNOSIS — I10 Essential (primary) hypertension: Secondary | ICD-10-CM | POA: Diagnosis not present

## 2017-03-27 DIAGNOSIS — M6281 Muscle weakness (generalized): Secondary | ICD-10-CM | POA: Diagnosis not present

## 2017-03-27 DIAGNOSIS — I2510 Atherosclerotic heart disease of native coronary artery without angina pectoris: Secondary | ICD-10-CM | POA: Diagnosis not present

## 2017-03-30 ENCOUNTER — Ambulatory Visit: Payer: Medicare Other | Admitting: Family Medicine

## 2017-03-30 DIAGNOSIS — I872 Venous insufficiency (chronic) (peripheral): Secondary | ICD-10-CM | POA: Diagnosis not present

## 2017-03-30 DIAGNOSIS — I10 Essential (primary) hypertension: Secondary | ICD-10-CM | POA: Diagnosis not present

## 2017-03-30 DIAGNOSIS — I69398 Other sequelae of cerebral infarction: Secondary | ICD-10-CM | POA: Diagnosis not present

## 2017-03-30 DIAGNOSIS — M6281 Muscle weakness (generalized): Secondary | ICD-10-CM | POA: Diagnosis not present

## 2017-03-30 DIAGNOSIS — M1991 Primary osteoarthritis, unspecified site: Secondary | ICD-10-CM | POA: Diagnosis not present

## 2017-03-30 DIAGNOSIS — I2510 Atherosclerotic heart disease of native coronary artery without angina pectoris: Secondary | ICD-10-CM | POA: Diagnosis not present

## 2017-04-01 ENCOUNTER — Telehealth (INDEPENDENT_AMBULATORY_CARE_PROVIDER_SITE_OTHER): Payer: Self-pay

## 2017-04-01 DIAGNOSIS — M1991 Primary osteoarthritis, unspecified site: Secondary | ICD-10-CM | POA: Diagnosis not present

## 2017-04-01 DIAGNOSIS — I10 Essential (primary) hypertension: Secondary | ICD-10-CM | POA: Diagnosis not present

## 2017-04-01 DIAGNOSIS — I872 Venous insufficiency (chronic) (peripheral): Secondary | ICD-10-CM | POA: Diagnosis not present

## 2017-04-01 DIAGNOSIS — I2510 Atherosclerotic heart disease of native coronary artery without angina pectoris: Secondary | ICD-10-CM | POA: Diagnosis not present

## 2017-04-01 DIAGNOSIS — I69398 Other sequelae of cerebral infarction: Secondary | ICD-10-CM | POA: Diagnosis not present

## 2017-04-01 DIAGNOSIS — M6281 Muscle weakness (generalized): Secondary | ICD-10-CM | POA: Diagnosis not present

## 2017-04-01 NOTE — Telephone Encounter (Signed)
Patient's daughter called stating the patient has been using the compression pump and wraps on her legs and ankles. Her legs are not swollen and looking good but now the patient feels she doesn't have to use the pump and the daughter wanted to know does she need to wear the hose and use the pump. I advised that she should continue using the compression pump and wearing the wraps otherwise she will revert to how her legs were before wearing the wraps and using the compression pump. Spoke with Hezzie Bump PA and she agreed.

## 2017-04-03 ENCOUNTER — Ambulatory Visit: Payer: Medicare Other | Admitting: Family Medicine

## 2017-04-06 ENCOUNTER — Ambulatory Visit: Payer: Medicare Other | Admitting: Family Medicine

## 2017-04-07 DIAGNOSIS — I872 Venous insufficiency (chronic) (peripheral): Secondary | ICD-10-CM | POA: Diagnosis not present

## 2017-04-07 DIAGNOSIS — I2510 Atherosclerotic heart disease of native coronary artery without angina pectoris: Secondary | ICD-10-CM | POA: Diagnosis not present

## 2017-04-07 DIAGNOSIS — M1991 Primary osteoarthritis, unspecified site: Secondary | ICD-10-CM | POA: Diagnosis not present

## 2017-04-07 DIAGNOSIS — I10 Essential (primary) hypertension: Secondary | ICD-10-CM | POA: Diagnosis not present

## 2017-04-07 DIAGNOSIS — M6281 Muscle weakness (generalized): Secondary | ICD-10-CM | POA: Diagnosis not present

## 2017-04-07 DIAGNOSIS — I69398 Other sequelae of cerebral infarction: Secondary | ICD-10-CM | POA: Diagnosis not present

## 2017-04-08 DIAGNOSIS — I10 Essential (primary) hypertension: Secondary | ICD-10-CM | POA: Diagnosis not present

## 2017-04-08 DIAGNOSIS — M6281 Muscle weakness (generalized): Secondary | ICD-10-CM | POA: Diagnosis not present

## 2017-04-08 DIAGNOSIS — I69398 Other sequelae of cerebral infarction: Secondary | ICD-10-CM | POA: Diagnosis not present

## 2017-04-08 DIAGNOSIS — I2510 Atherosclerotic heart disease of native coronary artery without angina pectoris: Secondary | ICD-10-CM | POA: Diagnosis not present

## 2017-04-08 DIAGNOSIS — M1991 Primary osteoarthritis, unspecified site: Secondary | ICD-10-CM | POA: Diagnosis not present

## 2017-04-08 DIAGNOSIS — I872 Venous insufficiency (chronic) (peripheral): Secondary | ICD-10-CM | POA: Diagnosis not present

## 2017-04-09 DIAGNOSIS — M1991 Primary osteoarthritis, unspecified site: Secondary | ICD-10-CM | POA: Diagnosis not present

## 2017-04-09 DIAGNOSIS — I872 Venous insufficiency (chronic) (peripheral): Secondary | ICD-10-CM | POA: Diagnosis not present

## 2017-04-09 DIAGNOSIS — M79674 Pain in right toe(s): Secondary | ICD-10-CM | POA: Diagnosis not present

## 2017-04-09 DIAGNOSIS — Z9181 History of falling: Secondary | ICD-10-CM | POA: Diagnosis not present

## 2017-04-09 DIAGNOSIS — B351 Tinea unguium: Secondary | ICD-10-CM | POA: Diagnosis not present

## 2017-04-09 DIAGNOSIS — M6281 Muscle weakness (generalized): Secondary | ICD-10-CM | POA: Diagnosis not present

## 2017-04-09 DIAGNOSIS — I69398 Other sequelae of cerebral infarction: Secondary | ICD-10-CM | POA: Diagnosis not present

## 2017-04-09 DIAGNOSIS — M4800 Spinal stenosis, site unspecified: Secondary | ICD-10-CM | POA: Diagnosis not present

## 2017-04-09 DIAGNOSIS — I10 Essential (primary) hypertension: Secondary | ICD-10-CM | POA: Diagnosis not present

## 2017-04-09 DIAGNOSIS — I2510 Atherosclerotic heart disease of native coronary artery without angina pectoris: Secondary | ICD-10-CM | POA: Diagnosis not present

## 2017-04-09 DIAGNOSIS — M79675 Pain in left toe(s): Secondary | ICD-10-CM | POA: Diagnosis not present

## 2017-04-10 DIAGNOSIS — I10 Essential (primary) hypertension: Secondary | ICD-10-CM | POA: Diagnosis not present

## 2017-04-10 DIAGNOSIS — I2510 Atherosclerotic heart disease of native coronary artery without angina pectoris: Secondary | ICD-10-CM | POA: Diagnosis not present

## 2017-04-10 DIAGNOSIS — I69398 Other sequelae of cerebral infarction: Secondary | ICD-10-CM | POA: Diagnosis not present

## 2017-04-10 DIAGNOSIS — M1991 Primary osteoarthritis, unspecified site: Secondary | ICD-10-CM | POA: Diagnosis not present

## 2017-04-10 DIAGNOSIS — I872 Venous insufficiency (chronic) (peripheral): Secondary | ICD-10-CM | POA: Diagnosis not present

## 2017-04-10 DIAGNOSIS — M6281 Muscle weakness (generalized): Secondary | ICD-10-CM | POA: Diagnosis not present

## 2017-04-14 DIAGNOSIS — I10 Essential (primary) hypertension: Secondary | ICD-10-CM | POA: Diagnosis not present

## 2017-04-14 DIAGNOSIS — I872 Venous insufficiency (chronic) (peripheral): Secondary | ICD-10-CM | POA: Diagnosis not present

## 2017-04-14 DIAGNOSIS — I2510 Atherosclerotic heart disease of native coronary artery without angina pectoris: Secondary | ICD-10-CM | POA: Diagnosis not present

## 2017-04-14 DIAGNOSIS — I69398 Other sequelae of cerebral infarction: Secondary | ICD-10-CM | POA: Diagnosis not present

## 2017-04-14 DIAGNOSIS — M6281 Muscle weakness (generalized): Secondary | ICD-10-CM | POA: Diagnosis not present

## 2017-04-14 DIAGNOSIS — M1991 Primary osteoarthritis, unspecified site: Secondary | ICD-10-CM | POA: Diagnosis not present

## 2017-04-15 DIAGNOSIS — I69398 Other sequelae of cerebral infarction: Secondary | ICD-10-CM | POA: Diagnosis not present

## 2017-04-15 DIAGNOSIS — I2510 Atherosclerotic heart disease of native coronary artery without angina pectoris: Secondary | ICD-10-CM | POA: Diagnosis not present

## 2017-04-15 DIAGNOSIS — M1991 Primary osteoarthritis, unspecified site: Secondary | ICD-10-CM | POA: Diagnosis not present

## 2017-04-15 DIAGNOSIS — I872 Venous insufficiency (chronic) (peripheral): Secondary | ICD-10-CM | POA: Diagnosis not present

## 2017-04-15 DIAGNOSIS — M6281 Muscle weakness (generalized): Secondary | ICD-10-CM | POA: Diagnosis not present

## 2017-04-15 DIAGNOSIS — I10 Essential (primary) hypertension: Secondary | ICD-10-CM | POA: Diagnosis not present

## 2017-04-17 DIAGNOSIS — I69398 Other sequelae of cerebral infarction: Secondary | ICD-10-CM | POA: Diagnosis not present

## 2017-04-17 DIAGNOSIS — M6281 Muscle weakness (generalized): Secondary | ICD-10-CM | POA: Diagnosis not present

## 2017-04-17 DIAGNOSIS — I2510 Atherosclerotic heart disease of native coronary artery without angina pectoris: Secondary | ICD-10-CM | POA: Diagnosis not present

## 2017-04-17 DIAGNOSIS — I10 Essential (primary) hypertension: Secondary | ICD-10-CM | POA: Diagnosis not present

## 2017-04-17 DIAGNOSIS — M1991 Primary osteoarthritis, unspecified site: Secondary | ICD-10-CM | POA: Diagnosis not present

## 2017-04-17 DIAGNOSIS — I872 Venous insufficiency (chronic) (peripheral): Secondary | ICD-10-CM | POA: Diagnosis not present

## 2017-04-20 DIAGNOSIS — M19012 Primary osteoarthritis, left shoulder: Secondary | ICD-10-CM | POA: Diagnosis not present

## 2017-04-20 DIAGNOSIS — M19011 Primary osteoarthritis, right shoulder: Secondary | ICD-10-CM | POA: Diagnosis not present

## 2017-04-22 DIAGNOSIS — I872 Venous insufficiency (chronic) (peripheral): Secondary | ICD-10-CM | POA: Diagnosis not present

## 2017-04-22 DIAGNOSIS — M6281 Muscle weakness (generalized): Secondary | ICD-10-CM | POA: Diagnosis not present

## 2017-04-22 DIAGNOSIS — I69398 Other sequelae of cerebral infarction: Secondary | ICD-10-CM | POA: Diagnosis not present

## 2017-04-22 DIAGNOSIS — I2510 Atherosclerotic heart disease of native coronary artery without angina pectoris: Secondary | ICD-10-CM | POA: Diagnosis not present

## 2017-04-22 DIAGNOSIS — M1991 Primary osteoarthritis, unspecified site: Secondary | ICD-10-CM | POA: Diagnosis not present

## 2017-04-22 DIAGNOSIS — I10 Essential (primary) hypertension: Secondary | ICD-10-CM | POA: Diagnosis not present

## 2017-04-23 DIAGNOSIS — I2510 Atherosclerotic heart disease of native coronary artery without angina pectoris: Secondary | ICD-10-CM | POA: Diagnosis not present

## 2017-04-23 DIAGNOSIS — M6281 Muscle weakness (generalized): Secondary | ICD-10-CM | POA: Diagnosis not present

## 2017-04-23 DIAGNOSIS — M1991 Primary osteoarthritis, unspecified site: Secondary | ICD-10-CM | POA: Diagnosis not present

## 2017-04-23 DIAGNOSIS — I872 Venous insufficiency (chronic) (peripheral): Secondary | ICD-10-CM | POA: Diagnosis not present

## 2017-04-23 DIAGNOSIS — I69398 Other sequelae of cerebral infarction: Secondary | ICD-10-CM | POA: Diagnosis not present

## 2017-04-23 DIAGNOSIS — I10 Essential (primary) hypertension: Secondary | ICD-10-CM | POA: Diagnosis not present

## 2017-04-24 DIAGNOSIS — M6281 Muscle weakness (generalized): Secondary | ICD-10-CM | POA: Diagnosis not present

## 2017-04-24 DIAGNOSIS — I10 Essential (primary) hypertension: Secondary | ICD-10-CM | POA: Diagnosis not present

## 2017-04-24 DIAGNOSIS — I2510 Atherosclerotic heart disease of native coronary artery without angina pectoris: Secondary | ICD-10-CM | POA: Diagnosis not present

## 2017-04-24 DIAGNOSIS — M1991 Primary osteoarthritis, unspecified site: Secondary | ICD-10-CM | POA: Diagnosis not present

## 2017-04-24 DIAGNOSIS — I872 Venous insufficiency (chronic) (peripheral): Secondary | ICD-10-CM | POA: Diagnosis not present

## 2017-04-24 DIAGNOSIS — I69398 Other sequelae of cerebral infarction: Secondary | ICD-10-CM | POA: Diagnosis not present

## 2017-04-29 DIAGNOSIS — I10 Essential (primary) hypertension: Secondary | ICD-10-CM | POA: Diagnosis not present

## 2017-04-29 DIAGNOSIS — I69398 Other sequelae of cerebral infarction: Secondary | ICD-10-CM | POA: Diagnosis not present

## 2017-04-29 DIAGNOSIS — I2510 Atherosclerotic heart disease of native coronary artery without angina pectoris: Secondary | ICD-10-CM | POA: Diagnosis not present

## 2017-04-29 DIAGNOSIS — M6281 Muscle weakness (generalized): Secondary | ICD-10-CM | POA: Diagnosis not present

## 2017-04-29 DIAGNOSIS — M1991 Primary osteoarthritis, unspecified site: Secondary | ICD-10-CM | POA: Diagnosis not present

## 2017-04-29 DIAGNOSIS — I872 Venous insufficiency (chronic) (peripheral): Secondary | ICD-10-CM | POA: Diagnosis not present

## 2017-04-30 DIAGNOSIS — M6281 Muscle weakness (generalized): Secondary | ICD-10-CM | POA: Diagnosis not present

## 2017-04-30 DIAGNOSIS — I872 Venous insufficiency (chronic) (peripheral): Secondary | ICD-10-CM | POA: Diagnosis not present

## 2017-04-30 DIAGNOSIS — I2510 Atherosclerotic heart disease of native coronary artery without angina pectoris: Secondary | ICD-10-CM | POA: Diagnosis not present

## 2017-04-30 DIAGNOSIS — I69398 Other sequelae of cerebral infarction: Secondary | ICD-10-CM | POA: Diagnosis not present

## 2017-04-30 DIAGNOSIS — M1991 Primary osteoarthritis, unspecified site: Secondary | ICD-10-CM | POA: Diagnosis not present

## 2017-04-30 DIAGNOSIS — I10 Essential (primary) hypertension: Secondary | ICD-10-CM | POA: Diagnosis not present

## 2017-05-01 DIAGNOSIS — M1991 Primary osteoarthritis, unspecified site: Secondary | ICD-10-CM | POA: Diagnosis not present

## 2017-05-01 DIAGNOSIS — I69398 Other sequelae of cerebral infarction: Secondary | ICD-10-CM | POA: Diagnosis not present

## 2017-05-01 DIAGNOSIS — I10 Essential (primary) hypertension: Secondary | ICD-10-CM | POA: Diagnosis not present

## 2017-05-01 DIAGNOSIS — I872 Venous insufficiency (chronic) (peripheral): Secondary | ICD-10-CM | POA: Diagnosis not present

## 2017-05-01 DIAGNOSIS — M6281 Muscle weakness (generalized): Secondary | ICD-10-CM | POA: Diagnosis not present

## 2017-05-01 DIAGNOSIS — I2510 Atherosclerotic heart disease of native coronary artery without angina pectoris: Secondary | ICD-10-CM | POA: Diagnosis not present

## 2017-05-06 ENCOUNTER — Ambulatory Visit: Payer: Medicare Other | Admitting: Family Medicine

## 2017-05-06 DIAGNOSIS — M1991 Primary osteoarthritis, unspecified site: Secondary | ICD-10-CM | POA: Diagnosis not present

## 2017-05-06 DIAGNOSIS — I2510 Atherosclerotic heart disease of native coronary artery without angina pectoris: Secondary | ICD-10-CM | POA: Diagnosis not present

## 2017-05-06 DIAGNOSIS — I872 Venous insufficiency (chronic) (peripheral): Secondary | ICD-10-CM | POA: Diagnosis not present

## 2017-05-06 DIAGNOSIS — I10 Essential (primary) hypertension: Secondary | ICD-10-CM | POA: Diagnosis not present

## 2017-05-06 DIAGNOSIS — M6281 Muscle weakness (generalized): Secondary | ICD-10-CM | POA: Diagnosis not present

## 2017-05-06 DIAGNOSIS — I69398 Other sequelae of cerebral infarction: Secondary | ICD-10-CM | POA: Diagnosis not present

## 2017-05-12 ENCOUNTER — Telehealth: Payer: Self-pay | Admitting: Family Medicine

## 2017-05-12 DIAGNOSIS — M6281 Muscle weakness (generalized): Secondary | ICD-10-CM | POA: Diagnosis not present

## 2017-05-12 DIAGNOSIS — M1991 Primary osteoarthritis, unspecified site: Secondary | ICD-10-CM | POA: Diagnosis not present

## 2017-05-12 DIAGNOSIS — I872 Venous insufficiency (chronic) (peripheral): Secondary | ICD-10-CM | POA: Diagnosis not present

## 2017-05-12 DIAGNOSIS — I2510 Atherosclerotic heart disease of native coronary artery without angina pectoris: Secondary | ICD-10-CM | POA: Diagnosis not present

## 2017-05-12 DIAGNOSIS — I69398 Other sequelae of cerebral infarction: Secondary | ICD-10-CM | POA: Diagnosis not present

## 2017-05-12 DIAGNOSIS — I10 Essential (primary) hypertension: Secondary | ICD-10-CM | POA: Diagnosis not present

## 2017-05-12 NOTE — Telephone Encounter (Signed)
OK 

## 2017-05-12 NOTE — Telephone Encounter (Signed)
Routing to provider for verbal OK for orders.

## 2017-05-12 NOTE — Telephone Encounter (Signed)
Copied from [place] [id number]. Topic: Quick Communication - See Telephone Encounter >> May 12, 2017  2:18 PM Percell Belt A wrote: CRM for notification. See Telephone encounter for:  [facility] [phone] Need verbals for a PT to eval for OT needs     05/12/17.

## 2017-05-13 DIAGNOSIS — I10 Essential (primary) hypertension: Secondary | ICD-10-CM | POA: Diagnosis not present

## 2017-05-13 DIAGNOSIS — I69398 Other sequelae of cerebral infarction: Secondary | ICD-10-CM | POA: Diagnosis not present

## 2017-05-13 DIAGNOSIS — I872 Venous insufficiency (chronic) (peripheral): Secondary | ICD-10-CM | POA: Diagnosis not present

## 2017-05-13 DIAGNOSIS — M6281 Muscle weakness (generalized): Secondary | ICD-10-CM | POA: Diagnosis not present

## 2017-05-13 DIAGNOSIS — M1991 Primary osteoarthritis, unspecified site: Secondary | ICD-10-CM | POA: Diagnosis not present

## 2017-05-13 DIAGNOSIS — I2510 Atherosclerotic heart disease of native coronary artery without angina pectoris: Secondary | ICD-10-CM | POA: Diagnosis not present

## 2017-05-13 NOTE — Telephone Encounter (Signed)
Message relayed to patient. Verbalized understanding and denied questions.   

## 2017-05-21 DIAGNOSIS — I872 Venous insufficiency (chronic) (peripheral): Secondary | ICD-10-CM | POA: Diagnosis not present

## 2017-05-21 DIAGNOSIS — I2510 Atherosclerotic heart disease of native coronary artery without angina pectoris: Secondary | ICD-10-CM | POA: Diagnosis not present

## 2017-05-21 DIAGNOSIS — M1991 Primary osteoarthritis, unspecified site: Secondary | ICD-10-CM | POA: Diagnosis not present

## 2017-05-21 DIAGNOSIS — M6281 Muscle weakness (generalized): Secondary | ICD-10-CM | POA: Diagnosis not present

## 2017-05-21 DIAGNOSIS — I10 Essential (primary) hypertension: Secondary | ICD-10-CM | POA: Diagnosis not present

## 2017-05-21 DIAGNOSIS — I69398 Other sequelae of cerebral infarction: Secondary | ICD-10-CM | POA: Diagnosis not present

## 2017-05-22 DIAGNOSIS — M6281 Muscle weakness (generalized): Secondary | ICD-10-CM | POA: Diagnosis not present

## 2017-05-22 DIAGNOSIS — I2510 Atherosclerotic heart disease of native coronary artery without angina pectoris: Secondary | ICD-10-CM | POA: Diagnosis not present

## 2017-05-22 DIAGNOSIS — I872 Venous insufficiency (chronic) (peripheral): Secondary | ICD-10-CM | POA: Diagnosis not present

## 2017-05-22 DIAGNOSIS — M1991 Primary osteoarthritis, unspecified site: Secondary | ICD-10-CM | POA: Diagnosis not present

## 2017-05-22 DIAGNOSIS — I10 Essential (primary) hypertension: Secondary | ICD-10-CM | POA: Diagnosis not present

## 2017-05-22 DIAGNOSIS — I69398 Other sequelae of cerebral infarction: Secondary | ICD-10-CM | POA: Diagnosis not present

## 2017-05-27 DIAGNOSIS — M6281 Muscle weakness (generalized): Secondary | ICD-10-CM | POA: Diagnosis not present

## 2017-05-27 DIAGNOSIS — I69398 Other sequelae of cerebral infarction: Secondary | ICD-10-CM | POA: Diagnosis not present

## 2017-05-27 DIAGNOSIS — M1991 Primary osteoarthritis, unspecified site: Secondary | ICD-10-CM | POA: Diagnosis not present

## 2017-05-27 DIAGNOSIS — I872 Venous insufficiency (chronic) (peripheral): Secondary | ICD-10-CM | POA: Diagnosis not present

## 2017-05-27 DIAGNOSIS — I10 Essential (primary) hypertension: Secondary | ICD-10-CM | POA: Diagnosis not present

## 2017-05-27 DIAGNOSIS — I2510 Atherosclerotic heart disease of native coronary artery without angina pectoris: Secondary | ICD-10-CM | POA: Diagnosis not present

## 2017-05-29 ENCOUNTER — Telehealth: Payer: Self-pay | Admitting: Family Medicine

## 2017-05-29 DIAGNOSIS — I2510 Atherosclerotic heart disease of native coronary artery without angina pectoris: Secondary | ICD-10-CM | POA: Diagnosis not present

## 2017-05-29 DIAGNOSIS — I872 Venous insufficiency (chronic) (peripheral): Secondary | ICD-10-CM | POA: Diagnosis not present

## 2017-05-29 DIAGNOSIS — M6281 Muscle weakness (generalized): Secondary | ICD-10-CM | POA: Diagnosis not present

## 2017-05-29 DIAGNOSIS — I69398 Other sequelae of cerebral infarction: Secondary | ICD-10-CM | POA: Diagnosis not present

## 2017-05-29 DIAGNOSIS — M1991 Primary osteoarthritis, unspecified site: Secondary | ICD-10-CM | POA: Diagnosis not present

## 2017-05-29 DIAGNOSIS — I10 Essential (primary) hypertension: Secondary | ICD-10-CM | POA: Diagnosis not present

## 2017-05-29 NOTE — Telephone Encounter (Signed)
Pt's daughter asking if OK for pt.to take Imodium.  Reports ONE episode of loose stool this AM. No other symptoms. Instructed to take Imodium as directed on packaging only if another episode occurs. Advised to call back if symptoms worsen, to keep pt hydrated ; maintain diet of rice, bananas, applesauce, clear soups and diluted Gatorade if  loose stools reoccur.

## 2017-06-01 DIAGNOSIS — M1991 Primary osteoarthritis, unspecified site: Secondary | ICD-10-CM | POA: Diagnosis not present

## 2017-06-01 DIAGNOSIS — I10 Essential (primary) hypertension: Secondary | ICD-10-CM | POA: Diagnosis not present

## 2017-06-01 DIAGNOSIS — I69398 Other sequelae of cerebral infarction: Secondary | ICD-10-CM | POA: Diagnosis not present

## 2017-06-01 DIAGNOSIS — I2510 Atherosclerotic heart disease of native coronary artery without angina pectoris: Secondary | ICD-10-CM | POA: Diagnosis not present

## 2017-06-01 DIAGNOSIS — M6281 Muscle weakness (generalized): Secondary | ICD-10-CM | POA: Diagnosis not present

## 2017-06-01 DIAGNOSIS — I872 Venous insufficiency (chronic) (peripheral): Secondary | ICD-10-CM | POA: Diagnosis not present

## 2017-06-02 DIAGNOSIS — I872 Venous insufficiency (chronic) (peripheral): Secondary | ICD-10-CM | POA: Diagnosis not present

## 2017-06-02 DIAGNOSIS — M6281 Muscle weakness (generalized): Secondary | ICD-10-CM | POA: Diagnosis not present

## 2017-06-02 DIAGNOSIS — I10 Essential (primary) hypertension: Secondary | ICD-10-CM | POA: Diagnosis not present

## 2017-06-02 DIAGNOSIS — I2510 Atherosclerotic heart disease of native coronary artery without angina pectoris: Secondary | ICD-10-CM | POA: Diagnosis not present

## 2017-06-02 DIAGNOSIS — M1991 Primary osteoarthritis, unspecified site: Secondary | ICD-10-CM | POA: Diagnosis not present

## 2017-06-02 DIAGNOSIS — I69398 Other sequelae of cerebral infarction: Secondary | ICD-10-CM | POA: Diagnosis not present

## 2017-06-04 DIAGNOSIS — M6281 Muscle weakness (generalized): Secondary | ICD-10-CM | POA: Diagnosis not present

## 2017-06-04 DIAGNOSIS — M1991 Primary osteoarthritis, unspecified site: Secondary | ICD-10-CM | POA: Diagnosis not present

## 2017-06-04 DIAGNOSIS — I872 Venous insufficiency (chronic) (peripheral): Secondary | ICD-10-CM | POA: Diagnosis not present

## 2017-06-04 DIAGNOSIS — I10 Essential (primary) hypertension: Secondary | ICD-10-CM | POA: Diagnosis not present

## 2017-06-04 DIAGNOSIS — I2510 Atherosclerotic heart disease of native coronary artery without angina pectoris: Secondary | ICD-10-CM | POA: Diagnosis not present

## 2017-06-04 DIAGNOSIS — I69398 Other sequelae of cerebral infarction: Secondary | ICD-10-CM | POA: Diagnosis not present

## 2017-06-05 DIAGNOSIS — I69398 Other sequelae of cerebral infarction: Secondary | ICD-10-CM | POA: Diagnosis not present

## 2017-06-05 DIAGNOSIS — M6281 Muscle weakness (generalized): Secondary | ICD-10-CM | POA: Diagnosis not present

## 2017-06-05 DIAGNOSIS — I872 Venous insufficiency (chronic) (peripheral): Secondary | ICD-10-CM | POA: Diagnosis not present

## 2017-06-05 DIAGNOSIS — I2510 Atherosclerotic heart disease of native coronary artery without angina pectoris: Secondary | ICD-10-CM | POA: Diagnosis not present

## 2017-06-05 DIAGNOSIS — M1991 Primary osteoarthritis, unspecified site: Secondary | ICD-10-CM | POA: Diagnosis not present

## 2017-06-05 DIAGNOSIS — I10 Essential (primary) hypertension: Secondary | ICD-10-CM | POA: Diagnosis not present

## 2017-06-08 ENCOUNTER — Ambulatory Visit (INDEPENDENT_AMBULATORY_CARE_PROVIDER_SITE_OTHER): Payer: Medicare Other | Admitting: Family Medicine

## 2017-06-08 ENCOUNTER — Encounter: Payer: Self-pay | Admitting: Family Medicine

## 2017-06-08 VITALS — BP 128/87 | HR 68

## 2017-06-08 DIAGNOSIS — Z1329 Encounter for screening for other suspected endocrine disorder: Secondary | ICD-10-CM

## 2017-06-08 DIAGNOSIS — I10 Essential (primary) hypertension: Secondary | ICD-10-CM | POA: Diagnosis not present

## 2017-06-08 DIAGNOSIS — I872 Venous insufficiency (chronic) (peripheral): Secondary | ICD-10-CM | POA: Diagnosis not present

## 2017-06-08 DIAGNOSIS — Z9181 History of falling: Secondary | ICD-10-CM | POA: Diagnosis not present

## 2017-06-08 DIAGNOSIS — D692 Other nonthrombocytopenic purpura: Secondary | ICD-10-CM

## 2017-06-08 DIAGNOSIS — M19011 Primary osteoarthritis, right shoulder: Secondary | ICD-10-CM | POA: Diagnosis not present

## 2017-06-08 DIAGNOSIS — I69398 Other sequelae of cerebral infarction: Secondary | ICD-10-CM | POA: Diagnosis not present

## 2017-06-08 DIAGNOSIS — I6523 Occlusion and stenosis of bilateral carotid arteries: Secondary | ICD-10-CM | POA: Diagnosis not present

## 2017-06-08 DIAGNOSIS — M19012 Primary osteoarthritis, left shoulder: Secondary | ICD-10-CM | POA: Diagnosis not present

## 2017-06-08 DIAGNOSIS — M6281 Muscle weakness (generalized): Secondary | ICD-10-CM | POA: Diagnosis not present

## 2017-06-08 DIAGNOSIS — Z23 Encounter for immunization: Secondary | ICD-10-CM

## 2017-06-08 DIAGNOSIS — M4800 Spinal stenosis, site unspecified: Secondary | ICD-10-CM | POA: Diagnosis not present

## 2017-06-08 DIAGNOSIS — I2510 Atherosclerotic heart disease of native coronary artery without angina pectoris: Secondary | ICD-10-CM | POA: Diagnosis not present

## 2017-06-08 DIAGNOSIS — M1991 Primary osteoarthritis, unspecified site: Secondary | ICD-10-CM | POA: Diagnosis not present

## 2017-06-08 DIAGNOSIS — Z7189 Other specified counseling: Secondary | ICD-10-CM | POA: Diagnosis not present

## 2017-06-08 LAB — LP+ALT+AST PICCOLO, WAIVED
ALT (SGPT) PICCOLO, WAIVED: 24 U/L (ref 10–47)
AST (SGOT) PICCOLO, WAIVED: 31 U/L (ref 11–38)
CHOL/HDL RATIO PICCOLO,WAIVE: 3.9 mg/dL
Cholesterol Piccolo, Waived: 179 mg/dL (ref <200)
HDL Chol Piccolo, Waived: 46 mg/dL — ABNORMAL LOW (ref >59)
LDL Chol Calc Piccolo Waived: 95 mg/dL (ref <100)
Triglycerides Piccolo,Waived: 192 mg/dL — ABNORMAL HIGH (ref <150)
VLDL CHOL CALC PICCOLO,WAIVE: 38 mg/dL — AB (ref <30)

## 2017-06-08 NOTE — Progress Notes (Signed)
BP 128/87   Pulse 68   SpO2 99%    Subjective:    Patient ID: Jennifer Murray, female    DOB: 08/27/1927, 84 y.o.   MRN: [id number]  HPI: Jennifer Murray is a 84 y.o. female  Chief Complaint  Patient presents with  . Follow-up  . Shoulder Pain    Chronic   Patient accompanied by her daughter and daughter-in-law who assists with history. Patient's biggest complaint is her shoulder pain. Reviewed orthopedics notes and treatment recommendations Patient also with multiple othercomplaints from overall age and failing. Reviewedbathingactivities today DL eating  Limited exercise falling risk Reviewed medication risk and benefits Relevant past medical, surgical, family and social history reviewed and updated as indicated. Interim medical history since our last visit reviewed. Allergies and medications reviewed and updated.  Review of Systems  Constitutional: Negative.   Respiratory: Negative.   Cardiovascular: Negative.     Per HPI unless specifically indicated above     Objective:    BP 128/87   Pulse 68   SpO2 99%   Wt Readings from Last 3 Encounters:  12/29/16 170 lb (77.1 kg)  12/18/16 238 lb (108 kg)  09/30/16 216 lb 8 oz (98.2 kg)    Physical Exam  Constitutional: She is oriented to person, place, and time. She appears well-developed and well-nourished.  HENT:  Head: Normocephalic and atraumatic.  Eyes: Conjunctivae and EOM are normal.  Neck: Normal range of motion.  Cardiovascular: Normal rate, regular rhythm and normal heart sounds.  Pulmonary/Chest: Effort normal and breath sounds normal.  Musculoskeletal: Normal range of motion.  Neurological: She is alert and oriented to person, place, and time.  Skin: No erythema.  Psychiatric: She has a normal mood and affect. Her behavior is normal. Judgment and thought content normal.    Results for orders placed or performed during the hospital encounter of 12/24/16  Blood culture (routine x 2)  Result Value Ref Range   Specimen Description BLOOD LEFT ANTECUBITAL    Special Requests      BOTTLES DRAWN AEROBIC AND ANAEROBIC Blood Culture results may not be optimal due to an excessive volume of blood received in culture bottles   Culture  Setup Time      GRAM NEGATIVE RODS AEROBIC BOTTLE ONLY CRITICAL RESULT CALLED TO, READ BACK BY AND VERIFIED WITH: NATE COOKSON AT [place] ON 12/26/2016 JJB    Culture KLEBSIELLA PNEUMONIAE (A)    Report Status 12/28/2016 FINAL    Organism ID, Bacteria KLEBSIELLA PNEUMONIAE       Susceptibility   Klebsiella pneumoniae - MIC*    AMPICILLIN >=32 RESISTANT Resistant     CEFAZOLIN <=4 SENSITIVE Sensitive     CEFEPIME <=1 SENSITIVE Sensitive     CEFTAZIDIME <=1 SENSITIVE Sensitive     CEFTRIAXONE <=1 SENSITIVE Sensitive     CIPROFLOXACIN <=0.25 SENSITIVE Sensitive     GENTAMICIN <=1 SENSITIVE Sensitive     IMIPENEM <=0.25 SENSITIVE Sensitive     TRIMETH/SULFA <=20 SENSITIVE Sensitive     AMPICILLIN/SULBACTAM 4 SENSITIVE Sensitive     PIP/TAZO <=4 SENSITIVE Sensitive     Extended ESBL NEGATIVE Sensitive     * KLEBSIELLA PNEUMONIAE  Blood culture (routine x 2)  Result Value Ref Range   Specimen Description BLOOD RIGHT WRIST    Special Requests      BOTTLES DRAWN AEROBIC AND ANAEROBIC Blood Culture adequate volume   Culture NO GROWTH 5 DAYS    Report Status 12/29/2016 FINAL   Urine  culture  Result Value Ref Range   Specimen Description URINE, RANDOM    Special Requests NONE    Culture >=100,000 COLONIES/mL KLEBSIELLA PNEUMONIAE (A)    Report Status 12/27/2016 FINAL    Organism ID, Bacteria KLEBSIELLA PNEUMONIAE (A)       Susceptibility   Klebsiella pneumoniae - MIC*    AMPICILLIN >=32 RESISTANT Resistant     CEFAZOLIN <=4 SENSITIVE Sensitive     CEFTRIAXONE <=1 SENSITIVE Sensitive     CIPROFLOXACIN <=0.25 SENSITIVE Sensitive     GENTAMICIN <=1 SENSITIVE Sensitive     IMIPENEM <=0.25 SENSITIVE Sensitive     NITROFURANTOIN 64 INTERMEDIATE Intermediate      TRIMETH/SULFA <=20 SENSITIVE Sensitive     AMPICILLIN/SULBACTAM 4 SENSITIVE Sensitive     PIP/TAZO <=4 SENSITIVE Sensitive     Extended ESBL NEGATIVE Sensitive     * >=100,000 COLONIES/mL KLEBSIELLA PNEUMONIAE  Blood Culture ID Panel (Reflexed)  Result Value Ref Range   Enterococcus species NOT DETECTED NOT DETECTED   Listeria monocytogenes NOT DETECTED NOT DETECTED   Staphylococcus species NOT DETECTED NOT DETECTED   Staphylococcus aureus NOT DETECTED NOT DETECTED   Streptococcus species NOT DETECTED NOT DETECTED   Streptococcus agalactiae NOT DETECTED NOT DETECTED   Streptococcus pneumoniae NOT DETECTED NOT DETECTED   Streptococcus pyogenes NOT DETECTED NOT DETECTED   Acinetobacter baumannii NOT DETECTED NOT DETECTED   Enterobacteriaceae species DETECTED (A) NOT DETECTED   Enterobacter cloacae complex NOT DETECTED NOT DETECTED   Escherichia coli NOT DETECTED NOT DETECTED   Klebsiella oxytoca NOT DETECTED NOT DETECTED   Klebsiella pneumoniae DETECTED (A) NOT DETECTED   Proteus species NOT DETECTED NOT DETECTED   Serratia marcescens NOT DETECTED NOT DETECTED   Carbapenem resistance NOT DETECTED NOT DETECTED   Haemophilus influenzae NOT DETECTED NOT DETECTED   Neisseria meningitidis NOT DETECTED NOT DETECTED   Pseudomonas aeruginosa NOT DETECTED NOT DETECTED   Candida albicans NOT DETECTED NOT DETECTED   Candida glabrata NOT DETECTED NOT DETECTED   Candida krusei NOT DETECTED NOT DETECTED   Candida parapsilosis NOT DETECTED NOT DETECTED   Candida tropicalis NOT DETECTED NOT DETECTED  CBC with Differential  Result Value Ref Range   WBC 10.5 3.6 - 11.0 K/uL   RBC 4.49 3.80 - 5.20 MIL/uL   Hemoglobin 13.9 12.0 - 16.0 g/dL   HCT 40.1 35.0 - 47.0 %   MCV 89.2 80.0 - 100.0 fL   MCH 30.9 26.0 - 34.0 pg   MCHC 34.7 32.0 - 36.0 g/dL   RDW 12.8 11.5 - 14.5 %   Platelets 157 150 - 440 K/uL   Neutrophils Relative % 85 %   Neutro Abs 8.9 (H) 1.4 - 6.5 K/uL   Lymphocytes Relative 8 %    Lymphs Abs 0.8 (L) 1.0 - 3.6 K/uL   Monocytes Relative 7 %   Monocytes Absolute 0.7 0.2 - 0.9 K/uL   Eosinophils Relative 0 %   Eosinophils Absolute 0.0 0 - 0.7 K/uL   Basophils Relative 0 %   Basophils Absolute 0.0 0 - 0.1 K/uL  Comprehensive metabolic panel  Result Value Ref Range   Sodium 136 135 - 145 mmol/L   Potassium 3.5 3.5 - 5.1 mmol/L   Chloride 106 101 - 111 mmol/L   CO2 23 22 - 32 mmol/L   Glucose, Bld 153 (H) 65 - 99 mg/dL   BUN 19 6 - 20 mg/dL   Creatinine, Ser 0.68 0.44 - 1.00 mg/dL   Calcium 9.3 8.9 - 10.3  mg/dL   Total Protein 6.2 (L) 6.5 - 8.1 g/dL   Albumin 3.4 (L) 3.5 - 5.0 g/dL   AST 159 (H) 15 - 41 U/L   ALT 49 14 - 54 U/L   Alkaline Phosphatase 80 38 - 126 U/L   Total Bilirubin 1.4 (H) 0.3 - 1.2 mg/dL   GFR calc non Af Amer >60 >60 mL/min   GFR calc Af Amer >60 >60 mL/min   Anion gap 7 5 - 15  Troponin I  Result Value Ref Range   Troponin I 0.03 (HH) <0.03 ng/mL  Urinalysis, Complete w Microscopic  Result Value Ref Range   Color, Urine AMBER (A) YELLOW   APPearance CLOUDY (A) CLEAR   Specific Gravity, Urine 1.020 1.005 - 1.030   pH 5.0 5.0 - 8.0   Glucose, UA NEGATIVE NEGATIVE mg/dL   Hgb urine dipstick LARGE (A) NEGATIVE   Bilirubin Urine NEGATIVE NEGATIVE   Ketones, ur 5 (A) NEGATIVE mg/dL   Protein, ur 30 (A) NEGATIVE mg/dL   Nitrite POSITIVE (A) NEGATIVE   Leukocytes, UA MODERATE (A) NEGATIVE   RBC / HPF TOO NUMEROUS TO COUNT 0 - 5 RBC/hpf   WBC, UA TOO NUMEROUS TO COUNT 0 - 5 WBC/hpf   Bacteria, UA MANY (A) NONE SEEN   Squamous Epithelial / LPF NONE SEEN NONE SEEN   WBC Clumps PRESENT    Mucus PRESENT   CK  Result Value Ref Range   Total CK 3,580 (H) 38 - 234 U/L  Blood gas, venous  Result Value Ref Range   pH, Ven 7.49 (H) 7.250 - 7.430   pCO2, Ven 35 (L) 44.0 - 60.0 mmHg   pO2, Ven 42.0 32.0 - 45.0 mmHg   Bicarbonate 26.7 20.0 - 28.0 mmol/L   Acid-Base Excess 3.5 (H) 0.0 - 2.0 mmol/L   O2 Saturation 82.0 %   Patient temperature  37.0    Collection site VENOUS    Sample type VENOUS   Lactic acid, plasma  Result Value Ref Range   Lactic Acid, Venous 1.1 0.5 - 1.9 mmol/L  Lactic acid, plasma  Result Value Ref Range   Lactic Acid, Venous 1.2 0.5 - 1.9 mmol/L  CK  Result Value Ref Range   Total CK 3,223 (H) 38 - 234 U/L  CK  Result Value Ref Range   Total CK 1,533 (H) 38 - 234 U/L  Basic metabolic panel  Result Value Ref Range   Sodium 136 135 - 145 mmol/L   Potassium 3.5 3.5 - 5.1 mmol/L   Chloride 106 101 - 111 mmol/L   CO2 25 22 - 32 mmol/L   Glucose, Bld 138 (H) 65 - 99 mg/dL   BUN 16 6 - 20 mg/dL   Creatinine, Ser 0.90 0.44 - 1.00 mg/dL   Calcium 8.7 (L) 8.9 - 10.3 mg/dL   GFR calc non Af Amer 56 (L) >60 mL/min   GFR calc Af Amer >60 >60 mL/min   Anion gap 5 5 - 15  CBC  Result Value Ref Range   WBC 8.6 3.6 - 11.0 K/uL   RBC 4.16 3.80 - 5.20 MIL/uL   Hemoglobin 13.2 12.0 - 16.0 g/dL   HCT 37.7 35.0 - 47.0 %   MCV 90.6 80.0 - 100.0 fL   MCH 31.8 26.0 - 34.0 pg   MCHC 35.0 32.0 - 36.0 g/dL   RDW 13.0 11.5 - 14.5 %   Platelets 137 (L) 150 - 440 K/uL  Glucose, capillary  Result Value Ref Range   Glucose-Capillary 107 (H) 65 - 99 mg/dL  CK  Result Value Ref Range   Total CK 745 (H) 38 - 234 U/L  Basic metabolic panel  Result Value Ref Range   Sodium 137 135 - 145 mmol/L   Potassium 3.2 (L) 3.5 - 5.1 mmol/L   Chloride 110 101 - 111 mmol/L   CO2 24 22 - 32 mmol/L   Glucose, Bld 116 (H) 65 - 99 mg/dL   BUN 8 6 - 20 mg/dL   Creatinine, Ser 0.54 0.44 - 1.00 mg/dL   Calcium 8.6 (L) 8.9 - 10.3 mg/dL   GFR calc non Af Amer >60 >60 mL/min   GFR calc Af Amer >60 >60 mL/min   Anion gap 3 (L) 5 - 15  CBC  Result Value Ref Range   WBC 5.4 3.6 - 11.0 K/uL   RBC 3.58 (L) 3.80 - 5.20 MIL/uL   Hemoglobin 12.0 12.0 - 16.0 g/dL   HCT 33.8 (L) 35.0 - 47.0 %   MCV 94.5 80.0 - 100.0 fL   MCH 33.4 26.0 - 34.0 pg   MCHC 35.4 32.0 - 36.0 g/dL   RDW 13.0 11.5 - 14.5 %   Platelets 120 (L) 150 - 440 K/uL    CK  Result Value Ref Range   Total CK 506 (H) 38 - 234 U/L  Glucose, capillary  Result Value Ref Range   Glucose-Capillary 109 (H) 65 - 99 mg/dL  Magnesium  Result Value Ref Range   Magnesium 1.7 1.7 - 2.4 mg/dL  CK  Result Value Ref Range   Total CK 309 (H) 38 - 234 U/L  Basic metabolic panel  Result Value Ref Range   Sodium 140 135 - 145 mmol/L   Potassium 3.4 (L) 3.5 - 5.1 mmol/L   Chloride 109 101 - 111 mmol/L   CO2 27 22 - 32 mmol/L   Glucose, Bld 117 (H) 65 - 99 mg/dL   BUN 6 6 - 20 mg/dL   Creatinine, Ser 0.44 0.44 - 1.00 mg/dL   Calcium 8.7 (L) 8.9 - 10.3 mg/dL   GFR calc non Af Amer >60 >60 mL/min   GFR calc Af Amer >60 >60 mL/min   Anion gap 4 (L) 5 - 15  CK  Result Value Ref Range   Total CK 225 38 - 234 U/L  Glucose, capillary  Result Value Ref Range   Glucose-Capillary 130 (H) 65 - 99 mg/dL  CK  Result Value Ref Range   Total CK 114 38 - 234 U/L  Glucose, capillary  Result Value Ref Range   Glucose-Capillary 127 (H) 65 - 99 mg/dL   Comment 1 Notify RN    Comment 2 Document in Chart   Basic metabolic panel  Result Value Ref Range   Sodium 139 135 - 145 mmol/L   Potassium 3.8 3.5 - 5.1 mmol/L   Chloride 109 101 - 111 mmol/L   CO2 25 22 - 32 mmol/L   Glucose, Bld 107 (H) 65 - 99 mg/dL   BUN 6 6 - 20 mg/dL   Creatinine, Ser 0.48 0.44 - 1.00 mg/dL   Calcium 9.4 8.9 - 10.3 mg/dL   GFR calc non Af Amer >60 >60 mL/min   GFR calc Af Amer >60 >60 mL/min   Anion gap 5 5 - 15  CBC  Result Value Ref Range   WBC 5.3 3.6 - 11.0 K/uL   RBC 4.23 3.80 - 5.20 MIL/uL  Hemoglobin 13.3 12.0 - 16.0 g/dL   HCT 38.6 35.0 - 47.0 %   MCV 91.1 80.0 - 100.0 fL   MCH 31.4 26.0 - 34.0 pg   MCHC 34.4 32.0 - 36.0 g/dL   RDW 13.2 11.5 - 14.5 %   Platelets 147 (L) 150 - 440 K/uL  Glucose, capillary  Result Value Ref Range   Glucose-Capillary 118 (H) 65 - 99 mg/dL      Assessment & Plan:   Problem List Items Addressed This Visit      Cardiovascular and Mediastinum    Essential hypertension - Primary    The current medical regimen is effective;  continue present plan and medications.       Relevant Orders   Basic metabolic panel   Venous insufficiency of both lower extremities    Reviewed treatments and therapies with compression and compression aids along with elevation      Atherosclerosis of both carotid arteries   Relevant Orders   LP+ALT+AST Piccolo, Waived   Senile purpura (HCC)     Musculoskeletal and Integument   Primary osteoarthritis of both shoulders    Reviewed care and treatment        Other   Goals of care, counseling/discussion    Done extensively with family       Other Visit Diagnoses    Thyroid disorder screen       Relevant Orders   TSH   Needs flu shot       Relevant Orders   Flu vaccine HIGH DOSE PF (Fluzone High dose) (Completed)       Follow up plan: Return in about 6 months (around 12/06/2017).

## 2017-06-08 NOTE — Assessment & Plan Note (Signed)
The current medical regimen is effective;  continue present plan and medications.  

## 2017-06-08 NOTE — Assessment & Plan Note (Signed)
Done extensively with family

## 2017-06-08 NOTE — Assessment & Plan Note (Signed)
Reviewed treatments and therapies with compression and compression aids along with elevation

## 2017-06-08 NOTE — Assessment & Plan Note (Signed)
Reviewed care and treatment

## 2017-06-09 ENCOUNTER — Encounter: Payer: Self-pay | Admitting: Family Medicine

## 2017-06-09 LAB — BASIC METABOLIC PANEL
BUN / CREAT RATIO: 25 (ref 12–28)
BUN: 14 mg/dL (ref 8–27)
CO2: 25 mmol/L (ref 20–29)
CREATININE: 0.56 mg/dL — AB (ref 0.57–1.00)
Calcium: 10 mg/dL (ref 8.7–10.3)
Chloride: 104 mmol/L (ref 96–106)
GFR, EST AFRICAN AMERICAN: 97 mL/min/{1.73_m2} (ref >59)
GFR, EST NON AFRICAN AMERICAN: 84 mL/min/{1.73_m2} (ref >59)
Glucose: 113 mg/dL — ABNORMAL HIGH (ref 65–99)
Potassium: 3.6 mmol/L (ref 3.5–5.2)
SODIUM: 140 mmol/L (ref 134–144)

## 2017-06-09 LAB — TSH: TSH: 2.25 u[IU]/mL (ref 0.450–4.500)

## 2017-06-10 DIAGNOSIS — I69398 Other sequelae of cerebral infarction: Secondary | ICD-10-CM | POA: Diagnosis not present

## 2017-06-10 DIAGNOSIS — I10 Essential (primary) hypertension: Secondary | ICD-10-CM | POA: Diagnosis not present

## 2017-06-10 DIAGNOSIS — M6281 Muscle weakness (generalized): Secondary | ICD-10-CM | POA: Diagnosis not present

## 2017-06-10 DIAGNOSIS — I2510 Atherosclerotic heart disease of native coronary artery without angina pectoris: Secondary | ICD-10-CM | POA: Diagnosis not present

## 2017-06-10 DIAGNOSIS — M1991 Primary osteoarthritis, unspecified site: Secondary | ICD-10-CM | POA: Diagnosis not present

## 2017-06-10 DIAGNOSIS — I872 Venous insufficiency (chronic) (peripheral): Secondary | ICD-10-CM | POA: Diagnosis not present

## 2017-06-12 DIAGNOSIS — I69398 Other sequelae of cerebral infarction: Secondary | ICD-10-CM | POA: Diagnosis not present

## 2017-06-12 DIAGNOSIS — I10 Essential (primary) hypertension: Secondary | ICD-10-CM | POA: Diagnosis not present

## 2017-06-12 DIAGNOSIS — M1991 Primary osteoarthritis, unspecified site: Secondary | ICD-10-CM | POA: Diagnosis not present

## 2017-06-12 DIAGNOSIS — M6281 Muscle weakness (generalized): Secondary | ICD-10-CM | POA: Diagnosis not present

## 2017-06-12 DIAGNOSIS — I872 Venous insufficiency (chronic) (peripheral): Secondary | ICD-10-CM | POA: Diagnosis not present

## 2017-06-12 DIAGNOSIS — I2510 Atherosclerotic heart disease of native coronary artery without angina pectoris: Secondary | ICD-10-CM | POA: Diagnosis not present

## 2017-06-17 DIAGNOSIS — I2510 Atherosclerotic heart disease of native coronary artery without angina pectoris: Secondary | ICD-10-CM | POA: Diagnosis not present

## 2017-06-17 DIAGNOSIS — I69398 Other sequelae of cerebral infarction: Secondary | ICD-10-CM | POA: Diagnosis not present

## 2017-06-17 DIAGNOSIS — I872 Venous insufficiency (chronic) (peripheral): Secondary | ICD-10-CM | POA: Diagnosis not present

## 2017-06-17 DIAGNOSIS — M6281 Muscle weakness (generalized): Secondary | ICD-10-CM | POA: Diagnosis not present

## 2017-06-17 DIAGNOSIS — I10 Essential (primary) hypertension: Secondary | ICD-10-CM | POA: Diagnosis not present

## 2017-06-17 DIAGNOSIS — M1991 Primary osteoarthritis, unspecified site: Secondary | ICD-10-CM | POA: Diagnosis not present

## 2017-06-19 DIAGNOSIS — M6281 Muscle weakness (generalized): Secondary | ICD-10-CM | POA: Diagnosis not present

## 2017-06-19 DIAGNOSIS — M1991 Primary osteoarthritis, unspecified site: Secondary | ICD-10-CM | POA: Diagnosis not present

## 2017-06-19 DIAGNOSIS — I10 Essential (primary) hypertension: Secondary | ICD-10-CM | POA: Diagnosis not present

## 2017-06-19 DIAGNOSIS — I2510 Atherosclerotic heart disease of native coronary artery without angina pectoris: Secondary | ICD-10-CM | POA: Diagnosis not present

## 2017-06-19 DIAGNOSIS — I69398 Other sequelae of cerebral infarction: Secondary | ICD-10-CM | POA: Diagnosis not present

## 2017-06-19 DIAGNOSIS — I872 Venous insufficiency (chronic) (peripheral): Secondary | ICD-10-CM | POA: Diagnosis not present

## 2017-06-23 DIAGNOSIS — I2510 Atherosclerotic heart disease of native coronary artery without angina pectoris: Secondary | ICD-10-CM | POA: Diagnosis not present

## 2017-06-23 DIAGNOSIS — I872 Venous insufficiency (chronic) (peripheral): Secondary | ICD-10-CM | POA: Diagnosis not present

## 2017-06-23 DIAGNOSIS — I10 Essential (primary) hypertension: Secondary | ICD-10-CM | POA: Diagnosis not present

## 2017-06-23 DIAGNOSIS — M6281 Muscle weakness (generalized): Secondary | ICD-10-CM | POA: Diagnosis not present

## 2017-06-23 DIAGNOSIS — I69398 Other sequelae of cerebral infarction: Secondary | ICD-10-CM | POA: Diagnosis not present

## 2017-06-23 DIAGNOSIS — M1991 Primary osteoarthritis, unspecified site: Secondary | ICD-10-CM | POA: Diagnosis not present

## 2017-06-26 DIAGNOSIS — I872 Venous insufficiency (chronic) (peripheral): Secondary | ICD-10-CM | POA: Diagnosis not present

## 2017-06-26 DIAGNOSIS — I2510 Atherosclerotic heart disease of native coronary artery without angina pectoris: Secondary | ICD-10-CM | POA: Diagnosis not present

## 2017-06-26 DIAGNOSIS — I10 Essential (primary) hypertension: Secondary | ICD-10-CM | POA: Diagnosis not present

## 2017-06-26 DIAGNOSIS — M6281 Muscle weakness (generalized): Secondary | ICD-10-CM | POA: Diagnosis not present

## 2017-06-26 DIAGNOSIS — M1991 Primary osteoarthritis, unspecified site: Secondary | ICD-10-CM | POA: Diagnosis not present

## 2017-06-26 DIAGNOSIS — I69398 Other sequelae of cerebral infarction: Secondary | ICD-10-CM | POA: Diagnosis not present

## 2017-06-27 DIAGNOSIS — I872 Venous insufficiency (chronic) (peripheral): Secondary | ICD-10-CM | POA: Diagnosis not present

## 2017-06-27 DIAGNOSIS — M6281 Muscle weakness (generalized): Secondary | ICD-10-CM | POA: Diagnosis not present

## 2017-06-27 DIAGNOSIS — I69398 Other sequelae of cerebral infarction: Secondary | ICD-10-CM | POA: Diagnosis not present

## 2017-06-27 DIAGNOSIS — I2510 Atherosclerotic heart disease of native coronary artery without angina pectoris: Secondary | ICD-10-CM | POA: Diagnosis not present

## 2017-06-27 DIAGNOSIS — I10 Essential (primary) hypertension: Secondary | ICD-10-CM | POA: Diagnosis not present

## 2017-06-27 DIAGNOSIS — M1991 Primary osteoarthritis, unspecified site: Secondary | ICD-10-CM | POA: Diagnosis not present

## 2017-06-30 DIAGNOSIS — I2510 Atherosclerotic heart disease of native coronary artery without angina pectoris: Secondary | ICD-10-CM | POA: Diagnosis not present

## 2017-06-30 DIAGNOSIS — I69398 Other sequelae of cerebral infarction: Secondary | ICD-10-CM | POA: Diagnosis not present

## 2017-06-30 DIAGNOSIS — I872 Venous insufficiency (chronic) (peripheral): Secondary | ICD-10-CM | POA: Diagnosis not present

## 2017-06-30 DIAGNOSIS — M6281 Muscle weakness (generalized): Secondary | ICD-10-CM | POA: Diagnosis not present

## 2017-06-30 DIAGNOSIS — I10 Essential (primary) hypertension: Secondary | ICD-10-CM | POA: Diagnosis not present

## 2017-06-30 DIAGNOSIS — M1991 Primary osteoarthritis, unspecified site: Secondary | ICD-10-CM | POA: Diagnosis not present

## 2017-07-01 DIAGNOSIS — I10 Essential (primary) hypertension: Secondary | ICD-10-CM | POA: Diagnosis not present

## 2017-07-01 DIAGNOSIS — I872 Venous insufficiency (chronic) (peripheral): Secondary | ICD-10-CM | POA: Diagnosis not present

## 2017-07-01 DIAGNOSIS — M6281 Muscle weakness (generalized): Secondary | ICD-10-CM | POA: Diagnosis not present

## 2017-07-01 DIAGNOSIS — I2510 Atherosclerotic heart disease of native coronary artery without angina pectoris: Secondary | ICD-10-CM | POA: Diagnosis not present

## 2017-07-01 DIAGNOSIS — I69398 Other sequelae of cerebral infarction: Secondary | ICD-10-CM | POA: Diagnosis not present

## 2017-07-01 DIAGNOSIS — M1991 Primary osteoarthritis, unspecified site: Secondary | ICD-10-CM | POA: Diagnosis not present

## 2017-07-03 DIAGNOSIS — I2510 Atherosclerotic heart disease of native coronary artery without angina pectoris: Secondary | ICD-10-CM | POA: Diagnosis not present

## 2017-07-03 DIAGNOSIS — M1991 Primary osteoarthritis, unspecified site: Secondary | ICD-10-CM | POA: Diagnosis not present

## 2017-07-03 DIAGNOSIS — M6281 Muscle weakness (generalized): Secondary | ICD-10-CM | POA: Diagnosis not present

## 2017-07-03 DIAGNOSIS — I69398 Other sequelae of cerebral infarction: Secondary | ICD-10-CM | POA: Diagnosis not present

## 2017-07-03 DIAGNOSIS — I10 Essential (primary) hypertension: Secondary | ICD-10-CM | POA: Diagnosis not present

## 2017-07-03 DIAGNOSIS — I872 Venous insufficiency (chronic) (peripheral): Secondary | ICD-10-CM | POA: Diagnosis not present

## 2017-07-07 DIAGNOSIS — H35351 Cystoid macular degeneration, right eye: Secondary | ICD-10-CM | POA: Diagnosis not present

## 2017-07-07 DIAGNOSIS — M1991 Primary osteoarthritis, unspecified site: Secondary | ICD-10-CM | POA: Diagnosis not present

## 2017-07-07 DIAGNOSIS — I69398 Other sequelae of cerebral infarction: Secondary | ICD-10-CM | POA: Diagnosis not present

## 2017-07-07 DIAGNOSIS — M6281 Muscle weakness (generalized): Secondary | ICD-10-CM | POA: Diagnosis not present

## 2017-07-07 DIAGNOSIS — H02889 Meibomian gland dysfunction of unspecified eye, unspecified eyelid: Secondary | ICD-10-CM | POA: Diagnosis not present

## 2017-07-07 DIAGNOSIS — I2510 Atherosclerotic heart disease of native coronary artery without angina pectoris: Secondary | ICD-10-CM | POA: Diagnosis not present

## 2017-07-07 DIAGNOSIS — I10 Essential (primary) hypertension: Secondary | ICD-10-CM | POA: Diagnosis not present

## 2017-07-07 DIAGNOSIS — Z961 Presence of intraocular lens: Secondary | ICD-10-CM | POA: Diagnosis not present

## 2017-07-07 DIAGNOSIS — I872 Venous insufficiency (chronic) (peripheral): Secondary | ICD-10-CM | POA: Diagnosis not present

## 2017-07-08 DIAGNOSIS — M6281 Muscle weakness (generalized): Secondary | ICD-10-CM | POA: Diagnosis not present

## 2017-07-08 DIAGNOSIS — I69398 Other sequelae of cerebral infarction: Secondary | ICD-10-CM | POA: Diagnosis not present

## 2017-07-08 DIAGNOSIS — I2510 Atherosclerotic heart disease of native coronary artery without angina pectoris: Secondary | ICD-10-CM | POA: Diagnosis not present

## 2017-07-08 DIAGNOSIS — I10 Essential (primary) hypertension: Secondary | ICD-10-CM | POA: Diagnosis not present

## 2017-07-08 DIAGNOSIS — M1991 Primary osteoarthritis, unspecified site: Secondary | ICD-10-CM | POA: Diagnosis not present

## 2017-07-08 DIAGNOSIS — I872 Venous insufficiency (chronic) (peripheral): Secondary | ICD-10-CM | POA: Diagnosis not present

## 2017-07-10 DIAGNOSIS — I2510 Atherosclerotic heart disease of native coronary artery without angina pectoris: Secondary | ICD-10-CM | POA: Diagnosis not present

## 2017-07-10 DIAGNOSIS — I872 Venous insufficiency (chronic) (peripheral): Secondary | ICD-10-CM | POA: Diagnosis not present

## 2017-07-10 DIAGNOSIS — M1991 Primary osteoarthritis, unspecified site: Secondary | ICD-10-CM | POA: Diagnosis not present

## 2017-07-10 DIAGNOSIS — I10 Essential (primary) hypertension: Secondary | ICD-10-CM | POA: Diagnosis not present

## 2017-07-10 DIAGNOSIS — M6281 Muscle weakness (generalized): Secondary | ICD-10-CM | POA: Diagnosis not present

## 2017-07-10 DIAGNOSIS — I69398 Other sequelae of cerebral infarction: Secondary | ICD-10-CM | POA: Diagnosis not present

## 2017-07-15 DIAGNOSIS — M1991 Primary osteoarthritis, unspecified site: Secondary | ICD-10-CM | POA: Diagnosis not present

## 2017-07-15 DIAGNOSIS — I10 Essential (primary) hypertension: Secondary | ICD-10-CM | POA: Diagnosis not present

## 2017-07-15 DIAGNOSIS — I69398 Other sequelae of cerebral infarction: Secondary | ICD-10-CM | POA: Diagnosis not present

## 2017-07-15 DIAGNOSIS — I872 Venous insufficiency (chronic) (peripheral): Secondary | ICD-10-CM | POA: Diagnosis not present

## 2017-07-15 DIAGNOSIS — I2510 Atherosclerotic heart disease of native coronary artery without angina pectoris: Secondary | ICD-10-CM | POA: Diagnosis not present

## 2017-07-15 DIAGNOSIS — M6281 Muscle weakness (generalized): Secondary | ICD-10-CM | POA: Diagnosis not present

## 2017-07-17 DIAGNOSIS — I872 Venous insufficiency (chronic) (peripheral): Secondary | ICD-10-CM | POA: Diagnosis not present

## 2017-07-17 DIAGNOSIS — I10 Essential (primary) hypertension: Secondary | ICD-10-CM | POA: Diagnosis not present

## 2017-07-17 DIAGNOSIS — M1991 Primary osteoarthritis, unspecified site: Secondary | ICD-10-CM | POA: Diagnosis not present

## 2017-07-17 DIAGNOSIS — I69398 Other sequelae of cerebral infarction: Secondary | ICD-10-CM | POA: Diagnosis not present

## 2017-07-17 DIAGNOSIS — M6281 Muscle weakness (generalized): Secondary | ICD-10-CM | POA: Diagnosis not present

## 2017-07-17 DIAGNOSIS — I2510 Atherosclerotic heart disease of native coronary artery without angina pectoris: Secondary | ICD-10-CM | POA: Diagnosis not present

## 2017-07-21 DIAGNOSIS — I2510 Atherosclerotic heart disease of native coronary artery without angina pectoris: Secondary | ICD-10-CM | POA: Diagnosis not present

## 2017-07-21 DIAGNOSIS — I872 Venous insufficiency (chronic) (peripheral): Secondary | ICD-10-CM | POA: Diagnosis not present

## 2017-07-21 DIAGNOSIS — I10 Essential (primary) hypertension: Secondary | ICD-10-CM | POA: Diagnosis not present

## 2017-07-21 DIAGNOSIS — M1991 Primary osteoarthritis, unspecified site: Secondary | ICD-10-CM | POA: Diagnosis not present

## 2017-07-21 DIAGNOSIS — I69398 Other sequelae of cerebral infarction: Secondary | ICD-10-CM | POA: Diagnosis not present

## 2017-07-21 DIAGNOSIS — M6281 Muscle weakness (generalized): Secondary | ICD-10-CM | POA: Diagnosis not present

## 2017-07-22 DIAGNOSIS — I10 Essential (primary) hypertension: Secondary | ICD-10-CM | POA: Diagnosis not present

## 2017-07-22 DIAGNOSIS — I872 Venous insufficiency (chronic) (peripheral): Secondary | ICD-10-CM | POA: Diagnosis not present

## 2017-07-22 DIAGNOSIS — M1991 Primary osteoarthritis, unspecified site: Secondary | ICD-10-CM | POA: Diagnosis not present

## 2017-07-22 DIAGNOSIS — M6281 Muscle weakness (generalized): Secondary | ICD-10-CM | POA: Diagnosis not present

## 2017-07-22 DIAGNOSIS — I69398 Other sequelae of cerebral infarction: Secondary | ICD-10-CM | POA: Diagnosis not present

## 2017-07-22 DIAGNOSIS — I2510 Atherosclerotic heart disease of native coronary artery without angina pectoris: Secondary | ICD-10-CM | POA: Diagnosis not present

## 2017-07-24 DIAGNOSIS — I10 Essential (primary) hypertension: Secondary | ICD-10-CM | POA: Diagnosis not present

## 2017-07-24 DIAGNOSIS — I69398 Other sequelae of cerebral infarction: Secondary | ICD-10-CM | POA: Diagnosis not present

## 2017-07-24 DIAGNOSIS — I2510 Atherosclerotic heart disease of native coronary artery without angina pectoris: Secondary | ICD-10-CM | POA: Diagnosis not present

## 2017-07-24 DIAGNOSIS — M6281 Muscle weakness (generalized): Secondary | ICD-10-CM | POA: Diagnosis not present

## 2017-07-24 DIAGNOSIS — I872 Venous insufficiency (chronic) (peripheral): Secondary | ICD-10-CM | POA: Diagnosis not present

## 2017-07-24 DIAGNOSIS — M1991 Primary osteoarthritis, unspecified site: Secondary | ICD-10-CM | POA: Diagnosis not present

## 2017-07-27 DIAGNOSIS — M19012 Primary osteoarthritis, left shoulder: Secondary | ICD-10-CM | POA: Diagnosis not present

## 2017-07-27 DIAGNOSIS — M19011 Primary osteoarthritis, right shoulder: Secondary | ICD-10-CM | POA: Diagnosis not present

## 2017-07-28 DIAGNOSIS — I69398 Other sequelae of cerebral infarction: Secondary | ICD-10-CM | POA: Diagnosis not present

## 2017-07-28 DIAGNOSIS — I872 Venous insufficiency (chronic) (peripheral): Secondary | ICD-10-CM | POA: Diagnosis not present

## 2017-07-28 DIAGNOSIS — M6281 Muscle weakness (generalized): Secondary | ICD-10-CM | POA: Diagnosis not present

## 2017-07-28 DIAGNOSIS — M1991 Primary osteoarthritis, unspecified site: Secondary | ICD-10-CM | POA: Diagnosis not present

## 2017-07-28 DIAGNOSIS — I10 Essential (primary) hypertension: Secondary | ICD-10-CM | POA: Diagnosis not present

## 2017-07-28 DIAGNOSIS — I2510 Atherosclerotic heart disease of native coronary artery without angina pectoris: Secondary | ICD-10-CM | POA: Diagnosis not present

## 2017-07-28 DIAGNOSIS — M19011 Primary osteoarthritis, right shoulder: Secondary | ICD-10-CM | POA: Diagnosis not present

## 2017-07-28 DIAGNOSIS — M19012 Primary osteoarthritis, left shoulder: Secondary | ICD-10-CM | POA: Diagnosis not present

## 2017-07-29 DIAGNOSIS — I872 Venous insufficiency (chronic) (peripheral): Secondary | ICD-10-CM | POA: Diagnosis not present

## 2017-07-29 DIAGNOSIS — I10 Essential (primary) hypertension: Secondary | ICD-10-CM | POA: Diagnosis not present

## 2017-07-29 DIAGNOSIS — M1991 Primary osteoarthritis, unspecified site: Secondary | ICD-10-CM | POA: Diagnosis not present

## 2017-07-29 DIAGNOSIS — I69398 Other sequelae of cerebral infarction: Secondary | ICD-10-CM | POA: Diagnosis not present

## 2017-07-29 DIAGNOSIS — I2510 Atherosclerotic heart disease of native coronary artery without angina pectoris: Secondary | ICD-10-CM | POA: Diagnosis not present

## 2017-07-29 DIAGNOSIS — M6281 Muscle weakness (generalized): Secondary | ICD-10-CM | POA: Diagnosis not present

## 2017-07-31 DIAGNOSIS — I10 Essential (primary) hypertension: Secondary | ICD-10-CM | POA: Diagnosis not present

## 2017-07-31 DIAGNOSIS — I872 Venous insufficiency (chronic) (peripheral): Secondary | ICD-10-CM | POA: Diagnosis not present

## 2017-07-31 DIAGNOSIS — I2510 Atherosclerotic heart disease of native coronary artery without angina pectoris: Secondary | ICD-10-CM | POA: Diagnosis not present

## 2017-07-31 DIAGNOSIS — I69398 Other sequelae of cerebral infarction: Secondary | ICD-10-CM | POA: Diagnosis not present

## 2017-07-31 DIAGNOSIS — M6281 Muscle weakness (generalized): Secondary | ICD-10-CM | POA: Diagnosis not present

## 2017-07-31 DIAGNOSIS — M1991 Primary osteoarthritis, unspecified site: Secondary | ICD-10-CM | POA: Diagnosis not present

## 2017-08-12 ENCOUNTER — Ambulatory Visit: Payer: Self-pay | Admitting: *Deleted

## 2017-08-12 DIAGNOSIS — W19XXXS Unspecified fall, sequela: Secondary | ICD-10-CM

## 2017-08-12 DIAGNOSIS — M810 Age-related osteoporosis without current pathological fracture: Secondary | ICD-10-CM

## 2017-08-12 DIAGNOSIS — R0602 Shortness of breath: Secondary | ICD-10-CM

## 2017-08-12 MED ORDER — TRAMADOL HCL 50 MG PO TABS: 50.0000 mg | ORAL_TABLET | Freq: Three times a day (TID) | ORAL | 0 refills | Status: AC | PRN

## 2017-08-12 NOTE — Telephone Encounter (Signed)
Patient daughter in law is calling because her mother in law needs something for pain called into the [facility] [address] [phone]. Daughter in law is asking for a call back about this but she isn't on the patients DPR  Patient's daughter- in- law is calling to report that her mother- in- law is having a lot of pain in her R shoulder. She is not a surgical candidate and they are wanting something to treat her pain. It has become bothersome and painful for her to move. She is crying sometimes when  she uses it.  Patient is using [facility]. Explained to Jennifer Murray that she will need to get paperwork signed at the office if she is going to be the primary caregiver for the patient now. She is aware of the rules- we are not able to share health information with her until that is done.

## 2017-08-12 NOTE — Addendum Note (Signed)
Addended by: Golden Pop A on: 08/12/2017 01:44 PM   Modules accepted: Orders

## 2017-08-13 MED ORDER — CELECOXIB 100 MG PO CAPS: 100.0000 mg | ORAL_CAPSULE | Freq: Every day | ORAL | 2 refills | Status: AC | PRN

## 2017-08-13 NOTE — Telephone Encounter (Signed)
Phone call Discussed with family member patient's care and treatment needs to have physical therapy and bath therapy restarted.  Also wondering about taking Celebrex along with tramadol.  Will call in Celebrex.

## 2017-08-13 NOTE — Telephone Encounter (Signed)
PT and bath  okay through [facility]

## 2017-08-13 NOTE — Addendum Note (Signed)
Addended by: Golden Pop A on: 08/13/2017 12:42 PM   Modules accepted: Orders

## 2017-08-17 NOTE — Telephone Encounter (Signed)
Call family and ask who they used in the past for our referral.

## 2017-08-17 NOTE — Telephone Encounter (Signed)
[facility] returned fax that they did not offer these services. Please advise.

## 2017-08-18 NOTE — Telephone Encounter (Signed)
Home health submitted to [facility] after discussing with patient and reviewing media in chart.

## 2017-08-18 NOTE — Addendum Note (Signed)
Addended by: Gerda Diss A on: 08/18/2017 10:13 AM   Modules accepted: Orders

## 2017-08-20 DIAGNOSIS — I872 Venous insufficiency (chronic) (peripheral): Secondary | ICD-10-CM | POA: Diagnosis not present

## 2017-08-20 DIAGNOSIS — I2510 Atherosclerotic heart disease of native coronary artery without angina pectoris: Secondary | ICD-10-CM | POA: Diagnosis not present

## 2017-08-20 DIAGNOSIS — M810 Age-related osteoporosis without current pathological fracture: Secondary | ICD-10-CM | POA: Diagnosis not present

## 2017-08-20 DIAGNOSIS — M6281 Muscle weakness (generalized): Secondary | ICD-10-CM | POA: Diagnosis not present

## 2017-08-20 DIAGNOSIS — M1711 Unilateral primary osteoarthritis, right knee: Secondary | ICD-10-CM | POA: Diagnosis not present

## 2017-08-20 DIAGNOSIS — M19011 Primary osteoarthritis, right shoulder: Secondary | ICD-10-CM | POA: Diagnosis not present

## 2017-08-20 DIAGNOSIS — M160 Bilateral primary osteoarthritis of hip: Secondary | ICD-10-CM | POA: Diagnosis not present

## 2017-08-20 DIAGNOSIS — E662 Morbid (severe) obesity with alveolar hypoventilation: Secondary | ICD-10-CM | POA: Diagnosis not present

## 2017-08-20 DIAGNOSIS — I10 Essential (primary) hypertension: Secondary | ICD-10-CM | POA: Diagnosis not present

## 2017-08-20 DIAGNOSIS — M19012 Primary osteoarthritis, left shoulder: Secondary | ICD-10-CM | POA: Diagnosis not present

## 2017-08-20 DIAGNOSIS — I69398 Other sequelae of cerebral infarction: Secondary | ICD-10-CM | POA: Diagnosis not present

## 2017-08-24 DIAGNOSIS — H35033 Hypertensive retinopathy, bilateral: Secondary | ICD-10-CM | POA: Diagnosis not present

## 2017-08-24 DIAGNOSIS — Z961 Presence of intraocular lens: Secondary | ICD-10-CM | POA: Diagnosis not present

## 2017-08-24 DIAGNOSIS — H26491 Other secondary cataract, right eye: Secondary | ICD-10-CM | POA: Diagnosis not present

## 2017-08-24 DIAGNOSIS — H35042 Retinal micro-aneurysms, unspecified, left eye: Secondary | ICD-10-CM | POA: Diagnosis not present

## 2017-08-25 DIAGNOSIS — M160 Bilateral primary osteoarthritis of hip: Secondary | ICD-10-CM | POA: Diagnosis not present

## 2017-08-25 DIAGNOSIS — M19011 Primary osteoarthritis, right shoulder: Secondary | ICD-10-CM | POA: Diagnosis not present

## 2017-08-25 DIAGNOSIS — M19012 Primary osteoarthritis, left shoulder: Secondary | ICD-10-CM | POA: Diagnosis not present

## 2017-08-25 DIAGNOSIS — I69398 Other sequelae of cerebral infarction: Secondary | ICD-10-CM | POA: Diagnosis not present

## 2017-08-25 DIAGNOSIS — M6281 Muscle weakness (generalized): Secondary | ICD-10-CM | POA: Diagnosis not present

## 2017-08-25 DIAGNOSIS — M1711 Unilateral primary osteoarthritis, right knee: Secondary | ICD-10-CM | POA: Diagnosis not present

## 2017-08-28 DIAGNOSIS — M19012 Primary osteoarthritis, left shoulder: Secondary | ICD-10-CM | POA: Diagnosis not present

## 2017-08-28 DIAGNOSIS — I69398 Other sequelae of cerebral infarction: Secondary | ICD-10-CM | POA: Diagnosis not present

## 2017-08-28 DIAGNOSIS — M19011 Primary osteoarthritis, right shoulder: Secondary | ICD-10-CM | POA: Diagnosis not present

## 2017-08-28 DIAGNOSIS — M6281 Muscle weakness (generalized): Secondary | ICD-10-CM | POA: Diagnosis not present

## 2017-08-28 DIAGNOSIS — M160 Bilateral primary osteoarthritis of hip: Secondary | ICD-10-CM | POA: Diagnosis not present

## 2017-08-28 DIAGNOSIS — M1711 Unilateral primary osteoarthritis, right knee: Secondary | ICD-10-CM | POA: Diagnosis not present

## 2017-08-31 DIAGNOSIS — M6281 Muscle weakness (generalized): Secondary | ICD-10-CM | POA: Diagnosis not present

## 2017-08-31 DIAGNOSIS — M1711 Unilateral primary osteoarthritis, right knee: Secondary | ICD-10-CM | POA: Diagnosis not present

## 2017-08-31 DIAGNOSIS — M160 Bilateral primary osteoarthritis of hip: Secondary | ICD-10-CM | POA: Diagnosis not present

## 2017-08-31 DIAGNOSIS — M19011 Primary osteoarthritis, right shoulder: Secondary | ICD-10-CM | POA: Diagnosis not present

## 2017-08-31 DIAGNOSIS — I69398 Other sequelae of cerebral infarction: Secondary | ICD-10-CM | POA: Diagnosis not present

## 2017-08-31 DIAGNOSIS — M19012 Primary osteoarthritis, left shoulder: Secondary | ICD-10-CM | POA: Diagnosis not present

## 2017-09-04 DIAGNOSIS — M1711 Unilateral primary osteoarthritis, right knee: Secondary | ICD-10-CM | POA: Diagnosis not present

## 2017-09-04 DIAGNOSIS — M160 Bilateral primary osteoarthritis of hip: Secondary | ICD-10-CM | POA: Diagnosis not present

## 2017-09-04 DIAGNOSIS — M6281 Muscle weakness (generalized): Secondary | ICD-10-CM | POA: Diagnosis not present

## 2017-09-04 DIAGNOSIS — M19011 Primary osteoarthritis, right shoulder: Secondary | ICD-10-CM | POA: Diagnosis not present

## 2017-09-04 DIAGNOSIS — I69398 Other sequelae of cerebral infarction: Secondary | ICD-10-CM | POA: Diagnosis not present

## 2017-09-04 DIAGNOSIS — M19012 Primary osteoarthritis, left shoulder: Secondary | ICD-10-CM | POA: Diagnosis not present

## 2017-09-08 DIAGNOSIS — M6281 Muscle weakness (generalized): Secondary | ICD-10-CM | POA: Diagnosis not present

## 2017-09-08 DIAGNOSIS — M19011 Primary osteoarthritis, right shoulder: Secondary | ICD-10-CM | POA: Diagnosis not present

## 2017-09-08 DIAGNOSIS — I69398 Other sequelae of cerebral infarction: Secondary | ICD-10-CM | POA: Diagnosis not present

## 2017-09-08 DIAGNOSIS — M1711 Unilateral primary osteoarthritis, right knee: Secondary | ICD-10-CM | POA: Diagnosis not present

## 2017-09-08 DIAGNOSIS — M19012 Primary osteoarthritis, left shoulder: Secondary | ICD-10-CM | POA: Diagnosis not present

## 2017-09-08 DIAGNOSIS — M160 Bilateral primary osteoarthritis of hip: Secondary | ICD-10-CM | POA: Diagnosis not present

## 2017-09-09 DIAGNOSIS — M6281 Muscle weakness (generalized): Secondary | ICD-10-CM | POA: Diagnosis not present

## 2017-09-09 DIAGNOSIS — M19012 Primary osteoarthritis, left shoulder: Secondary | ICD-10-CM | POA: Diagnosis not present

## 2017-09-09 DIAGNOSIS — I69398 Other sequelae of cerebral infarction: Secondary | ICD-10-CM | POA: Diagnosis not present

## 2017-09-09 DIAGNOSIS — M19011 Primary osteoarthritis, right shoulder: Secondary | ICD-10-CM | POA: Diagnosis not present

## 2017-09-09 DIAGNOSIS — M160 Bilateral primary osteoarthritis of hip: Secondary | ICD-10-CM | POA: Diagnosis not present

## 2017-09-09 DIAGNOSIS — M1711 Unilateral primary osteoarthritis, right knee: Secondary | ICD-10-CM | POA: Diagnosis not present

## 2017-09-10 DIAGNOSIS — M160 Bilateral primary osteoarthritis of hip: Secondary | ICD-10-CM | POA: Diagnosis not present

## 2017-09-10 DIAGNOSIS — M19012 Primary osteoarthritis, left shoulder: Secondary | ICD-10-CM | POA: Diagnosis not present

## 2017-09-10 DIAGNOSIS — I69398 Other sequelae of cerebral infarction: Secondary | ICD-10-CM | POA: Diagnosis not present

## 2017-09-10 DIAGNOSIS — M19011 Primary osteoarthritis, right shoulder: Secondary | ICD-10-CM | POA: Diagnosis not present

## 2017-09-10 DIAGNOSIS — M6281 Muscle weakness (generalized): Secondary | ICD-10-CM | POA: Diagnosis not present

## 2017-09-10 DIAGNOSIS — M1711 Unilateral primary osteoarthritis, right knee: Secondary | ICD-10-CM | POA: Diagnosis not present

## 2017-09-11 ENCOUNTER — Telehealth: Payer: Self-pay | Admitting: Family Medicine

## 2017-09-11 NOTE — Telephone Encounter (Signed)
Copied from [place] [id number]. Topic: Medicare AWV >> Sep 11, 2017  1:16 PM Leo Rod wrote: Called to schedule Medicare Annual Wellness Visit with Nurse Health Advisor. If patient returns call please note: their last AWV was on 5/8 /17 please schedule AWV with [facility] any date  Thank you! For any questions please contact: Jill Alexanders [phone]  Skype [email or web address]

## 2017-09-15 DIAGNOSIS — M1711 Unilateral primary osteoarthritis, right knee: Secondary | ICD-10-CM | POA: Diagnosis not present

## 2017-09-15 DIAGNOSIS — M19011 Primary osteoarthritis, right shoulder: Secondary | ICD-10-CM | POA: Diagnosis not present

## 2017-09-15 DIAGNOSIS — M6281 Muscle weakness (generalized): Secondary | ICD-10-CM | POA: Diagnosis not present

## 2017-09-15 DIAGNOSIS — I69398 Other sequelae of cerebral infarction: Secondary | ICD-10-CM | POA: Diagnosis not present

## 2017-09-15 DIAGNOSIS — M160 Bilateral primary osteoarthritis of hip: Secondary | ICD-10-CM | POA: Diagnosis not present

## 2017-09-15 DIAGNOSIS — M19012 Primary osteoarthritis, left shoulder: Secondary | ICD-10-CM | POA: Diagnosis not present

## 2017-09-18 DIAGNOSIS — M6281 Muscle weakness (generalized): Secondary | ICD-10-CM | POA: Diagnosis not present

## 2017-09-18 DIAGNOSIS — M160 Bilateral primary osteoarthritis of hip: Secondary | ICD-10-CM | POA: Diagnosis not present

## 2017-09-18 DIAGNOSIS — I69398 Other sequelae of cerebral infarction: Secondary | ICD-10-CM | POA: Diagnosis not present

## 2017-09-18 DIAGNOSIS — M19011 Primary osteoarthritis, right shoulder: Secondary | ICD-10-CM | POA: Diagnosis not present

## 2017-09-18 DIAGNOSIS — M19012 Primary osteoarthritis, left shoulder: Secondary | ICD-10-CM | POA: Diagnosis not present

## 2017-09-18 DIAGNOSIS — M1711 Unilateral primary osteoarthritis, right knee: Secondary | ICD-10-CM | POA: Diagnosis not present

## 2017-09-22 DIAGNOSIS — M6281 Muscle weakness (generalized): Secondary | ICD-10-CM | POA: Diagnosis not present

## 2017-09-22 DIAGNOSIS — I69398 Other sequelae of cerebral infarction: Secondary | ICD-10-CM | POA: Diagnosis not present

## 2017-09-22 DIAGNOSIS — M160 Bilateral primary osteoarthritis of hip: Secondary | ICD-10-CM | POA: Diagnosis not present

## 2017-09-22 DIAGNOSIS — M19012 Primary osteoarthritis, left shoulder: Secondary | ICD-10-CM | POA: Diagnosis not present

## 2017-09-22 DIAGNOSIS — M19011 Primary osteoarthritis, right shoulder: Secondary | ICD-10-CM | POA: Diagnosis not present

## 2017-09-22 DIAGNOSIS — M1711 Unilateral primary osteoarthritis, right knee: Secondary | ICD-10-CM | POA: Diagnosis not present

## 2017-09-23 DIAGNOSIS — M160 Bilateral primary osteoarthritis of hip: Secondary | ICD-10-CM | POA: Diagnosis not present

## 2017-09-23 DIAGNOSIS — M1711 Unilateral primary osteoarthritis, right knee: Secondary | ICD-10-CM | POA: Diagnosis not present

## 2017-09-23 DIAGNOSIS — M19012 Primary osteoarthritis, left shoulder: Secondary | ICD-10-CM | POA: Diagnosis not present

## 2017-09-23 DIAGNOSIS — M19011 Primary osteoarthritis, right shoulder: Secondary | ICD-10-CM | POA: Diagnosis not present

## 2017-09-23 DIAGNOSIS — I69398 Other sequelae of cerebral infarction: Secondary | ICD-10-CM | POA: Diagnosis not present

## 2017-09-23 DIAGNOSIS — M6281 Muscle weakness (generalized): Secondary | ICD-10-CM | POA: Diagnosis not present

## 2017-09-25 DIAGNOSIS — M19011 Primary osteoarthritis, right shoulder: Secondary | ICD-10-CM | POA: Diagnosis not present

## 2017-09-25 DIAGNOSIS — M1711 Unilateral primary osteoarthritis, right knee: Secondary | ICD-10-CM | POA: Diagnosis not present

## 2017-09-25 DIAGNOSIS — M160 Bilateral primary osteoarthritis of hip: Secondary | ICD-10-CM | POA: Diagnosis not present

## 2017-09-25 DIAGNOSIS — M19012 Primary osteoarthritis, left shoulder: Secondary | ICD-10-CM | POA: Diagnosis not present

## 2017-09-25 DIAGNOSIS — I69398 Other sequelae of cerebral infarction: Secondary | ICD-10-CM | POA: Diagnosis not present

## 2017-09-25 DIAGNOSIS — M79674 Pain in right toe(s): Secondary | ICD-10-CM | POA: Diagnosis not present

## 2017-09-25 DIAGNOSIS — B351 Tinea unguium: Secondary | ICD-10-CM | POA: Diagnosis not present

## 2017-09-25 DIAGNOSIS — M79675 Pain in left toe(s): Secondary | ICD-10-CM | POA: Diagnosis not present

## 2017-09-25 DIAGNOSIS — M6281 Muscle weakness (generalized): Secondary | ICD-10-CM | POA: Diagnosis not present

## 2017-09-27 DIAGNOSIS — M19011 Primary osteoarthritis, right shoulder: Secondary | ICD-10-CM | POA: Diagnosis not present

## 2017-09-27 DIAGNOSIS — M160 Bilateral primary osteoarthritis of hip: Secondary | ICD-10-CM | POA: Diagnosis not present

## 2017-09-27 DIAGNOSIS — M1711 Unilateral primary osteoarthritis, right knee: Secondary | ICD-10-CM | POA: Diagnosis not present

## 2017-09-27 DIAGNOSIS — M19012 Primary osteoarthritis, left shoulder: Secondary | ICD-10-CM | POA: Diagnosis not present

## 2017-09-27 DIAGNOSIS — I69398 Other sequelae of cerebral infarction: Secondary | ICD-10-CM | POA: Diagnosis not present

## 2017-09-27 DIAGNOSIS — M6281 Muscle weakness (generalized): Secondary | ICD-10-CM | POA: Diagnosis not present

## 2017-09-28 DIAGNOSIS — M6281 Muscle weakness (generalized): Secondary | ICD-10-CM | POA: Diagnosis not present

## 2017-09-28 DIAGNOSIS — M19011 Primary osteoarthritis, right shoulder: Secondary | ICD-10-CM | POA: Diagnosis not present

## 2017-09-28 DIAGNOSIS — M1711 Unilateral primary osteoarthritis, right knee: Secondary | ICD-10-CM | POA: Diagnosis not present

## 2017-09-28 DIAGNOSIS — M160 Bilateral primary osteoarthritis of hip: Secondary | ICD-10-CM | POA: Diagnosis not present

## 2017-09-28 DIAGNOSIS — M19012 Primary osteoarthritis, left shoulder: Secondary | ICD-10-CM | POA: Diagnosis not present

## 2017-09-28 DIAGNOSIS — I69398 Other sequelae of cerebral infarction: Secondary | ICD-10-CM | POA: Diagnosis not present

## 2017-09-29 DIAGNOSIS — M1711 Unilateral primary osteoarthritis, right knee: Secondary | ICD-10-CM | POA: Diagnosis not present

## 2017-09-29 DIAGNOSIS — M19011 Primary osteoarthritis, right shoulder: Secondary | ICD-10-CM | POA: Diagnosis not present

## 2017-09-29 DIAGNOSIS — I69398 Other sequelae of cerebral infarction: Secondary | ICD-10-CM | POA: Diagnosis not present

## 2017-09-29 DIAGNOSIS — M19012 Primary osteoarthritis, left shoulder: Secondary | ICD-10-CM | POA: Diagnosis not present

## 2017-09-29 DIAGNOSIS — M6281 Muscle weakness (generalized): Secondary | ICD-10-CM | POA: Diagnosis not present

## 2017-09-29 DIAGNOSIS — M160 Bilateral primary osteoarthritis of hip: Secondary | ICD-10-CM | POA: Diagnosis not present

## 2017-09-30 DIAGNOSIS — I69398 Other sequelae of cerebral infarction: Secondary | ICD-10-CM | POA: Diagnosis not present

## 2017-09-30 DIAGNOSIS — M6281 Muscle weakness (generalized): Secondary | ICD-10-CM | POA: Diagnosis not present

## 2017-09-30 DIAGNOSIS — M19011 Primary osteoarthritis, right shoulder: Secondary | ICD-10-CM | POA: Diagnosis not present

## 2017-09-30 DIAGNOSIS — M160 Bilateral primary osteoarthritis of hip: Secondary | ICD-10-CM | POA: Diagnosis not present

## 2017-09-30 DIAGNOSIS — M1711 Unilateral primary osteoarthritis, right knee: Secondary | ICD-10-CM | POA: Diagnosis not present

## 2017-09-30 DIAGNOSIS — M19012 Primary osteoarthritis, left shoulder: Secondary | ICD-10-CM | POA: Diagnosis not present

## 2017-10-05 ENCOUNTER — Telehealth: Payer: Self-pay | Admitting: Family Medicine

## 2017-10-05 DIAGNOSIS — M160 Bilateral primary osteoarthritis of hip: Secondary | ICD-10-CM | POA: Diagnosis not present

## 2017-10-05 DIAGNOSIS — I469 Cardiac arrest, cause unspecified: Secondary | ICD-10-CM | POA: Diagnosis not present

## 2017-10-05 DIAGNOSIS — M1711 Unilateral primary osteoarthritis, right knee: Secondary | ICD-10-CM | POA: Diagnosis not present

## 2017-10-05 DIAGNOSIS — I69398 Other sequelae of cerebral infarction: Secondary | ICD-10-CM | POA: Diagnosis not present

## 2017-10-05 DIAGNOSIS — M19012 Primary osteoarthritis, left shoulder: Secondary | ICD-10-CM | POA: Diagnosis not present

## 2017-10-05 DIAGNOSIS — M19011 Primary osteoarthritis, right shoulder: Secondary | ICD-10-CM | POA: Diagnosis not present

## 2017-10-05 DIAGNOSIS — M6281 Muscle weakness (generalized): Secondary | ICD-10-CM | POA: Diagnosis not present

## 2017-10-05 NOTE — Telephone Encounter (Signed)
Josh from [facility] calling requesting to speak with on call provider regarding a death investigation on the pt. Pt's PCP called and notified. Conference call initiated with Josh.

## 2017-10-15 ENCOUNTER — Telehealth: Payer: Self-pay | Admitting: Family Medicine

## 2017-10-15 NOTE — Telephone Encounter (Signed)
Patient's daughter notified.

## 2017-10-15 NOTE — Telephone Encounter (Signed)
Copied from [place] [id number]. Topic: General - Other >> Oct 14, 2017 10:57 AM Synthia Innocent wrote: CRM for notification. Daughter calling Death certificate filled out that she was a unknown smoker and patient was not a smoker. Would like corrected. Please advise

## 2017-10-15 NOTE — Telephone Encounter (Signed)
Per her records she never smoked but was around a lot of smoke passively her whole life. This is why the unknown box was checked. I cannot know if that passive smoke exposure contributed to her heart attack.

## 2017-10-24 DIAGNOSIS — 419620001 Death: Secondary | SNOMED CT | POA: Diagnosis not present

## 2017-10-24 DEATH — deceased

## 2017-10-29 ENCOUNTER — Telehealth: Payer: Self-pay | Admitting: Family Medicine

## 2017-10-29 NOTE — Telephone Encounter (Signed)
Rita from [employer] called and stated that the pt would like to speak to Dr Jeananne Rama about this issue. He is not happy with the death certificate saying that the smoked box said unknown. Mr Foronda is waiting to hear from Dr Jeananne Rama before completing the process.

## 2017-10-29 NOTE — Telephone Encounter (Signed)
Erroneous entry

## 2017-10-29 NOTE — Telephone Encounter (Signed)
Call [facility]

## 2017-10-30 NOTE — Telephone Encounter (Signed)
Called funeral home. They did not have anyone available to discuss

## 2017-11-03 ENCOUNTER — Telehealth: Payer: Self-pay | Admitting: Family Medicine

## 2017-11-03 NOTE — Telephone Encounter (Signed)
Received call from [employer] wanting to speak with Dr. Jeananne Rama about the death certificate. She states that this is a month behind. Pts date of death 10-13-2017. She is requesting a call back today.

## 2017-11-03 NOTE — Telephone Encounter (Signed)
Rita from [employer] called and is very upset and stated she would like to speak to Dr Jeananne Rama about the issue. She is waiting to hear from Dr Jeananne Rama before completing the death certificate process. Please call today. [phone]

## 2017-11-16 ENCOUNTER — Ambulatory Visit: Payer: Medicare Other

## 2017-12-07 ENCOUNTER — Ambulatory Visit: Payer: Medicare Other | Admitting: Family Medicine

## 2017-12-17 ENCOUNTER — Ambulatory Visit (INDEPENDENT_AMBULATORY_CARE_PROVIDER_SITE_OTHER): Payer: Self-pay | Admitting: Vascular Surgery

## 2019-04-11 IMAGING — DX DG FOOT 2V*R*
2 series · 2 of 2 positions shown · non-contrast
Comparison: None.

CLINICAL DATA: 89-year-old female with bilateral foot pain since
falling off the toilet 3 days previously

EXAM:
RIGHT FOOT - 2 VIEW

[foot ap]
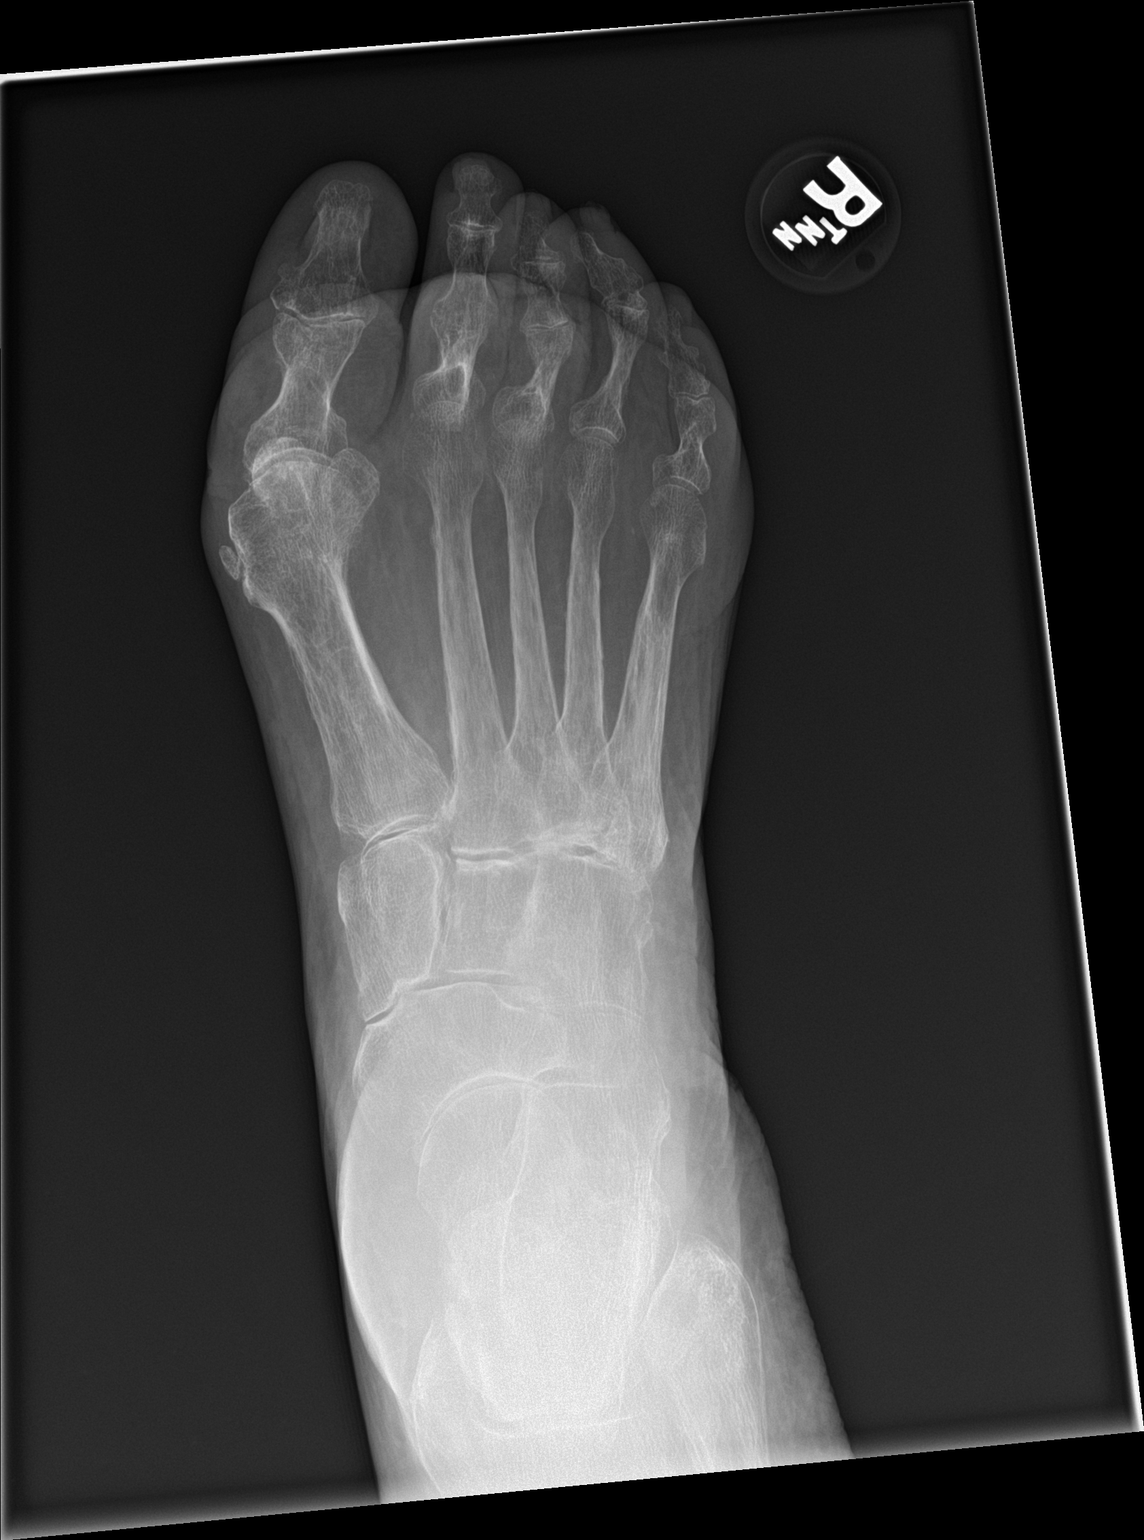

[foot lat]
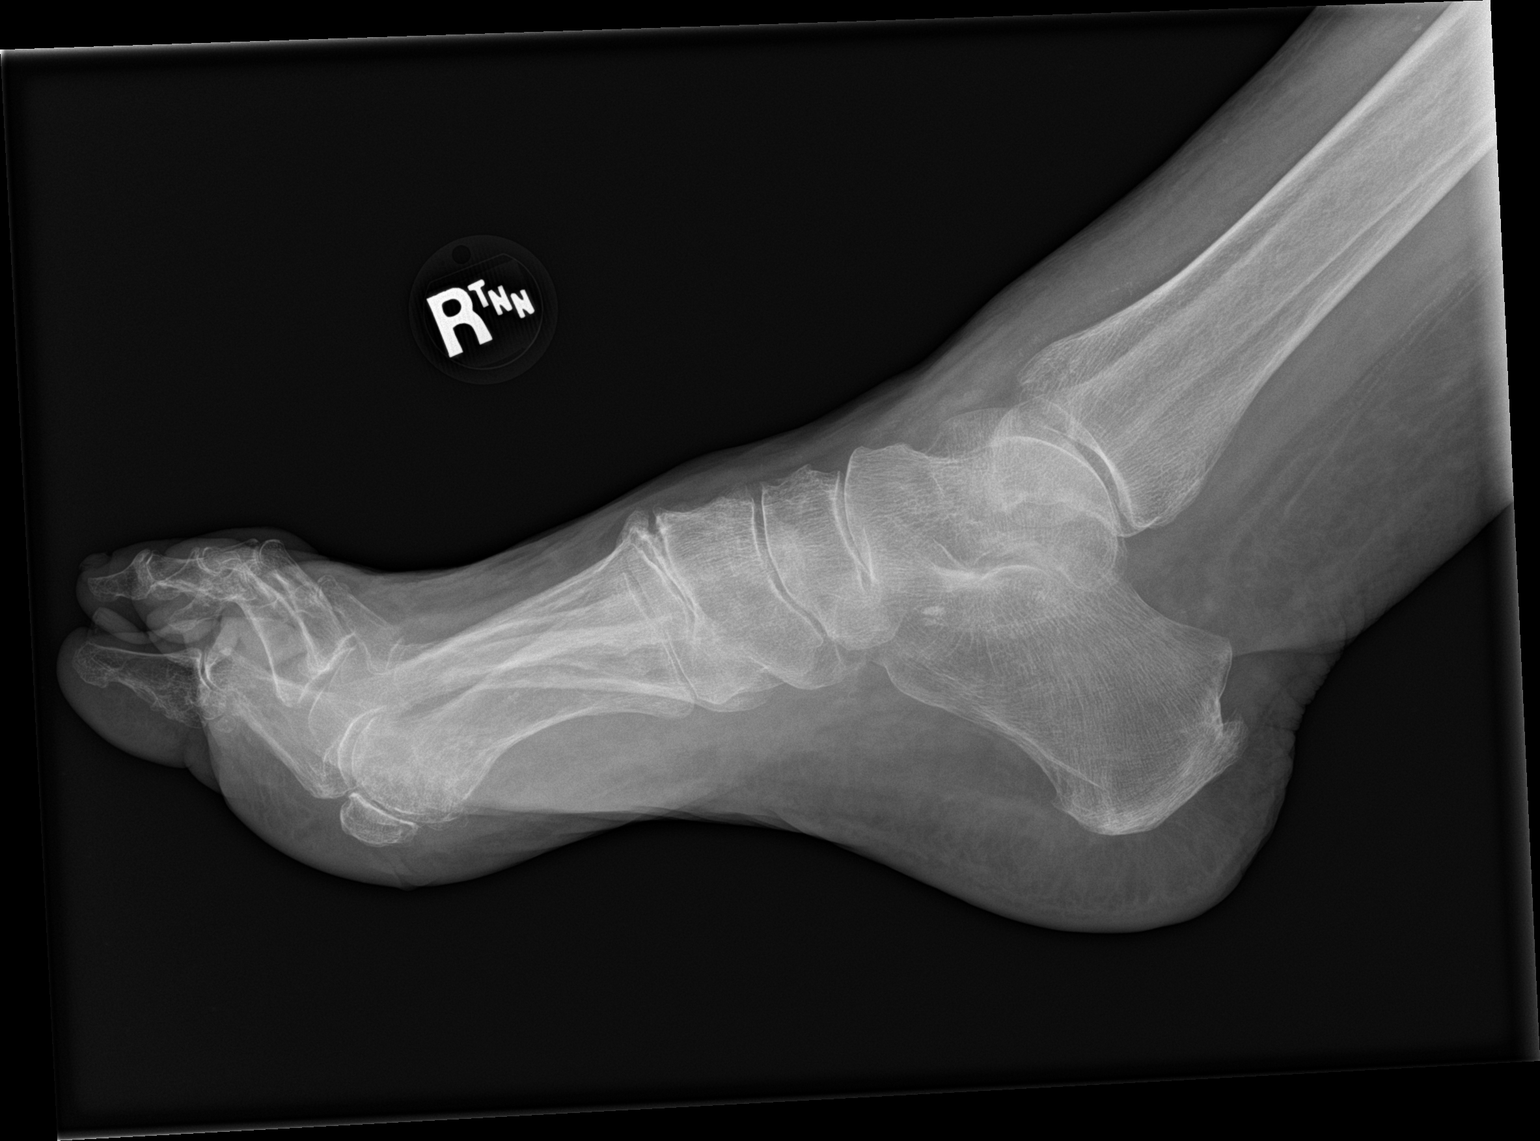

[2 of 2 positions shown; findings below may reference images not displayed]

FINDINGS: The bones are diffusely osteopenic. Hallux valgus angulation of the
great toe with bony bunion formation. Degenerative osteoarthritis at
the TMT joints. No focal acute fracture or malalignment.
IMPRESSION: 1. No acute fracture or malalignment.
2. Diffuse osteopenia.
3. Hallux valgus angulation of the great toe with bony bunion
formation.
4. Mild midfoot degenerative osteoarthritis.

## 2019-04-11 IMAGING — DX DG FOOT 2V*L*
2 series · 2 of 2 positions shown · non-contrast
Comparison: None.

CLINICAL DATA: Foot pain.  Fall from toilet

EXAM:
LEFT FOOT - 2 VIEW

[foot ap]
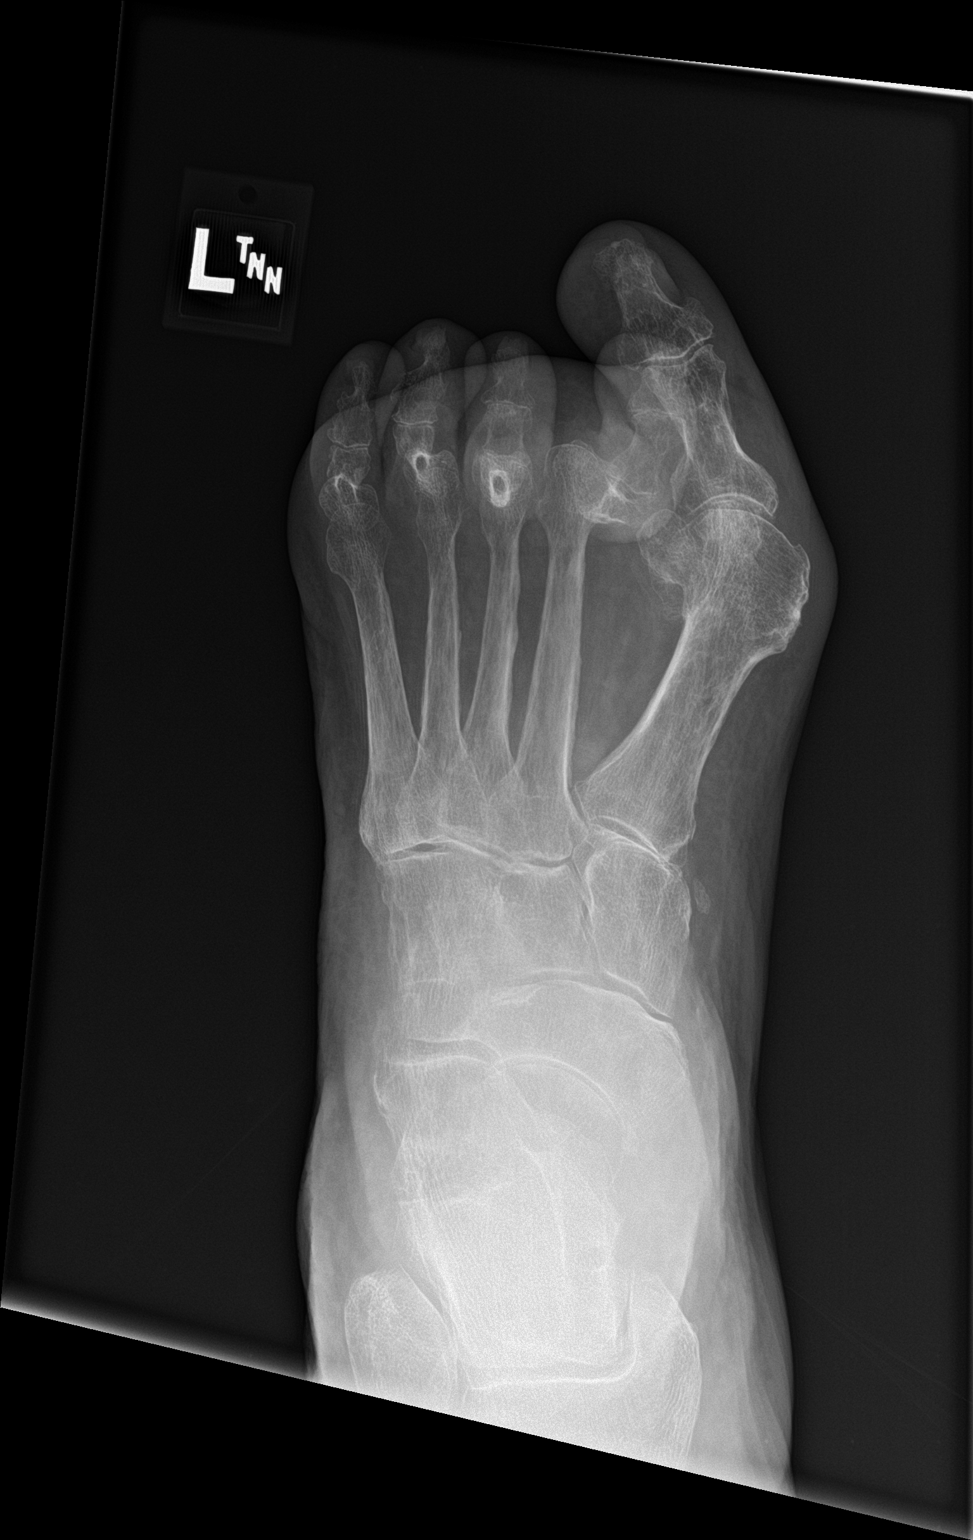

[foot lat]
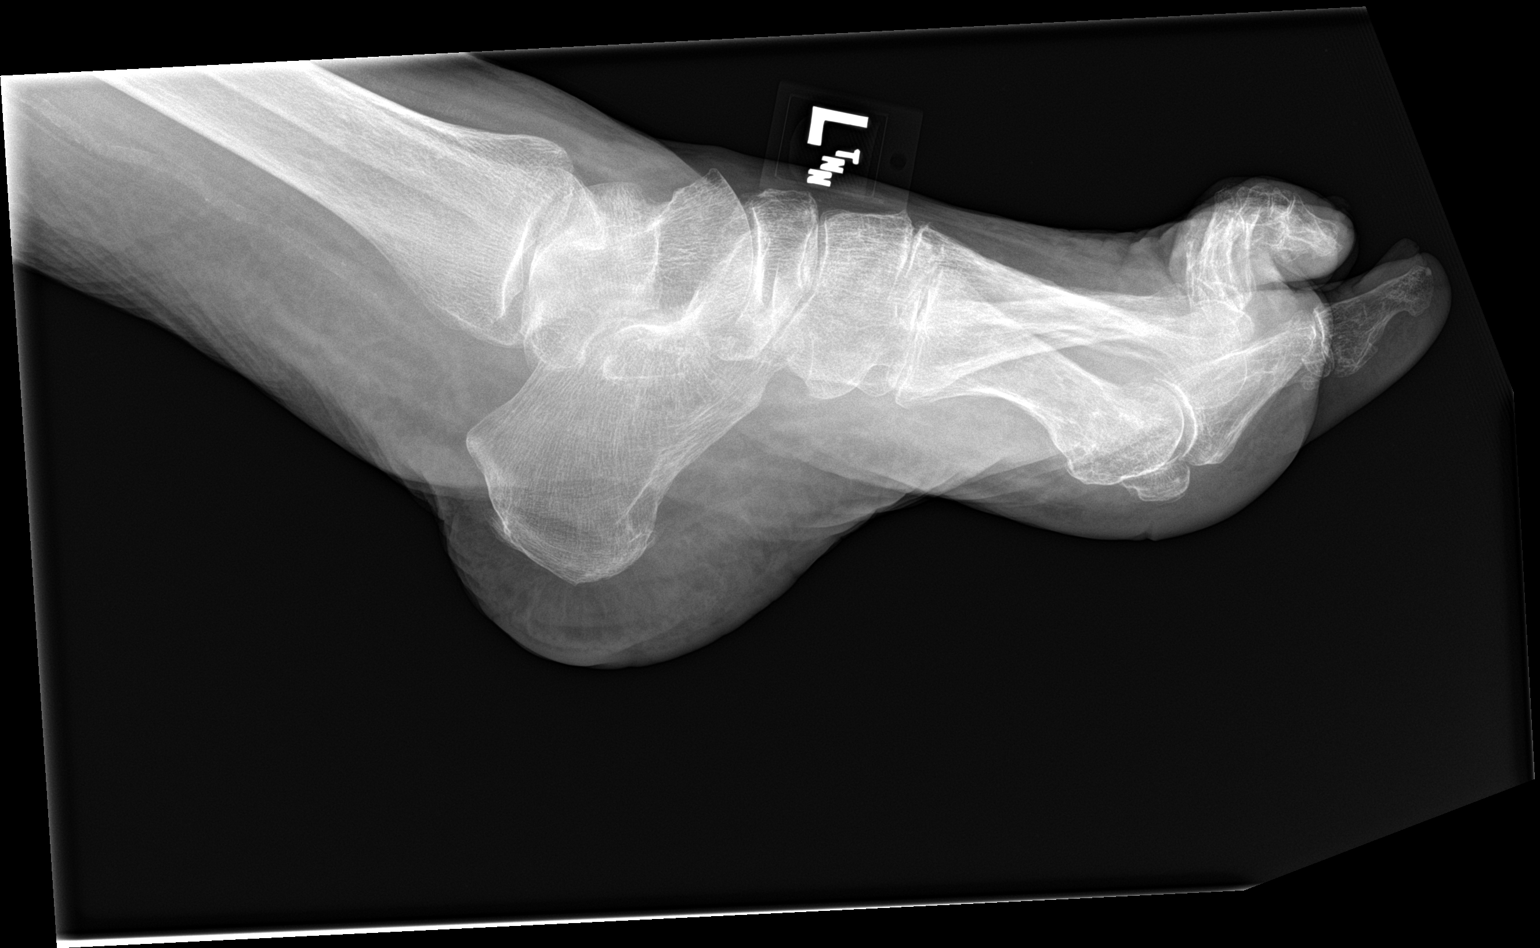

[2 of 2 positions shown; findings below may reference images not displayed]

FINDINGS: No fracture the hindfoot or midfoot. Potential remote fracture of
the metatarsal head of the first ray. There is also Hallux valgus
deformity.

The phalanges of the second ray are malaligned which is favored
chronic.
IMPRESSION: No acute findings of the foot.

Hallux valgus deformity of the first ray.

Deformity of the phalanges of the second digit is favor chronic.

## 2019-04-20 IMAGING — US US ABDOMEN LIMITED
1 series · 14 of 25 positions shown · non-contrast
Comparison: None.

CLINICAL DATA: Right upper quadrant pain and tenderness over the
last 3 days.

EXAM:
US ABDOMEN LIMITED - RIGHT UPPER QUADRANT

[Series 1: us abdomen limited · 0.19mm/px · 14 of 39 slices shown]
[im 1/39]
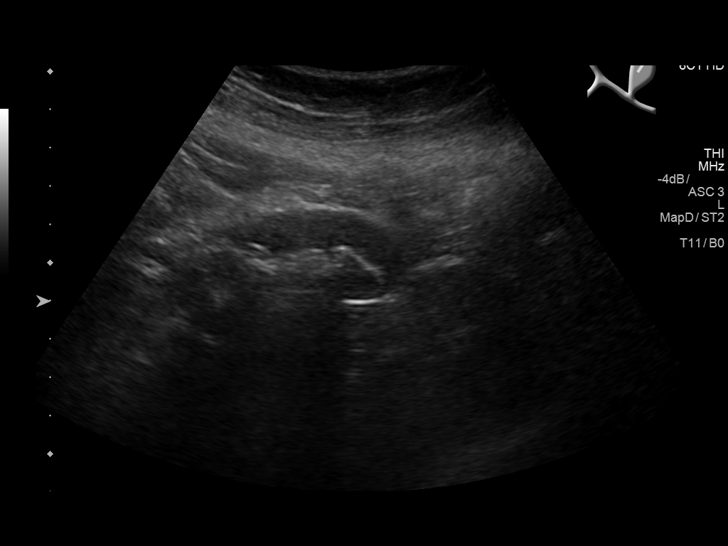
[im 4/39]
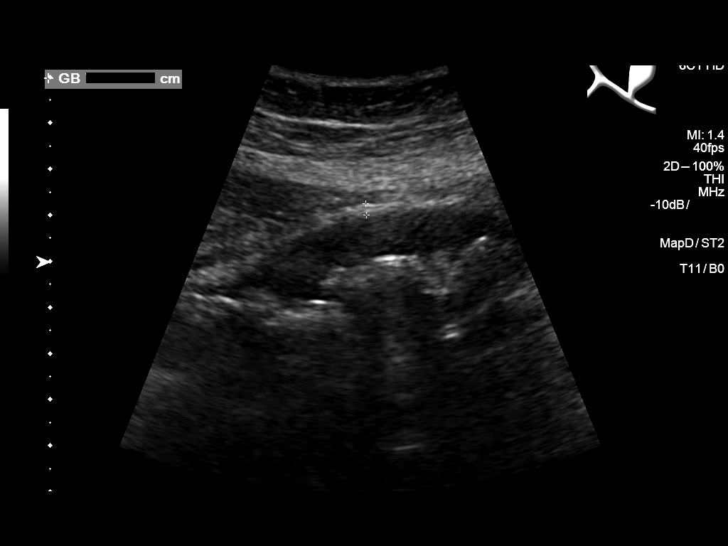
[im 7/39]
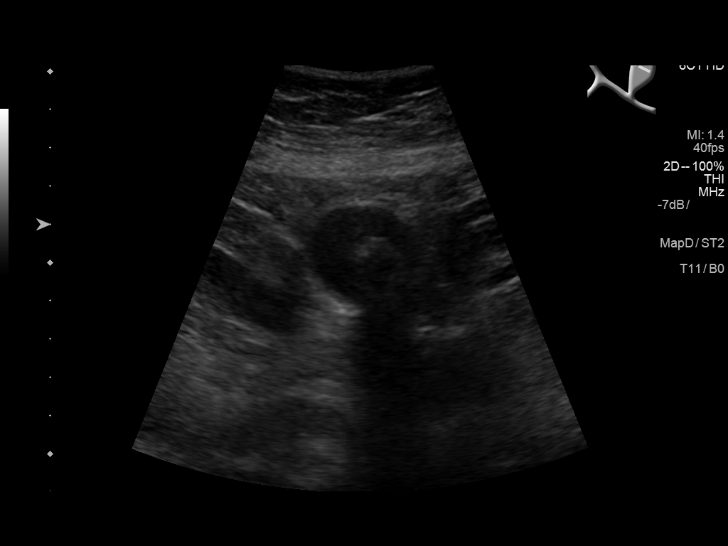
[im 10/39]
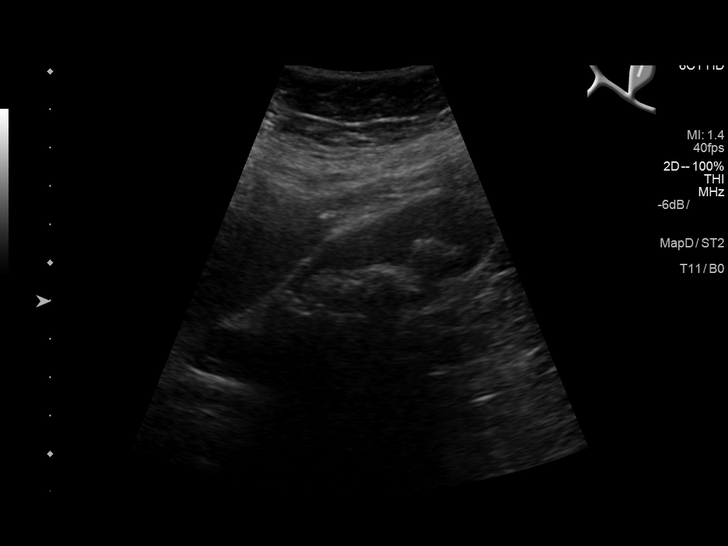
[im 13/39]
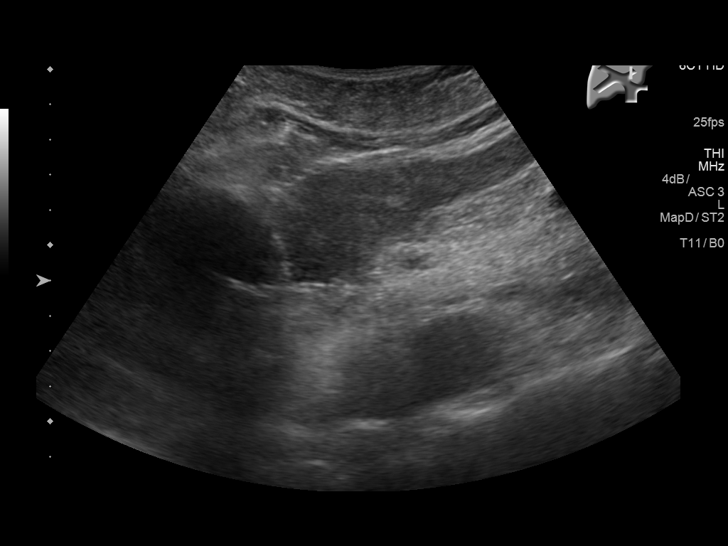
[im 15/39]
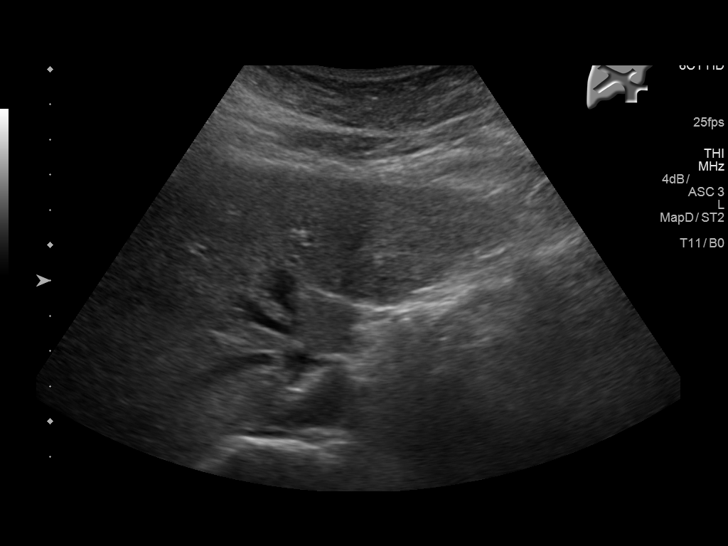
[im 18/39]
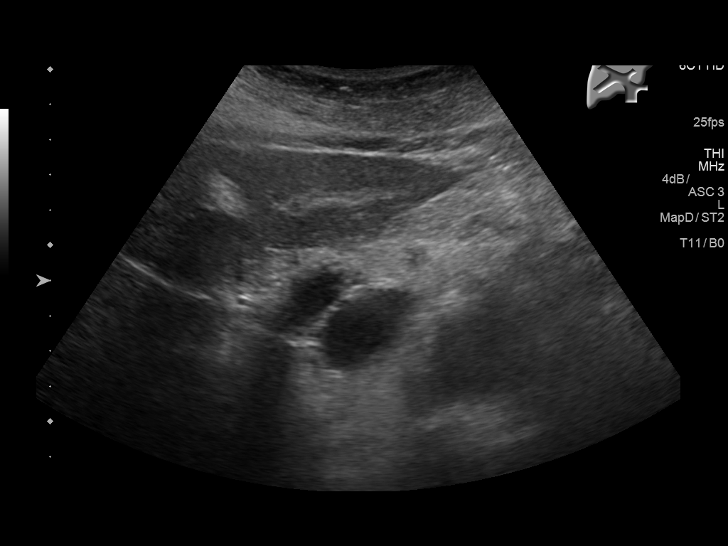
[im 21/39]
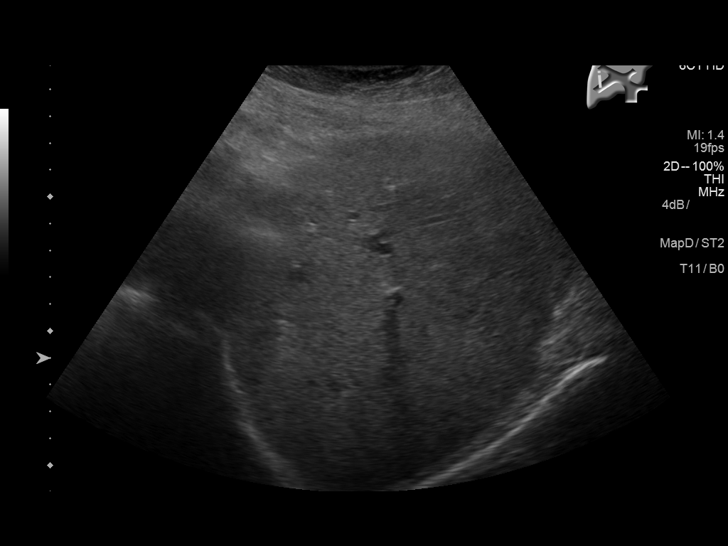
[im 24/39]
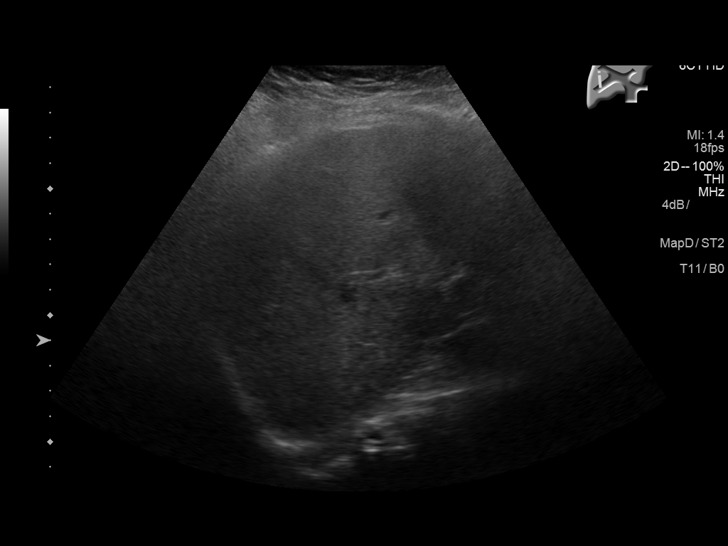
[im 26/39]
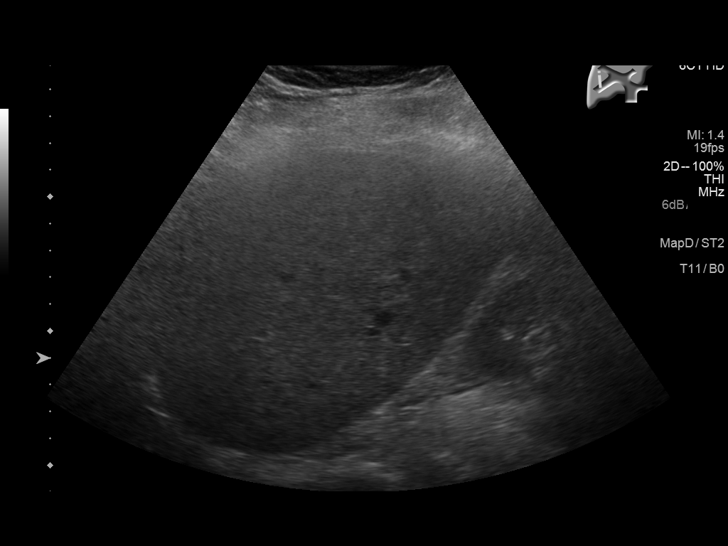
[im 29/39]
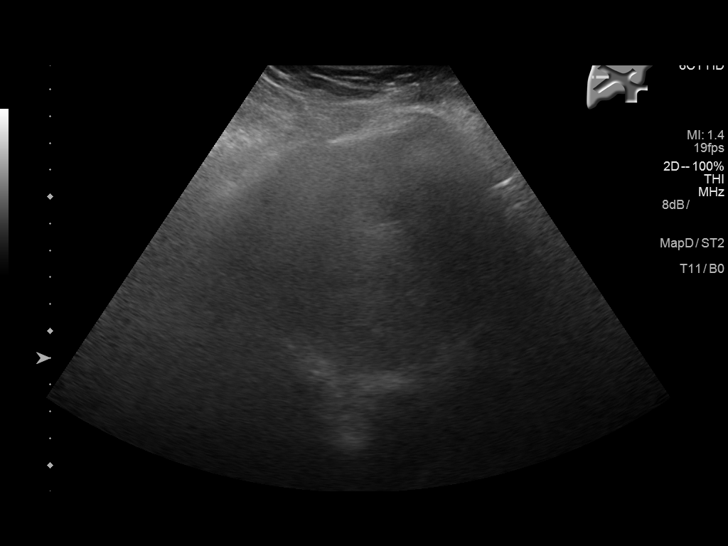
[im 32/39]
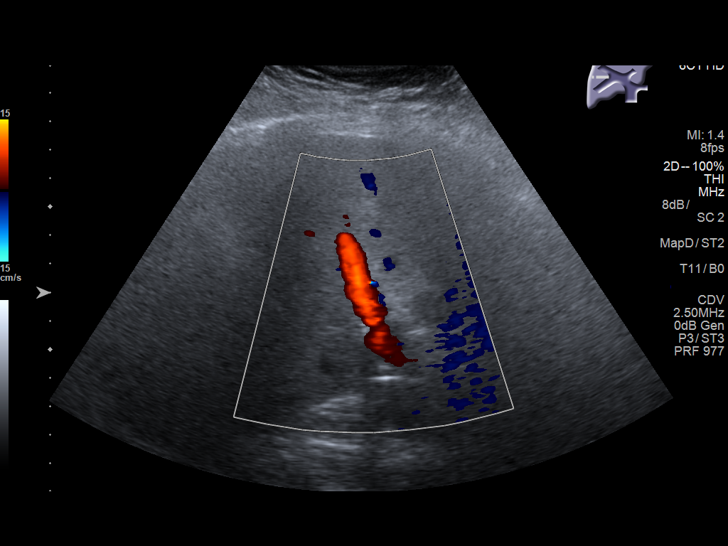
[im 35/39]
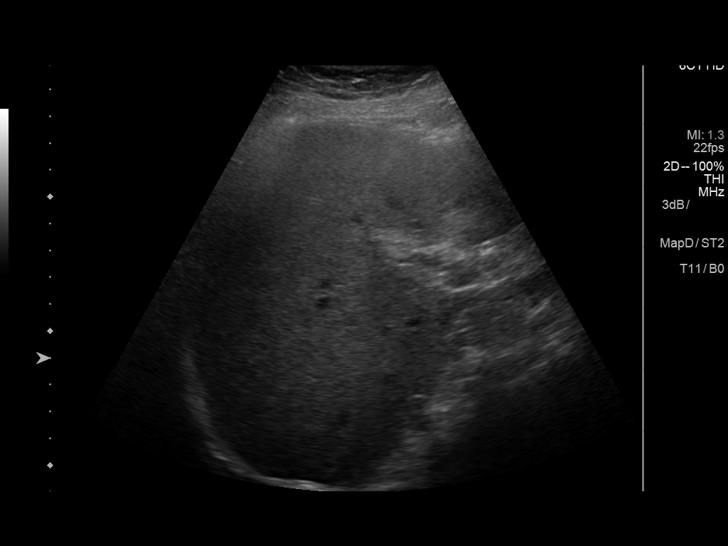
[im 39/39]
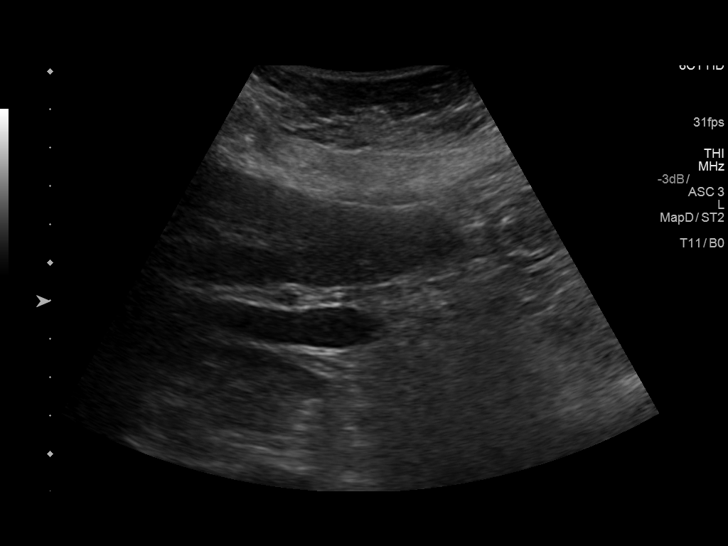

[14 of 25 positions shown; findings below may reference images not displayed]

FINDINGS: Gallbladder:

Multiple gallstones dependent within the gallbladder. Largest stone
1.5 cm in diameter. Gallbladder wall thickness upper limits of
normal. Negative Murphy sign.

Common bile duct:

Diameter: 2 mm, normal

Liver:

Mildly echogenic suggesting mild fatty change.  No focal lesion.
IMPRESSION: Multiple gallstones. Borderline gallbladder wall thickening.
Negative Murphy sign. No ductal dilatation.
